# Patient Record
Sex: Female | Born: 1940
Health system: Southern US, Community
[De-identification: ages and names within clinical notes are randomized; demographics above are authoritative.]

## PROBLEM LIST (undated history)

## (undated) DIAGNOSIS — C50911 Malignant neoplasm of unspecified site of right female breast: Secondary | ICD-10-CM

## (undated) DIAGNOSIS — F419 Anxiety disorder, unspecified: Secondary | ICD-10-CM

## (undated) DIAGNOSIS — M1990 Unspecified osteoarthritis, unspecified site: Secondary | ICD-10-CM

## (undated) DIAGNOSIS — Z9221 Personal history of antineoplastic chemotherapy: Secondary | ICD-10-CM

## (undated) DIAGNOSIS — Z973 Presence of spectacles and contact lenses: Secondary | ICD-10-CM

## (undated) DIAGNOSIS — R002 Palpitations: Secondary | ICD-10-CM

## (undated) DIAGNOSIS — N393 Stress incontinence (female) (male): Secondary | ICD-10-CM

## (undated) DIAGNOSIS — Z1379 Encounter for other screening for genetic and chromosomal anomalies: Secondary | ICD-10-CM

## (undated) DIAGNOSIS — Z8489 Family history of other specified conditions: Secondary | ICD-10-CM

## (undated) DIAGNOSIS — Z803 Family history of malignant neoplasm of breast: Secondary | ICD-10-CM

## (undated) DIAGNOSIS — Z8042 Family history of malignant neoplasm of prostate: Secondary | ICD-10-CM

## (undated) DIAGNOSIS — F411 Generalized anxiety disorder: Secondary | ICD-10-CM

## (undated) DIAGNOSIS — N993 Prolapse of vaginal vault after hysterectomy: Secondary | ICD-10-CM

## (undated) DIAGNOSIS — N816 Rectocele: Secondary | ICD-10-CM

## (undated) DIAGNOSIS — H9192 Unspecified hearing loss, left ear: Secondary | ICD-10-CM

## (undated) DIAGNOSIS — L309 Dermatitis, unspecified: Secondary | ICD-10-CM

## (undated) HISTORY — PX: CARPAL TUNNEL RELEASE: SHX101

## (undated) HISTORY — DX: Family history of malignant neoplasm of prostate: Z80.42

## (undated) HISTORY — DX: Dermatitis, unspecified: L30.9

## (undated) HISTORY — PX: TUBAL LIGATION: SHX77

## (undated) HISTORY — DX: Anxiety disorder, unspecified: F41.9

## (undated) HISTORY — DX: Family history of malignant neoplasm of breast: Z80.3

## (undated) HISTORY — DX: Encounter for other screening for genetic and chromosomal anomalies: Z13.79

## (undated) HISTORY — PX: REDUCTION MAMMAPLASTY: SUR839

## (undated) HISTORY — PX: COLONOSCOPY: SHX5424

## (undated) HISTORY — PX: VAGINAL HYSTERECTOMY: SUR661

---

## 1999-08-18 ENCOUNTER — Encounter: Payer: Self-pay | Admitting: Obstetrics and Gynecology

## 1999-08-18 ENCOUNTER — Encounter: Admission: RE | Admit: 1999-08-18 | Discharge: 1999-08-18 | Payer: Self-pay | Admitting: Obstetrics and Gynecology

## 1999-10-06 ENCOUNTER — Emergency Department (HOSPITAL_COMMUNITY): Admission: EM | Admit: 1999-10-06 | Discharge: 1999-10-06 | Payer: Self-pay | Admitting: Emergency Medicine

## 2000-02-09 ENCOUNTER — Encounter (INDEPENDENT_AMBULATORY_CARE_PROVIDER_SITE_OTHER): Payer: Self-pay

## 2000-02-09 ENCOUNTER — Inpatient Hospital Stay (HOSPITAL_COMMUNITY): Admission: RE | Admit: 2000-02-09 | Discharge: 2000-02-10 | Payer: Self-pay | Admitting: Obstetrics and Gynecology

## 2000-02-09 HISTORY — PX: VAGINAL HYSTERECTOMY: SUR661

## 2000-08-09 ENCOUNTER — Other Ambulatory Visit: Admission: RE | Admit: 2000-08-09 | Discharge: 2000-08-09 | Payer: Self-pay | Admitting: Obstetrics and Gynecology

## 2000-08-24 ENCOUNTER — Encounter: Admission: RE | Admit: 2000-08-24 | Discharge: 2000-08-24 | Payer: Self-pay | Admitting: Obstetrics and Gynecology

## 2000-08-24 ENCOUNTER — Encounter: Payer: Self-pay | Admitting: Obstetrics and Gynecology

## 2001-08-29 ENCOUNTER — Encounter: Admission: RE | Admit: 2001-08-29 | Discharge: 2001-08-29 | Payer: Self-pay | Admitting: Obstetrics and Gynecology

## 2001-08-29 ENCOUNTER — Encounter: Payer: Self-pay | Admitting: Obstetrics and Gynecology

## 2001-10-20 ENCOUNTER — Other Ambulatory Visit: Admission: RE | Admit: 2001-10-20 | Discharge: 2001-10-20 | Payer: Self-pay | Admitting: Obstetrics and Gynecology

## 2003-06-27 ENCOUNTER — Other Ambulatory Visit: Admission: RE | Admit: 2003-06-27 | Discharge: 2003-06-27 | Payer: Self-pay | Admitting: Obstetrics and Gynecology

## 2003-07-03 ENCOUNTER — Ambulatory Visit (HOSPITAL_COMMUNITY): Admission: RE | Admit: 2003-07-03 | Discharge: 2003-07-03 | Payer: Self-pay | Admitting: Obstetrics and Gynecology

## 2004-10-03 ENCOUNTER — Ambulatory Visit: Payer: Self-pay | Admitting: Internal Medicine

## 2004-10-28 ENCOUNTER — Ambulatory Visit: Payer: Self-pay | Admitting: Internal Medicine

## 2004-10-29 ENCOUNTER — Ambulatory Visit (HOSPITAL_COMMUNITY): Admission: RE | Admit: 2004-10-29 | Discharge: 2004-10-29 | Payer: Self-pay | Admitting: Internal Medicine

## 2004-11-17 ENCOUNTER — Encounter: Admission: RE | Admit: 2004-11-17 | Discharge: 2004-11-17 | Payer: Self-pay | Admitting: Internal Medicine

## 2004-12-01 ENCOUNTER — Ambulatory Visit: Payer: Self-pay | Admitting: Internal Medicine

## 2005-03-04 ENCOUNTER — Ambulatory Visit: Payer: Self-pay | Admitting: Internal Medicine

## 2005-04-01 ENCOUNTER — Ambulatory Visit: Payer: Self-pay | Admitting: Internal Medicine

## 2005-04-30 ENCOUNTER — Ambulatory Visit: Payer: Self-pay | Admitting: *Deleted

## 2005-10-02 ENCOUNTER — Ambulatory Visit: Payer: Self-pay | Admitting: Internal Medicine

## 2006-01-26 ENCOUNTER — Ambulatory Visit: Payer: Self-pay | Admitting: Internal Medicine

## 2006-02-10 ENCOUNTER — Ambulatory Visit (HOSPITAL_COMMUNITY): Admission: RE | Admit: 2006-02-10 | Discharge: 2006-02-10 | Payer: Self-pay | Admitting: Internal Medicine

## 2006-03-15 ENCOUNTER — Ambulatory Visit: Payer: Self-pay | Admitting: Internal Medicine

## 2006-04-09 ENCOUNTER — Ambulatory Visit: Payer: Self-pay | Admitting: Internal Medicine

## 2006-04-13 HISTORY — PX: CARPAL TUNNEL RELEASE: SHX101

## 2006-06-17 ENCOUNTER — Ambulatory Visit: Payer: Self-pay | Admitting: Internal Medicine

## 2006-08-06 ENCOUNTER — Ambulatory Visit: Payer: Self-pay | Admitting: Internal Medicine

## 2006-12-29 ENCOUNTER — Encounter (INDEPENDENT_AMBULATORY_CARE_PROVIDER_SITE_OTHER): Payer: Self-pay | Admitting: *Deleted

## 2006-12-31 DIAGNOSIS — L719 Rosacea, unspecified: Secondary | ICD-10-CM | POA: Insufficient documentation

## 2006-12-31 DIAGNOSIS — M797 Fibromyalgia: Secondary | ICD-10-CM

## 2006-12-31 DIAGNOSIS — G4730 Sleep apnea, unspecified: Secondary | ICD-10-CM | POA: Insufficient documentation

## 2006-12-31 DIAGNOSIS — M26629 Arthralgia of temporomandibular joint, unspecified side: Secondary | ICD-10-CM | POA: Insufficient documentation

## 2006-12-31 DIAGNOSIS — M2550 Pain in unspecified joint: Secondary | ICD-10-CM | POA: Insufficient documentation

## 2006-12-31 DIAGNOSIS — F329 Major depressive disorder, single episode, unspecified: Secondary | ICD-10-CM | POA: Insufficient documentation

## 2006-12-31 DIAGNOSIS — G5600 Carpal tunnel syndrome, unspecified upper limb: Secondary | ICD-10-CM | POA: Insufficient documentation

## 2007-11-16 ENCOUNTER — Ambulatory Visit: Payer: Self-pay | Admitting: Vascular Surgery

## 2010-08-26 NOTE — Consult Note (Signed)
NEW PATIENT CONSULTATION   KARE, DADO  DOB:  24-Jun-1940                                       11/16/2007  ZOXWR#:[id number]   The patient presents today for evaluation of lower extremity edema.  She  is a pleasant 70 year old white female who complains of pain over her  right medial knee and popliteal fossa at the level of spider vein  telangiectasia.  She has had no prior history of deep venous thrombosis  or bleeding from her varicosities.  She does not have any swelling.   PAST MEDICAL HISTORY:  Her past history is significant for carpal tunnel  surgery and hysterectomy.  She does not have any other major medical  difficulties, specifically cardiac disease.  She has had prior treatment  of telangiectasia with what sounds like hypertonic saline since she did  have some discomfort associated with this.  She did not have any  untoward events at that time.   PHYSICAL EXAMINATION:  Constitutional:  A well-developed, well-nourished  white female appearing her stated age of 38.  Vital signs:  Her blood  pressure 118/78, pulse 84, respirations 18.  Vascular:  Her dorsalis  pedis pulses are 2+ bilaterally.  She does not have any leg swelling and  does not have any varicosities or changes of venous hypertension.  She  does have telangiectasia most particularly over the right medial knee  area.  She does have scattered smaller telangiectasis over both legs.   She underwent screening handheld duplex by me and this showed no  evidence of reflux in her saphenous vein.  I discussed the significance  of this with the patient.  I explained that in all likelihood her pain  would not be related to the telangiectasia and I suspect that these are  just simply in the same location as her pain.  I did explain the  treatment for the telangiectasia from a cosmetic standpoint would be  sclerotherapy which would not be covered by insurance.  I explained our  expected cost of  this and she wished to proceed with sclerotherapy  today.  After informed consent with 0.3% sodium tetradecyl I injected 2  mL in the most prominent tributary telangiectasia.  She had no immediate  complication and will be seen again in 1 month for continued followup.   Larina Earthly, M.D.  Electronically Signed   TFE/MEDQ  D:  11/16/2007  T:  11/17/2007  Job:  [id number]

## 2010-08-29 NOTE — H&P (Signed)
[facility]  Patient:    Lauren Holt, Lauren Holt                      MRN: [id number] Adm. Date:  02/09/00 Attending:  Erie Noe P. Pennie Rushing, M.D. Dictator:   Henreitta Leber, P.A.-C.                         History and Physical  DATE OF BIRTH:  [birth year]  HISTORY OF PRESENT ILLNESS:  This is 70 year old married white female, para 3-0-0-3 with a two-year history of symptomatic cystocele characterized by urinary hesitancy, pelvic pressure, nocturia, and occasional stress urinary incontinence.  Patient was evaluated by a urologist, Dr. Patsi Sears, for her symptoms and was advised to pursue surgical repair which would be best preserved with a hysterectomy done simultaneously.  Patient has therefore consented for both procedures.  PAST MEDICAL HISTORY:  OB history:  Patient had three full-term deliveries in 1973, 1974, and 1979 with her largest child weighing 9 pounds, 12 ounces. Gynecological history:   Menarche at 70 years of age.  Patients menstrual periods were regular until menopause.  She has no history of abnormal Pap smears with her last Pap smear in April 2001 returning normal as was her mammogram in May 2001 which also was normal.  Past surgical history: Bilateral tubal ligation.  Medical history:  Positive for depression and anxiety both responsive to Paxil and sinus infections.  FAMILY HISTORY:  Positive for heart disease, kidney infections, tuberculosis, diabetes, and rheumatoid arthritis.  CURRENT MEDICATIONS: 1. Estrace 1 mg per day. 2. Provera 2.5 mg per day. 3. Premarin vaginal cream one applicatorful per vagina daily. 4. Patient is currently taking an antibiotic of unknown name twice daily for    sinus infection. 5. A decongestant which she does not recall the name daily for sinus    congestion. 6. Paxil 20 mg.  ALLERGIES:  Patient has no known drug allergies.  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  HABITS:  She exercises regularly.  Her diet is regular.  She does not use tobacco or recreational drugs.  She drinks alcohol socially.  PHYSICAL EXAMINATION:  This is a well-nourished, well-developed white female in no acute distress.  REVIEW OF SYSTEMS:  Positive for insomnia, vaginal itching, sinus pressure, but negative for urinary urgency, frequency, hematuria, or recurrent urinary tract infections.  PHYSICAL EXAMINATION:  VITAL SIGNS:  Blood pressure is 122/74, weight is 148, height is 5 feet, 4-1/2 inches tall.  EAR, NOSE, AND THROAT:  Normal with the exception of left tympanic membrane which is retracted.  NECK:  Supple without masses or adenopathy.  LUNGS:  Without wheezing, rales, or rhonchi.  HEART:  Regular rate and rhythm.  ABDOMEN:  Bowel sounds are present.  It is soft, nontender.  BACK:  Without CVA tenderness.  PELVIC:  EGBUS is within normal limits.  Vagina is slightly atrophic with a second-degree cystocele and clumpy white vaginal discharge.  Cervix is without gross lesions.  There is a second-degree uterine descensus.  Uterus appears normal size, shape, consistency.  It is nontender and anterior.  Adnexa without palpable masses or tenderness.  Rectovaginal without masses or tenderness.  LABORATORY DATA:  Patient had a wet prep which revealed budding yeast.  IMPRESSION: 1. Symptomatic cystocele. 2. Candida vaginitis.  DISPOSITION:  A long discussion was held with the patient and Dr. Dierdre Forth concerning indications for hysterectomy as well  as the risks involved which include but are not limited to anesthesia, bleeding, infection, damage to adjacent organs, and possible recurrence of prolapse with loss of support. The patient seems to understand and wishes to proceed with total vaginal hysterectomy, bilateral salpingo-oophorectomy, and urologic repair - and she has consented for the same.  Patient was given Gynazole-I vaginal cream  to use one applicatorful per vagina stat for her yeast infection.  She will present at [facility] on Monday, October 29, for her procedures. DD:  02/06/00 TD:  02/06/00 Job: [id number]

## 2010-08-29 NOTE — Op Note (Signed)
[facility]  Patient:    Lauren Holt, Lauren Holt                      MRN: [id number] Adm. Date:  [id number] Attending:  Shaune Spittle                           Operative Report  NO DICTATION. DD:  02/09/00 TD:  02/09/00 Job: [id number]

## 2010-08-29 NOTE — Op Note (Signed)
[facility]  Patient:    Lauren Holt, Lauren Holt                      MRN: [id number] Proc. Date: 02/09/00 Adm. Date:  [id number] Attending:  Shaune Spittle CC:         Dr. Lupe Carney  Maris Berger. Pennie Rushing, M.D.   Operative Report  PREOPERATIVE DIAGNOSIS:  Pelvic relaxation with urinary incontinence.  POSTOPERATIVE DIAGNOSIS:  Pelvic relaxation with urinary incontinence.  OPERATION:  Anterior vaginal wall repair and pubovaginal sling.  SURGEON:  Sigmund I. Patsi Sears, M.D.  ANESTHESIA:  General endotracheal.  DESCRIPTION OF PROCEDURE:  With the patient in the dorsolithotomy position, the patient underwent vaginal hysterectomy per Dr. Pennie Rushing.  Following vaginal hysterectomy, the vaginal cuff was identified, and no bleeding was noted.  Ten cubic centimeters of 0.5 Marcaine solution with epinephrine 1:200,000 was injected into the pubovaginal cervical arch tissue and following preparation of the 4 cm x 7 cm Tutoplast fascia, an incision was made in a semilunar manner from the 8 oclock to the 12 oclock to the 4 oclock position.  The subcutaneous tissue was dissected with sharp and blunt dissection, and following this, the retropubic space was dissected bilaterally.  Using the transvaginal anchor system, the anchor and #1 Prolene sutures were placed, and the preprepared 7 cm portion of the Tutoplast fascia was then back loaded on to the sutures and placed as a fascial sling.  The bib portion of the fascia was then brought down over the anterior repair and sutured in place with 3-0 Vicryl suture.  The sling was ligated in place with care taken to avoid any over tightening of the sling, as demonstrated with a right angle clamp.  The wound was irrigated with antibiotic irrigation and closed in multiple layers with 3-0 Vicryl suture.  The knots were buried for the patients benefit.  Following closure, the vagina was packed with 2 inch Iodoform  packing and Estrace cream.  Hysteroscopy was accomplished and showed no suture within the bladder.  The bladder neck was opened.  The patient tolerated the procedure well, was awakened and given IV Toradol, and taken to the recovery room in good condition. DD:  02/09/00 TD:  02/09/00 Job: [id number]

## 2010-08-29 NOTE — Discharge Summary (Signed)
[facility]  Patient:    Lauren Holt, Lauren Holt                      MRN: [id number] Adm. Date:  [id number] Disc. Date: [id number] Attending:  Shaune Spittle Dictator:   Henreitta Leber, P.A.                           Discharge Summary  DISCHARGE DIAGNOSES: 1. Pelvic relaxation. 2. Urinary incontinence. 3. Pelvic adhesions.  PROCEDURE:  On date of admission the patient underwent a total vaginal hysterectomy with lysis of adhesions by Dr. Maris Berger. Haygood and the placement of a pubovaginal sling by Dr. Jethro Bolus.  HISTORY OF PRESENT ILLNESS:  This is a 70 year old married white female, para 3-0-0-3 with a two-year history of a symptomatic cystocele characterized by urinary hesitancy, pelvic pressure, nocturia, and occasional stress urinary incontinence.  The patient has consented to undergo a total vaginal hysterectomy with the placement of a pubovaginal sling in an effort to alleviate her symptoms.  Please see the patients dictated History and Physical Examination for details.  PHYSICAL EXAMINATION ON ADMISSION:  VITAL SIGNS:  Blood pressure is 122/74, weight is 148, height is 5 feet 4-1/2 inches tall.  GENERAL:  General exam was within normal limits.  PELVIC:  EGBUS within normal limits.  Vagina is slightly atrophic with second degree cystocele and clumpy white vaginal discharge.  Cervix is without gross lesions and a second degree uterine descensus.  Uterus normal size, shape, consistency, nontender, anterior.  Adnexa without tenderness or masses. Rectovaginal without tenderness or masses.  HOSPITAL COURSE:  On day of admission the patient underwent a total vaginal hysterectomy with lysis of adhesions and placement of a pubovaginal tolerating all procedures well.  Her postoperative course was unremarkable with her quickly resuming bowel and bladder function by postop day #2 and deemed ready for discharge.  Postop hemoglobin was 10  (preop hemoglobin 13.1).  DISCHARGE MEDICATIONS: 1. Vicodin one to two tablets q.4h. p.r.n. pain. 2. Ferrous sulfate 325 mg one tablet q.d. 3. Colace as directed. 4. Usual medications.  FOLLOWUP:  The patient is to be contacted by Dr. Hollie Beach office to schedule a follow-up visit.  She is to follow up with Dr. Maris Berger. Haygood on March 22, 2000, at 2:30 p.m. for her postoperative exam.  DISCHARGE INSTRUCTIONS:  The patient was given a copy of [facility] postoperative instruction sheet.  She  was further advised to avoid driving for two weeks, heavy lifting for four weeks, and intercourse for six weeks.  The patient also was reminded that she may call the office at any time for any questions or concerns.  LABORATORY:  Final pathology was not available at the time of patients discharge. DD:  02/10/00 TD:  02/11/00 Job: [id number]

## 2011-10-06 ENCOUNTER — Encounter: Payer: Self-pay | Admitting: Vascular Surgery

## 2012-02-16 ENCOUNTER — Other Ambulatory Visit: Payer: Self-pay | Admitting: Family Medicine

## 2012-02-16 DIAGNOSIS — Z1231 Encounter for screening mammogram for malignant neoplasm of breast: Secondary | ICD-10-CM

## 2012-03-22 ENCOUNTER — Ambulatory Visit
Admission: RE | Admit: 2012-03-22 | Discharge: 2012-03-22 | Disposition: A | Discharge status: Home / Self Care | Payer: Medicare Other | Source: Ambulatory Visit | Attending: Family Medicine | Admitting: Family Medicine

## 2012-03-22 DIAGNOSIS — Z1231 Encounter for screening mammogram for malignant neoplasm of breast: Secondary | ICD-10-CM

## 2012-03-28 ENCOUNTER — Other Ambulatory Visit: Payer: Self-pay | Admitting: Family Medicine

## 2012-03-28 DIAGNOSIS — R928 Other abnormal and inconclusive findings on diagnostic imaging of breast: Secondary | ICD-10-CM

## 2012-04-11 ENCOUNTER — Other Ambulatory Visit: Payer: Medicare Other

## 2012-04-19 ENCOUNTER — Ambulatory Visit: Payer: Medicare Other

## 2012-04-19 ENCOUNTER — Ambulatory Visit
Admission: RE | Admit: 2012-04-19 | Discharge: 2012-04-19 | Disposition: A | Discharge status: Home / Self Care | Payer: Medicare Other | Source: Ambulatory Visit | Attending: Family Medicine | Admitting: Family Medicine

## 2012-04-19 DIAGNOSIS — R928 Other abnormal and inconclusive findings on diagnostic imaging of breast: Secondary | ICD-10-CM

## 2012-06-08 ENCOUNTER — Ambulatory Visit: Payer: Medicare Other

## 2012-06-13 ENCOUNTER — Encounter: Payer: Self-pay | Admitting: Surgery

## 2012-06-20 ENCOUNTER — Encounter: Payer: Self-pay | Admitting: Surgery

## 2012-07-05 ENCOUNTER — Encounter: Payer: Self-pay | Admitting: *Deleted

## 2012-07-06 ENCOUNTER — Ambulatory Visit (INDEPENDENT_AMBULATORY_CARE_PROVIDER_SITE_OTHER): Payer: Medicare Other | Admitting: *Deleted

## 2012-07-06 DIAGNOSIS — I781 Nevus, non-neoplastic: Secondary | ICD-10-CM

## 2012-07-06 NOTE — Progress Notes (Signed)
X=.3% Sotradecol administered with a 27g butterfly.  Patient received a total of 6cc.  Treated all areas of concern with one syringe. Tol well. Easy access for majority of the vessels. Antic good results. Follow prn.  Photos: yes  Compression stockings applied: yes

## 2014-05-28 ENCOUNTER — Other Ambulatory Visit (HOSPITAL_COMMUNITY): Payer: Self-pay | Admitting: Family Medicine

## 2014-05-28 DIAGNOSIS — R102 Pelvic and perineal pain: Secondary | ICD-10-CM

## 2014-05-28 DIAGNOSIS — R1032 Left lower quadrant pain: Secondary | ICD-10-CM | POA: Diagnosis not present

## 2014-05-29 ENCOUNTER — Ambulatory Visit (HOSPITAL_COMMUNITY)
Admission: RE | Admit: 2014-05-29 | Discharge: 2014-05-29 | Disposition: A | Discharge status: Home / Self Care | Payer: Commercial Managed Care - HMO | Source: Ambulatory Visit | Attending: Family Medicine | Admitting: Family Medicine

## 2014-05-29 DIAGNOSIS — R102 Pelvic and perineal pain: Secondary | ICD-10-CM | POA: Diagnosis not present

## 2014-05-29 DIAGNOSIS — Z90710 Acquired absence of both cervix and uterus: Secondary | ICD-10-CM | POA: Insufficient documentation

## 2014-08-09 DIAGNOSIS — J321 Chronic frontal sinusitis: Secondary | ICD-10-CM | POA: Diagnosis not present

## 2014-09-07 ENCOUNTER — Other Ambulatory Visit: Payer: Self-pay | Admitting: Family Medicine

## 2014-09-07 DIAGNOSIS — N63 Unspecified lump in unspecified breast: Secondary | ICD-10-CM

## 2014-09-07 DIAGNOSIS — N6001 Solitary cyst of right breast: Secondary | ICD-10-CM

## 2014-09-07 DIAGNOSIS — L719 Rosacea, unspecified: Secondary | ICD-10-CM | POA: Diagnosis not present

## 2014-09-07 DIAGNOSIS — N61 Inflammatory disorders of breast: Secondary | ICD-10-CM | POA: Diagnosis not present

## 2014-09-07 DIAGNOSIS — N644 Mastodynia: Secondary | ICD-10-CM

## 2014-09-24 ENCOUNTER — Ambulatory Visit
Admission: RE | Admit: 2014-09-24 | Discharge: 2014-09-24 | Disposition: A | Discharge status: Home / Self Care | Payer: Commercial Managed Care - HMO | Source: Ambulatory Visit | Attending: Family Medicine | Admitting: Family Medicine

## 2014-09-24 DIAGNOSIS — N644 Mastodynia: Secondary | ICD-10-CM | POA: Diagnosis not present

## 2014-09-24 DIAGNOSIS — N63 Unspecified lump in unspecified breast: Secondary | ICD-10-CM

## 2014-09-24 DIAGNOSIS — N6001 Solitary cyst of right breast: Secondary | ICD-10-CM

## 2014-09-24 DIAGNOSIS — R928 Other abnormal and inconclusive findings on diagnostic imaging of breast: Secondary | ICD-10-CM | POA: Diagnosis not present

## 2014-10-05 DIAGNOSIS — N61 Inflammatory disorders of breast: Secondary | ICD-10-CM | POA: Diagnosis not present

## 2014-11-19 DIAGNOSIS — N644 Mastodynia: Secondary | ICD-10-CM | POA: Diagnosis not present

## 2014-11-19 DIAGNOSIS — R300 Dysuria: Secondary | ICD-10-CM | POA: Diagnosis not present

## 2014-11-22 ENCOUNTER — Other Ambulatory Visit: Payer: Commercial Managed Care - HMO

## 2014-11-23 ENCOUNTER — Other Ambulatory Visit: Payer: Commercial Managed Care - HMO

## 2014-11-26 ENCOUNTER — Other Ambulatory Visit: Payer: Commercial Managed Care - HMO

## 2014-11-29 ENCOUNTER — Ambulatory Visit
Admission: RE | Admit: 2014-11-29 | Discharge: 2014-11-29 | Disposition: A | Discharge status: Home / Self Care | Payer: Commercial Managed Care - HMO | Source: Ambulatory Visit | Attending: Family Medicine | Admitting: Family Medicine

## 2014-11-29 ENCOUNTER — Other Ambulatory Visit: Payer: Self-pay | Admitting: Family Medicine

## 2014-11-29 DIAGNOSIS — N6001 Solitary cyst of right breast: Secondary | ICD-10-CM

## 2014-11-29 DIAGNOSIS — N644 Mastodynia: Secondary | ICD-10-CM | POA: Diagnosis not present

## 2014-11-29 DIAGNOSIS — N63 Unspecified lump in unspecified breast: Secondary | ICD-10-CM

## 2014-12-12 DIAGNOSIS — R350 Frequency of micturition: Secondary | ICD-10-CM | POA: Diagnosis not present

## 2015-03-13 DIAGNOSIS — Z23 Encounter for immunization: Secondary | ICD-10-CM | POA: Diagnosis not present

## 2015-03-13 DIAGNOSIS — N649 Disorder of breast, unspecified: Secondary | ICD-10-CM | POA: Diagnosis not present

## 2015-03-13 DIAGNOSIS — F419 Anxiety disorder, unspecified: Secondary | ICD-10-CM | POA: Diagnosis not present

## 2015-03-18 ENCOUNTER — Ambulatory Visit: Payer: Self-pay | Admitting: Surgery

## 2015-03-18 DIAGNOSIS — N644 Mastodynia: Secondary | ICD-10-CM | POA: Diagnosis not present

## 2015-03-26 ENCOUNTER — Other Ambulatory Visit: Payer: Self-pay | Admitting: Surgery

## 2015-03-26 DIAGNOSIS — N644 Mastodynia: Secondary | ICD-10-CM

## 2015-03-26 DIAGNOSIS — R609 Edema, unspecified: Secondary | ICD-10-CM

## 2015-04-16 ENCOUNTER — Inpatient Hospital Stay: Admission: RE | Admit: 2015-04-16 | Payer: Commercial Managed Care - HMO | Source: Ambulatory Visit

## 2015-06-24 DIAGNOSIS — J329 Chronic sinusitis, unspecified: Secondary | ICD-10-CM | POA: Diagnosis not present

## 2015-07-12 DIAGNOSIS — J309 Allergic rhinitis, unspecified: Secondary | ICD-10-CM | POA: Diagnosis not present

## 2015-07-12 DIAGNOSIS — R350 Frequency of micturition: Secondary | ICD-10-CM | POA: Diagnosis not present

## 2015-07-12 DIAGNOSIS — N3000 Acute cystitis without hematuria: Secondary | ICD-10-CM | POA: Diagnosis not present

## 2015-07-12 DIAGNOSIS — L719 Rosacea, unspecified: Secondary | ICD-10-CM | POA: Diagnosis not present

## 2015-08-06 DIAGNOSIS — R300 Dysuria: Secondary | ICD-10-CM | POA: Diagnosis not present

## 2015-08-06 DIAGNOSIS — L719 Rosacea, unspecified: Secondary | ICD-10-CM | POA: Diagnosis not present

## 2015-08-06 DIAGNOSIS — R8290 Unspecified abnormal findings in urine: Secondary | ICD-10-CM | POA: Diagnosis not present

## 2015-09-02 ENCOUNTER — Inpatient Hospital Stay: Admission: RE | Admit: 2015-09-02 | Payer: Commercial Managed Care - HMO | Source: Ambulatory Visit

## 2015-09-02 ENCOUNTER — Other Ambulatory Visit: Payer: Self-pay

## 2015-09-02 DIAGNOSIS — Z1231 Encounter for screening mammogram for malignant neoplasm of breast: Secondary | ICD-10-CM

## 2015-09-23 DIAGNOSIS — R300 Dysuria: Secondary | ICD-10-CM | POA: Diagnosis not present

## 2015-09-23 DIAGNOSIS — N3000 Acute cystitis without hematuria: Secondary | ICD-10-CM | POA: Diagnosis not present

## 2015-09-26 ENCOUNTER — Ambulatory Visit
Admission: RE | Admit: 2015-09-26 | Discharge: 2015-09-26 | Disposition: A | Discharge status: Home / Self Care | Payer: Commercial Managed Care - HMO | Source: Ambulatory Visit

## 2015-09-26 ENCOUNTER — Other Ambulatory Visit: Payer: Self-pay | Admitting: Family Medicine

## 2015-09-26 ENCOUNTER — Ambulatory Visit: Payer: Commercial Managed Care - HMO

## 2015-09-26 DIAGNOSIS — Z1231 Encounter for screening mammogram for malignant neoplasm of breast: Secondary | ICD-10-CM

## 2015-09-30 ENCOUNTER — Ambulatory Visit
Admission: RE | Admit: 2015-09-30 | Discharge: 2015-09-30 | Disposition: A | Discharge status: Home / Self Care | Payer: Commercial Managed Care - HMO | Source: Ambulatory Visit | Attending: Surgery | Admitting: Surgery

## 2015-09-30 DIAGNOSIS — R609 Edema, unspecified: Secondary | ICD-10-CM

## 2015-09-30 DIAGNOSIS — N63 Unspecified lump in breast: Secondary | ICD-10-CM | POA: Diagnosis not present

## 2015-09-30 DIAGNOSIS — N644 Mastodynia: Secondary | ICD-10-CM

## 2015-09-30 MED ORDER — GADOBENATE DIMEGLUMINE 529 MG/ML IV SOLN
17.0000 mL | Freq: Once | INTRAVENOUS | Status: AC | PRN
  Administered 2015-09-30: 17 mL via INTRAVENOUS

## 2015-11-13 DIAGNOSIS — H5210 Myopia, unspecified eye: Secondary | ICD-10-CM | POA: Diagnosis not present

## 2015-11-13 DIAGNOSIS — H52203 Unspecified astigmatism, bilateral: Secondary | ICD-10-CM | POA: Diagnosis not present

## 2015-12-10 DIAGNOSIS — M545 Low back pain: Secondary | ICD-10-CM | POA: Diagnosis not present

## 2015-12-10 DIAGNOSIS — H9209 Otalgia, unspecified ear: Secondary | ICD-10-CM | POA: Diagnosis not present

## 2016-10-28 DIAGNOSIS — Z0000 Encounter for general adult medical examination without abnormal findings: Secondary | ICD-10-CM | POA: Diagnosis not present

## 2016-10-28 DIAGNOSIS — R635 Abnormal weight gain: Secondary | ICD-10-CM | POA: Diagnosis not present

## 2016-10-28 DIAGNOSIS — M8580 Other specified disorders of bone density and structure, unspecified site: Secondary | ICD-10-CM | POA: Diagnosis not present

## 2016-10-28 DIAGNOSIS — L719 Rosacea, unspecified: Secondary | ICD-10-CM | POA: Diagnosis not present

## 2016-10-28 DIAGNOSIS — F419 Anxiety disorder, unspecified: Secondary | ICD-10-CM | POA: Diagnosis not present

## 2016-10-28 DIAGNOSIS — E7800 Pure hypercholesterolemia, unspecified: Secondary | ICD-10-CM | POA: Diagnosis not present

## 2016-12-07 DIAGNOSIS — M8588 Other specified disorders of bone density and structure, other site: Secondary | ICD-10-CM | POA: Diagnosis not present

## 2017-02-01 DIAGNOSIS — Z86010 Personal history of colonic polyps: Secondary | ICD-10-CM | POA: Diagnosis not present

## 2017-03-11 DIAGNOSIS — H04123 Dry eye syndrome of bilateral lacrimal glands: Secondary | ICD-10-CM | POA: Diagnosis not present

## 2017-05-10 ENCOUNTER — Other Ambulatory Visit: Payer: Self-pay | Admitting: Family Medicine

## 2017-05-10 DIAGNOSIS — Z139 Encounter for screening, unspecified: Secondary | ICD-10-CM

## 2017-05-25 DIAGNOSIS — J069 Acute upper respiratory infection, unspecified: Secondary | ICD-10-CM | POA: Diagnosis not present

## 2017-06-11 DIAGNOSIS — C50311 Malignant neoplasm of lower-inner quadrant of right female breast: Secondary | ICD-10-CM

## 2017-06-11 HISTORY — DX: Estrogen receptor positive status (ER+): C50.311

## 2017-06-11 HISTORY — PX: BREAST BIOPSY: SHX20

## 2017-06-14 ENCOUNTER — Ambulatory Visit
Admission: RE | Admit: 2017-06-14 | Discharge: 2017-06-14 | Disposition: A | Discharge status: Home / Self Care | Payer: Commercial Managed Care - HMO | Source: Ambulatory Visit | Attending: Family Medicine | Admitting: Family Medicine

## 2017-06-14 ENCOUNTER — Other Ambulatory Visit: Payer: Self-pay | Admitting: Family Medicine

## 2017-06-14 DIAGNOSIS — Z139 Encounter for screening, unspecified: Secondary | ICD-10-CM

## 2017-06-14 DIAGNOSIS — Z1231 Encounter for screening mammogram for malignant neoplasm of breast: Secondary | ICD-10-CM | POA: Diagnosis not present

## 2017-06-16 ENCOUNTER — Other Ambulatory Visit: Payer: Self-pay | Admitting: Family Medicine

## 2017-06-16 DIAGNOSIS — R928 Other abnormal and inconclusive findings on diagnostic imaging of breast: Secondary | ICD-10-CM

## 2017-06-18 DIAGNOSIS — H2513 Age-related nuclear cataract, bilateral: Secondary | ICD-10-CM | POA: Diagnosis not present

## 2017-06-18 DIAGNOSIS — H5203 Hypermetropia, bilateral: Secondary | ICD-10-CM | POA: Diagnosis not present

## 2017-06-18 DIAGNOSIS — H52223 Regular astigmatism, bilateral: Secondary | ICD-10-CM | POA: Diagnosis not present

## 2017-06-18 DIAGNOSIS — H524 Presbyopia: Secondary | ICD-10-CM | POA: Diagnosis not present

## 2017-06-23 ENCOUNTER — Other Ambulatory Visit: Payer: Self-pay | Admitting: Family Medicine

## 2017-06-23 ENCOUNTER — Ambulatory Visit
Admission: RE | Admit: 2017-06-23 | Discharge: 2017-06-23 | Disposition: A | Discharge status: Home / Self Care | Payer: Medicare HMO | Source: Ambulatory Visit | Attending: Family Medicine | Admitting: Family Medicine

## 2017-06-23 DIAGNOSIS — R921 Mammographic calcification found on diagnostic imaging of breast: Secondary | ICD-10-CM

## 2017-06-23 DIAGNOSIS — R928 Other abnormal and inconclusive findings on diagnostic imaging of breast: Secondary | ICD-10-CM

## 2017-06-29 ENCOUNTER — Ambulatory Visit
Admission: RE | Admit: 2017-06-29 | Discharge: 2017-06-29 | Disposition: A | Discharge status: Home / Self Care | Payer: Medicare HMO | Source: Ambulatory Visit | Attending: Family Medicine | Admitting: Family Medicine

## 2017-06-29 ENCOUNTER — Other Ambulatory Visit: Payer: Self-pay | Admitting: Family Medicine

## 2017-06-29 DIAGNOSIS — R921 Mammographic calcification found on diagnostic imaging of breast: Secondary | ICD-10-CM

## 2017-06-29 DIAGNOSIS — C50111 Malignant neoplasm of central portion of right female breast: Secondary | ICD-10-CM | POA: Diagnosis not present

## 2017-06-29 DIAGNOSIS — D0511 Intraductal carcinoma in situ of right breast: Secondary | ICD-10-CM | POA: Diagnosis not present

## 2017-06-29 DIAGNOSIS — R920 Mammographic microcalcification found on diagnostic imaging of breast: Secondary | ICD-10-CM | POA: Diagnosis not present

## 2017-06-30 ENCOUNTER — Other Ambulatory Visit: Payer: Self-pay | Admitting: Family Medicine

## 2017-06-30 ENCOUNTER — Encounter: Payer: Self-pay | Admitting: *Deleted

## 2017-06-30 ENCOUNTER — Telehealth: Payer: Self-pay | Admitting: Hematology and Oncology

## 2017-06-30 DIAGNOSIS — C50911 Malignant neoplasm of unspecified site of right female breast: Secondary | ICD-10-CM

## 2017-06-30 NOTE — Telephone Encounter (Signed)
LVM for patient to confirm morning [facility] appointment for 3/27, letter given to [facility] to give to patient

## 2017-07-01 ENCOUNTER — Ambulatory Visit
Admission: RE | Admit: 2017-07-01 | Discharge: 2017-07-01 | Disposition: A | Discharge status: Home / Self Care | Payer: Medicare HMO | Source: Ambulatory Visit | Attending: Family Medicine | Admitting: Family Medicine

## 2017-07-01 DIAGNOSIS — D0511 Intraductal carcinoma in situ of right breast: Secondary | ICD-10-CM | POA: Diagnosis not present

## 2017-07-01 DIAGNOSIS — C50911 Malignant neoplasm of unspecified site of right female breast: Secondary | ICD-10-CM

## 2017-07-02 ENCOUNTER — Other Ambulatory Visit: Payer: Self-pay | Admitting: *Deleted

## 2017-07-02 DIAGNOSIS — C50311 Malignant neoplasm of lower-inner quadrant of right female breast: Secondary | ICD-10-CM | POA: Insufficient documentation

## 2017-07-02 DIAGNOSIS — Z170 Estrogen receptor positive status [ER+]: Principal | ICD-10-CM

## 2017-07-06 NOTE — Progress Notes (Signed)
[facility]  [phone] [phone]) [phone]     ID: Lauren Holt DOB: 03-Aug-1940  MR#: [id number]  GTX#:[id number]  Patient Care Team: Alroy Dust, Carlean Jews.Marlou Sa, MD as PCP - General (Family Medicine) Jovita Kussmaul, MD as Consulting Physician (General Surgery) Magrinat, Virgie Dad, MD as Consulting Physician (Oncology) Kyung Rudd, MD as Consulting Physician (Radiation Oncology) OTHER MD:  CHIEF COMPLAINT: Estrogen receptor positive breast cancer  CURRENT TREATMENT: Definitive surgery pending   HISTORY OF CURRENT ILLNESS: Lauren Holt had routine screening mammography on 06/14/2017 showing a possible area of calcifications in the right breast. She underwent unilateral right diagnostic mammography with tomography at [facility] on 06/23/2017 showing: Suspicious calcifications within the inferior right breast, spanning nearly 10 cm, more superior component spanning 4 cm extent. The more superior component may have a small associated mass. Ultrasonography of the right axilla on 07/01/2017 showed normal right axillary lymph nodes.   Accordingly on 06/29/2017 she proceeded to biopsy of the right breast area in question. The pathology from this procedure showed ([id number]): In the right breast lower central, more lateral superior: invasive ductal carcinoma grade I. In the lower central, more medial inferior: Ductal carcinoma in situ, high grade, with calcifications. The  invasive tumor was significant for estrogen receptor, 100% positive and progesterone receptor, 10% positive, both with strong staining intensity. Proliferation marker Ki67 at 5%. HER2  not amplified. The ductal carcinoma in situ was significant for estrogen receptor, 100% positive and progesterone receptor, 80% positive, both with strong staining intensity.   The patient's subsequent history is as detailed below.  INTERVAL HISTORY: Lauren Holt was evaluated in the multidisciplinary breast cancer  clinic on 07/06/2017 accompanied by her daughter, Lauren Holt. Her case was also presented at the multidisciplinary breast cancer conference on the same day. At that time a preliminary plan was proposed: Biopsy of the area between the prior biopsies for definitive surgical planning; consider MRI; consider Oncotype versus hormones only depending on patient's overall status; consider adjuvant radiation   REVIEW OF SYSTEMS: There were no specific symptoms leading to the original mammogram, which was routinely scheduled. She is still deciding weather to have a lumpectomy or mastectomy. The patient denies unusual headaches, visual changes, nausea, vomiting, stiff neck, dizziness, or gait imbalance. There has been no cough, phlegm production, or pleurisy, no chest pain or pressure, and no change in bowel or bladder habits. The patient denies fever, rash, bleeding, unexplained fatigue or unexplained weight loss. A detailed review of systems was otherwise entirely negative.  PAST MEDICAL HISTORY: Past Medical History:  Diagnosis Date  . Anxiety   . Dermatitis    HLD ( but not too high)   PAST SURGICAL HISTORY: Past Surgical History:  Procedure Laterality Date  . ABDOMINAL HYSTERECTOMY     Partial Hysterectomy without BSO. Because they were too close to the bladder.    FAMILY HISTORY Family History  Problem Relation Age of Onset  . Breast cancer Paternal Aunt   . Breast cancer Cousin    The patient's father died at age 18 due to emphysema. The patient's mother is alive at age 45 as of March 2019. The patient has 5 brothers and 3 sisters. There was a paternal great aunt with breast cancer. There was also a paternal cousin with breast cancer.   GYNECOLOGIC HISTORY:  No LMP recorded. Patient has had a hysterectomy. Menarche: 77 years old Age at first live birth: 77 years old The patient is GXP3. She is s/p partial hysterectomy. Both of her  ovaries remain. She used oral contraceptives with no  complications. She also used HRT for 2 years without complications.     SOCIAL HISTORY:  Lauren Holt is now retired. 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 She is divorced. Her oldest daughter, Lauren Holt", is a [employer]th Land in [place], [place]. The patient's son, Lauren Holt, works in a Proofreader in [place]. The patient's son, Lauren Holt, works in Quarry manager in [place].  The patient has 4 grandchildren. She currently does not belong to a church.      ADVANCED DIRECTIVES: Not in place.  At the 07/07/2017 visit the patient was given the appropriate documents to complete and notarized at her discretion.  HEALTH MAINTENANCE: Social History   Tobacco Use  . Smoking status: Never Smoker  . Smokeless tobacco: Never Used  Substance Use Topics  . Alcohol use: Yes  . Drug use: Never     Colonoscopy: 02/2017 at [facility]  PAP:   Bone density: 2018/ osteopenia   No Known Allergies  Current Outpatient Medications  Medication Sig Dispense Refill  . Ascorbic Acid (VITAMIN C) 500 MG/5ML LIQD 1 tablet    . aspirin 325 MG tablet 1 tablet    . calcium gluconate in sodium chloride 0.9 % 25 mL 1 tablet after meals    . Omega-3 Fatty Acids (FISH OIL) 500 MG CAPS 1 capsule    . Pediatric Multivitamins-Fl (MULTIVITAMINS/FL PO) 1 tablet    . Red Yeast Rice 600 MG CAPS 1 or 2 capsules     No current facility-administered medications for this visit.     OBJECTIVE: Middle-aged white woman who appears well  Vitals:   07/07/17 1310  BP: 123/69  Pulse: 85  Resp: 18  Temp: 98.6 F (37 C)  SpO2: 98%     Body mass index is 30.97 kg/m.   Wt Readings from Last 3 Encounters:  07/07/17 177 lb 9.6 oz (80.6 kg)      ECOG FS:0 - Asymptomatic  Ocular: Sclerae unicteric, pupils round and equal Ear-nose-throat: Oropharynx clear and moist Lymphatic: No cervical or supraclavicular adenopathy Lungs no rales or rhonchi Heart regular rate and  rhythm Abd soft, nontender, positive bowel sounds MSK no focal spinal tenderness, no joint edema Neuro: non-focal, well-oriented, appropriate affect Breasts: The right breast shows a minimal ecchymosis.  There is no palpable mass.  The left breast is benign.  Both axillae are benign.   LAB RESULTS:  CMP  No results found for: NA, K, CL, CO2, GLUCOSE, BUN, CREATININE, CALCIUM, PROT, ALBUMIN, AST, ALT, ALKPHOS, BILITOT, GFRNONAA, GFRAA  No results found for: TOTALPROTELP, ALBUMINELP, A1GS, A2GS, BETS, BETA2SER, GAMS, MSPIKE, SPEI  No results found for: KPAFRELGTCHN, LAMBDASER, KAPLAMBRATIO  No results found for: WBC, NEUTROABS, HGB, HCT, MCV, PLT  _0 @  No results found for: LABCA2  No components found for: ALPFXT024  No results for input(s): INR in the last 168 hours.  No results found for: LABCA2  No results found for: OXB353  No results found for: GDJ242  No results found for: AST419  No results found for: CA2729  No components found for: HGQUANT  No results found for: CEA1 / No results found for: CEA1   No results found for: AFPTUMOR  No results found for: CHROMOGRNA  No results found for: PSA1  No visits with results within 3 Day(s) from this visit.  Latest known visit with results is:  No results found for any previous  visit.    (this displays the last labs from the last 3 days)  No results found for: TOTALPROTELP, ALBUMINELP, A1GS, A2GS, BETS, BETA2SER, GAMS, MSPIKE, SPEI (this displays SPEP labs)  No results found for: KPAFRELGTCHN, LAMBDASER, KAPLAMBRATIO (kappa/lambda light chains)  No results found for: HGBA, HGBA2QUANT, HGBFQUANT, HGBSQUAN (Hemoglobinopathy evaluation)   No results found for: LDH  No results found for: IRON, TIBC, IRONPCTSAT (Iron and TIBC)  No results found for: FERRITIN  Urinalysis No results found for: COLORURINE, APPEARANCEUR, LABSPEC, PHURINE, GLUCOSEU, HGBUR, BILIRUBINUR, KETONESUR, PROTEINUR,  UROBILINOGEN, NITRITE, LEUKOCYTESUR   STUDIES: Mm Digital Diagnostic Unilat R  Result Date: 06/23/2017 CLINICAL DATA:  Patient returns today to evaluate right breast calcifications identified on recent screening mammogram. EXAM: DIGITAL DIAGNOSTIC RIGHT MAMMOGRAM WITH CAD COMPARISON:  Previous exams including recent screening mammogram dated 06/14/2017. ACR Breast Density Category c: The breast tissue is heterogeneously dense, which may obscure small masses. FINDINGS: On today's additional magnification views, 2 nearby groups of suspicious calcifications are confirmed within the lower right breast, with an overall segmental distribution. The more inferior group of pleomorphic and amorphous calcifications have an overall segmental distribution spanning 9.7 cm extent from posterior to anterior depth, with a suspicious linear/ductal component posteriorly. The most suspicious components are seen at its most posterior and anterior aspects. The slightly more superior group of pleomorphic and amorphous calcifications span approximately 4 cm extent. The most suspicious component is seen at its posterior aspect, with possible small associated mass. Mammographic images were processed with CAD. IMPRESSION: Suspicious calcifications within the inferior right breast, anterior to posterior extent, overall segmental distribution, more inferior component spanning nearly 10 cm, more superior component spanning 4 cm extent. The more superior component may have a small associated mass. Stereotactic biopsy recommended for 2 sites. RECOMMENDATION: 1. Stereotactic biopsy of the slightly more superior-lateral group of pleomorphic and amorphous calcifications within the lower right breast, spanning 4 cm extent. Recommend biopsy sampling of the the most suspicious component which is at its posterior aspect, with possible associated mass. 2. Stereotactic biopsy of the more inferior group of pleomorphic and amorphous calcifications,  spanning 9.7 cm extent from posterior to anterior depth. Recommend biopsy at either the most posterior or anterior extent of these calcifications. The most anterior calcifications, however, may not be accessible with stereotactic biopsy. Stereotactic biopsies are scheduled for March 19th. I have discussed the findings and recommendations with the patient. Results were also provided in writing at the conclusion of the visit. If applicable, a reminder letter will be sent to the patient regarding the next appointment. BI-RADS CATEGORY  4: Suspicious. Electronically Signed   By: Franki Cabot M.D.   On: 06/23/2017 13:10   Mm Screening Breast Tomo Bilateral  Result Date: 06/15/2017 CLINICAL DATA:  Screening. EXAM: DIGITAL SCREENING BILATERAL MAMMOGRAM WITH TOMO AND CAD COMPARISON:  Previous exam(s). ACR Breast Density Category c: The breast tissue is heterogeneously dense, which may obscure small masses. FINDINGS: In the right breast, calcifications warrant further evaluation with magnified views. In the left breast, no findings suspicious for malignancy. Images were processed with CAD. IMPRESSION: Further evaluation is suggested for calcifications in the right breast. RECOMMENDATION: Diagnostic mammogram of the right breast. (Code:[id number]) The patient will be contacted regarding the findings, and additional imaging will be scheduled. BI-RADS CATEGORY  0: Incomplete. Need additional imaging evaluation and/or prior mammograms for comparison. Electronically Signed   By: Evangeline Dakin M.D.   On: 06/15/2017 07:43   [place] Axilla Right  Result Date: 07/01/2017 CLINICAL  DATA:  77 year old patient recently diagnosed with ductal carcinoma in situ and invasive ductal carcinoma following stereotactic biopsy of 2 areas in the right breast. She presents for ultrasound evaluation of the right axilla. EXAM: ULTRASOUND OF THE RIGHT AXILLA COMPARISON:  Previous exam(s). FINDINGS: Targeted ultrasound is performed, showing normal  right axillary lymph nodes with normal fatty hila. No enlarged lymph nodes or abnormal cortical thickening is identified to suggest axillary nodal metastatic disease. IMPRESSION: Normal right axillary lymph nodes. RECOMMENDATION: Treatment planning for known right breast cancer. The patient is scheduled to be seen at the multidisciplinary breast cancer clinic next week. I have discussed the findings and recommendations with the patient. Results were also provided in writing at the conclusion of the visit. If applicable, a reminder letter will be sent to the patient regarding the next appointment. BI-RADS CATEGORY  1: Negative. Electronically Signed   By: Curlene Dolphin M.D.   On: 07/01/2017 12:55   Mm Clip Placement Right  Result Date: 06/29/2017 CLINICAL DATA:  Patient Is post stereotactic core needle biopsy of 2 groups of microcalcifications over the lower central right breast. EXAM: DIAGNOSTIC right MAMMOGRAM POST stereotactic BIOPSY COMPARISON:  Previous exam(s). FINDINGS: Mammographic images were obtained following stereotactic guided biopsy of 2 groups of microcalcifications over the lower central right breast. Images demonstrates satisfactory placement of a coil shaped metallic clip over the more lateral superior group of microcalcifications and satisfactory placement of a X shaped metallic clip over the more medial inferior group of microcalcifications. IMPRESSION: Satisfactory clip placement post stereotactic core needle biopsy 2 groups of right breast microcalcifications. Final Assessment: Post Procedure Mammograms for Marker Placement Electronically Signed   By: Marin Olp M.D.   On: 06/29/2017 12:45   Mm Rt Breast Bx W Loc Dev 1st Lesion Image Bx Spec Stereo Guide  Addendum Date: 06/30/2017   ADDENDUM REPORT: 06/30/2017 14:57 ADDENDUM: Pathology revealed GRADE I INVASIVE DUCTAL CARCINOMA, DUCTAL CARCINOMA IN SITU WITH CALCIFICATIONS of the Right breast, lower central, more lateral superior.  HIGH GRADE DUCTAL CARCINOMA IN SITU WITH CALCIFICATIONS of the Right breast, lower central, more medial inferior. This was found to be concordant by Dr. Marin Olp. Pathology results were discussed with the patient by telephone. The patient reported doing well after the biopsies with tenderness at the sites. Post biopsy instructions and care were reviewed and questions were answered. The patient was encouraged to call [facility] for any additional concerns. The patient was referred to [facility] on July 07, 2017. The patient is scheduled for a Right axillary ultrasound on July 01, 2017 for evaluation of her lymph nodes due to the invasive diagnosis. If breast conservation is a consideration, a biopsy of the anterior calcifications in the Right breast should be considered for further evaluation of extent of disease. Pathology results reported by Terie Purser, RN on 06/30/2017. Electronically Signed   By: Marin Olp M.D.   On: 06/30/2017 14:57   Result Date: 06/30/2017 CLINICAL DATA:  Patient presents for stereotactic core needle biopsy of 2 groups of microcalcifications over the lower central right breast. First biopsy site is at the more lateral and superior group of microcalcifications in the second biopsy site is at the more medial and inferior group of microcalcifications. EXAM: RIGHT BREAST STEREOTACTIC CORE NEEDLE BIOPSY COMPARISON:  Previous exams. FINDINGS: The patient and I discussed the procedure of stereotactic-guided biopsy including benefits and alternatives. We discussed the high likelihood  of a successful procedure. We discussed the risks of the procedure including infection, bleeding, tissue injury, clip migration, and inadequate sampling. Informed written consent was given. The usual time out protocol was performed immediately prior to the procedure. Using sterile technique and 1%  Lidocaine as local anesthetic, under stereotactic guidance, a 9 gauge vacuum assisted device was used to perform core needle biopsy of the targeted microcalcifications over the lower central right breast involving the more lateral superior group using a medial to lateral approach. Specimen radiograph was performed showing several of the targeted microcalcifications. Specimens with calcifications are identified for pathology. Lesion quadrant: Right lower inner quadrant at approximately 5:30 position. At the conclusion of the procedure, a coil shaped tissue marker clip was deployed into the biopsy cavity. Follow-up 2-view mammogram was performed and dictated separately. Using sterile technique and 1% Lidocaine as local anesthetic, under stereotactic guidance, a 9 gauge vacuum assisted device was used to perform core needle biopsy of the targeted microcalcifications over the lower central right breast involving the more medial inferior group using a medial to lateral approach. Specimen radiograph was performed showing multiple of the targeted microcalcifications. Specimens with calcifications are identified for pathology. Lesion quadrant: Right outer lower quadrant at approximately the 6:30 position. At the conclusion of the procedure, a X shaped tissue marker clip was deployed into the biopsy cavity. Follow-up 2-view mammogram was performed and dictated separately. IMPRESSION: Stereotactic-guided biopsy of 2 groups of right breast microcalcifications as described. No apparent complications. Electronically Signed: By: Marin Olp M.D. On: 06/29/2017 12:43   Mm Rt Breast Bx W Loc Dev Ea Ad Lesion Img Bx Spec Stereo Guide  Addendum Date: 06/30/2017   ADDENDUM REPORT: 06/30/2017 14:57 ADDENDUM: Pathology revealed GRADE I INVASIVE DUCTAL CARCINOMA, DUCTAL CARCINOMA IN SITU WITH CALCIFICATIONS of the Right breast, lower central, more lateral superior. HIGH GRADE DUCTAL CARCINOMA IN SITU WITH CALCIFICATIONS of the  Right breast, lower central, more medial inferior. This was found to be concordant by Dr. Marin Olp. Pathology results were discussed with the patient by telephone. The patient reported doing well after the biopsies with tenderness at the sites. Post biopsy instructions and care were reviewed and questions were answered. The patient was encouraged to call [facility] for any additional concerns. The patient was referred to [facility] on July 07, 2017. The patient is scheduled for a Right axillary ultrasound on July 01, 2017 for evaluation of her lymph nodes due to the invasive diagnosis. If breast conservation is a consideration, a biopsy of the anterior calcifications in the Right breast should be considered for further evaluation of extent of disease. Pathology results reported by Terie Purser, RN on 06/30/2017. Electronically Signed   By: Marin Olp M.D.   On: 06/30/2017 14:57   Result Date: 06/30/2017 CLINICAL DATA:  Patient presents for stereotactic core needle biopsy of 2 groups of microcalcifications over the lower central right breast. First biopsy site is at the more lateral and superior group of microcalcifications in the second biopsy site is at the more medial and inferior group of microcalcifications. EXAM: RIGHT BREAST STEREOTACTIC CORE NEEDLE BIOPSY COMPARISON:  Previous exams. FINDINGS: The patient and I discussed the procedure of stereotactic-guided biopsy including benefits and alternatives. We discussed the high likelihood of a successful procedure. We discussed the risks of the procedure including infection, bleeding, tissue injury, clip migration, and inadequate sampling. Informed written consent was given. The usual time  out protocol was performed immediately prior to the procedure. Using sterile technique and 1% Lidocaine as local anesthetic, under stereotactic guidance, a 9 gauge  vacuum assisted device was used to perform core needle biopsy of the targeted microcalcifications over the lower central right breast involving the more lateral superior group using a medial to lateral approach. Specimen radiograph was performed showing several of the targeted microcalcifications. Specimens with calcifications are identified for pathology. Lesion quadrant: Right lower inner quadrant at approximately 5:30 position. At the conclusion of the procedure, a coil shaped tissue marker clip was deployed into the biopsy cavity. Follow-up 2-view mammogram was performed and dictated separately. Using sterile technique and 1% Lidocaine as local anesthetic, under stereotactic guidance, a 9 gauge vacuum assisted device was used to perform core needle biopsy of the targeted microcalcifications over the lower central right breast involving the more medial inferior group using a medial to lateral approach. Specimen radiograph was performed showing multiple of the targeted microcalcifications. Specimens with calcifications are identified for pathology. Lesion quadrant: Right outer lower quadrant at approximately the 6:30 position. At the conclusion of the procedure, a X shaped tissue marker clip was deployed into the biopsy cavity. Follow-up 2-view mammogram was performed and dictated separately. IMPRESSION: Stereotactic-guided biopsy of 2 groups of right breast microcalcifications as described. No apparent complications. Electronically Signed: By: Marin Olp M.D. On: 06/29/2017 12:43    ELIGIBLE FOR AVAILABLE RESEARCH PROTOCOL: exact sciences study  ASSESSMENT: 77 y.o. [place], [place] woman status post right breast biopsy 07/01/2017 for a clinical mT2 N0, stage IB invasive ductal carcinoma, grade 1, estrogen and progesterone receptor positive, HER-2 nonamplified, with an MIB-105%  (1) a second area in the right breast biopsied 07/01/2017 showed ductal carcinoma in situ, grade 3, estrogen and progesterone  receptor positive.  (2) definitive surgery pending  (3) Oncotype DX to be obtained from the final surgical sample: Chemotherapy not anticipated  (4) adjuvant radiation as appropriate  (5) antiestrogens at the completion of local treatment  (6) genetics testing pending  PLAN: We spent the better part of today's hour-long appointment discussing the biology of her diagnosis and the specifics of her situation. We first reviewed the fact that cancer is not one disease but more than 100 different diseases and that it is important to keep them separate-- otherwise when friends and relatives discuss their own cancer experiences with Lauren Holt confusion can result. Similarly we explained that if breast cancer spreads to the bone or liver, the patient would not have bone cancer or liver cancer, but breast cancer in the bone and breast cancer in the liver: one cancer in three places-- not 3 different cancers which otherwise would have to be treated in 3 different ways.  We discussed the difference between local and systemic therapy. In terms of loco-regional treatment, lumpectomy plus radiation is equivalent to mastectomy as far as survival is concerned. For this reason, and because the cosmetic results are generally superior, we generally recommend breast conserving surgery.  However in Lauren Holt's case there is a real question about cosmetic results which she continues to discuss with Dr. Marlou Starks.  She has not yet made up her mind whether she wants mastectomy with or without reconstruction or lumpectomy and radiation  We then discussed the rationale for systemic therapy. There is some risk that this cancer may have already spread to other parts of her body. Patients frequently ask at this point about bone scans, CAT scans and PET scans to find out if they have occult breast  cancer somewhere else. The problem is that in early stage disease we are much more likely to find false positives then true cancers and this  would expose the patient to unnecessary procedures as well as unnecessary radiation. Scans cannot answer the question the patient really would like to know, which is whether she has microscopic disease elsewhere in her body. For those reasons we do not recommend them.  Of course we would proceed to aggressive evaluation of any symptoms that might suggest metastatic disease, but that is not the case here.  Next we went over the options for systemic therapy which are anti-estrogens, anti-HER-2 immunotherapy, and chemotherapy. Lauren Holt does not meet criteria for anti-HER-2 immunotherapy. She is a good candidate for anti-estrogens.  The question of chemotherapy is more complicated. Chemotherapy is most effective in rapidly growing, aggressive tumors. It is much less effective in low-grade, slow growing cancers, like Lauren Holt 's. For that reason we are going to request an Oncotype from the definitive surgical sample, as suggested by [facility] guidelines. That will help [place] make a definitive decision regarding chemotherapy in this case.  The general sequence then is surgery, whether lumpectomy plus radiation or mastectomy, then Oncotype testing, which I expect to be "low risk", then radiation if the patient chooses lumpectomy, and then antiestrogens.  I did give Lauren Holt a copy of the healthcare power of attorney form to complete on notarized and encouraged her to get that done before her return visit with me.  Lauren Holt has a good understanding of the overall plan. She agrees with it. She knows the goal of treatment in her case is cure. She will call with any problems that may develop before her next visit here.   Magrinat, Virgie Dad, MD  07/07/17 3:30 PM Medical Oncology [facility] H[facility] [address] [place], [place] [address] Tel. [phone]    Fax. [phone]  This document serves as a record of services personally performed by Lurline Del, MD. It was created on his behalf  by Sheron Nightingale, a trained medical scribe. The creation of this record is based on the scribe's personal observations and the provider's statements to them.   I have reviewed the above documentation for accuracy and completeness, and I agree with the above.

## 2017-07-07 ENCOUNTER — Encounter: Payer: Self-pay | Admitting: *Deleted

## 2017-07-07 ENCOUNTER — Encounter: Payer: Self-pay | Admitting: Oncology

## 2017-07-07 ENCOUNTER — Encounter: Payer: Self-pay | Admitting: Physical Therapy

## 2017-07-07 ENCOUNTER — Other Ambulatory Visit: Payer: Self-pay | Admitting: General Surgery

## 2017-07-07 ENCOUNTER — Inpatient Hospital Stay: Payer: Medicare HMO | Attending: Oncology | Admitting: Oncology

## 2017-07-07 ENCOUNTER — Encounter: Payer: Self-pay | Admitting: Radiation Oncology

## 2017-07-07 ENCOUNTER — Other Ambulatory Visit: Payer: Medicare HMO

## 2017-07-07 ENCOUNTER — Other Ambulatory Visit: Payer: Self-pay | Admitting: *Deleted

## 2017-07-07 ENCOUNTER — Other Ambulatory Visit: Payer: Self-pay

## 2017-07-07 ENCOUNTER — Ambulatory Visit
Admission: RE | Admit: 2017-07-07 | Discharge: 2017-07-07 | Disposition: A | Discharge status: Home / Self Care | Payer: Medicare HMO | Source: Ambulatory Visit | Attending: Radiation Oncology | Admitting: Radiation Oncology

## 2017-07-07 ENCOUNTER — Ambulatory Visit: Payer: Medicare HMO | Attending: General Surgery | Admitting: Physical Therapy

## 2017-07-07 VITALS — BP 123/69 | HR 85 | Temp 98.6°F | Resp 18 | Ht 63.5 in | Wt 177.6 lb

## 2017-07-07 DIAGNOSIS — Z803 Family history of malignant neoplasm of breast: Secondary | ICD-10-CM | POA: Insufficient documentation

## 2017-07-07 DIAGNOSIS — C50311 Malignant neoplasm of lower-inner quadrant of right female breast: Secondary | ICD-10-CM | POA: Diagnosis not present

## 2017-07-07 DIAGNOSIS — R921 Mammographic calcification found on diagnostic imaging of breast: Secondary | ICD-10-CM

## 2017-07-07 DIAGNOSIS — Z170 Estrogen receptor positive status [ER+]: Principal | ICD-10-CM

## 2017-07-07 DIAGNOSIS — F411 Generalized anxiety disorder: Secondary | ICD-10-CM | POA: Insufficient documentation

## 2017-07-07 DIAGNOSIS — R293 Abnormal posture: Secondary | ICD-10-CM | POA: Insufficient documentation

## 2017-07-07 NOTE — Patient Instructions (Signed)

## 2017-07-07 NOTE — Progress Notes (Signed)
Radiation Oncology         [phone]) [phone] ________________________________  Name: Lauren Holt        MRN: [id number]  Date of Service: 07/07/2017 DOB: [birth year]  MO:[id number], L.Marlou Sa, MD  Jovita Kussmaul, MD     REFERRING PHYSICIAN: Autumn Messing III, MD   DIAGNOSIS: The encounter diagnosis was Malignant neoplasm of lower-inner quadrant of right breast of female, estrogen receptor positive ([place]).   HISTORY OF PRESENT ILLNESS: Lauren Holt is a 77 y.o. female seen in the multidisciplinary breast clinic for a new diagnosis of right breast cancer. The patient was noted to have screening detected calcifications on mammogram. She was found on diagnostic imaging to have two separate areas posteriorly: lateral and superior, and medial inferior as well as an anterior component of calcifications. These areas in total measured 10 cm. And the lateral superior group were biopsied and revealed a grade 1, invasive ductal carcinoma, ER/PR positive, HER2 negative and Ki67 of 5%. The medial inferior group was biopsied and was consistent with high grade DCIS, ER/PR positive. She comes today to discuss options of treatment for her cancer.     PREVIOUS RADIATION THERAPY: No   PAST MEDICAL HISTORY: History reviewed. No pertinent past medical history.     PAST SURGICAL HISTORY:  Hysterectomy   FAMILY HISTORY:  Family History  Problem Relation Age of Onset  . Breast cancer Paternal Aunt   . Breast cancer Cousin      SOCIAL HISTORY:   The patient is divorced and lives in [place]. She is retired and was a Materials engineer. She is accompanied by her eldest daughter.    ALLERGIES: Patient has no known allergies.   MEDICATIONS:  Current Outpatient Medications  Medication Sig Dispense Refill  . Ascorbic Acid (VITAMIN C) 500 MG/5ML LIQD 1 tablet    . aspirin 325 MG tablet 1 tablet    . calcium gluconate in sodium chloride 0.9 % 25 mL 1 tablet after meals    . Omega-3 Fatty Acids (FISH OIL) 500  MG CAPS 1 capsule    . Pediatric Multivitamins-Fl (MULTIVITAMINS/FL PO) 1 tablet    . Red Yeast Rice 600 MG CAPS 1 or 2 capsules     No current facility-administered medications for this encounter.      REVIEW OF SYSTEMS: On review of systems, the patient reports that she is doing well overall. She denies any chest pain, shortness of breath, cough, fevers, chills, night sweats, unintended weight changes. She denies any bowel or bladder disturbances, and denies abdominal pain, nausea or vomiting. She denies any new musculoskeletal or joint aches or pains. A complete review of systems is obtained and is otherwise negative.     PHYSICAL EXAM:  Wt Readings from Last 3 Encounters:  07/07/17 177 lb 9.6 oz (80.6 kg)   Temp Readings from Last 3 Encounters:  07/07/17 98.6 F (37 C) (Oral)   BP Readings from Last 3 Encounters:  07/07/17 123/69   Pulse Readings from Last 3 Encounters:  07/07/17 85     In general this is a well appearing caucasian  female in no acute distress. She is alert and oriented x4 and appropriate throughout the examination. HEENT reveals that the patient is normocephalic, atraumatic. EOMs are intact. PERRLA. Skin is intact without any evidence of gross lesions. Cardiovascular exam reveals a regular rate and rhythm, no clicks rubs or murmurs are auscultated. Chest is clear to auscultation bilaterally. Lymphatic assessment is performed and does not reveal any adenopathy in  the cervical, supraclavicular, axillary, or inguinal chains. Bilateral breast exam is performed and reveals bruising of the right breast no palpable mass is noted, and no masses are noted of the left breast. No nipple bleeding or discharge is noted. Abdomen has active bowel sounds in all quadrants and is intact. The abdomen is soft, non tender, non distended. Lower extremities are negative for pretibial pitting edema, deep calf tenderness, cyanosis or clubbing.   ECOG = 0  0 - Asymptomatic (Fully active,  able to carry on all predisease activities without restriction)  1 - Symptomatic but completely ambulatory (Restricted in physically strenuous activity but ambulatory and able to carry out work of a light or sedentary nature. For example, light housework, office work)  2 - Symptomatic, <50% in bed during the day (Ambulatory and capable of all self care but unable to carry out any work activities. Up and about more than 50% of waking hours)  3 - Symptomatic, >50% in bed, but not bedbound (Capable of only limited self-care, confined to bed or chair 50% or more of waking hours)  4 - Bedbound (Completely disabled. Cannot carry on any self-care. Totally confined to bed or chair)  5 - Death   Eustace Pen MM, Creech RH, Tormey DC, et al. [phone]). "Toxicity and response criteria of the [facility]". [place] Oncol. 5 (6): 649-55    LABORATORY DATA:  No results found for: WBC, HGB, HCT, MCV, PLT No results found for: NA, K, CL, CO2 No results found for: ALT, AST, GGT, ALKPHOS, BILITOT    RADIOGRAPHY: Mm Digital Diagnostic Unilat R  Result Date: 06/23/2017 CLINICAL DATA:  Patient returns today to evaluate right breast calcifications identified on recent screening mammogram. EXAM: DIGITAL DIAGNOSTIC RIGHT MAMMOGRAM WITH CAD COMPARISON:  Previous exams including recent screening mammogram dated 06/14/2017. ACR Breast Density Category c: The breast tissue is heterogeneously dense, which may obscure small masses. FINDINGS: On today's additional magnification views, 2 nearby groups of suspicious calcifications are confirmed within the lower right breast, with an overall segmental distribution. The more inferior group of pleomorphic and amorphous calcifications have an overall segmental distribution spanning 9.7 cm extent from posterior to anterior depth, with a suspicious linear/ductal component posteriorly. The most suspicious components are seen at its most posterior and anterior  aspects. The slightly more superior group of pleomorphic and amorphous calcifications span approximately 4 cm extent. The most suspicious component is seen at its posterior aspect, with possible small associated mass. Mammographic images were processed with CAD. IMPRESSION: Suspicious calcifications within the inferior right breast, anterior to posterior extent, overall segmental distribution, more inferior component spanning nearly 10 cm, more superior component spanning 4 cm extent. The more superior component may have a small associated mass. Stereotactic biopsy recommended for 2 sites. RECOMMENDATION: 1. Stereotactic biopsy of the slightly more superior-lateral group of pleomorphic and amorphous calcifications within the lower right breast, spanning 4 cm extent. Recommend biopsy sampling of the the most suspicious component which is at its posterior aspect, with possible associated mass. 2. Stereotactic biopsy of the more inferior group of pleomorphic and amorphous calcifications, spanning 9.7 cm extent from posterior to anterior depth. Recommend biopsy at either the most posterior or anterior extent of these calcifications. The most anterior calcifications, however, may not be accessible with stereotactic biopsy. Stereotactic biopsies are scheduled for March 19th. I have discussed the findings and recommendations with the patient. Results were also provided in writing at the conclusion of the visit. If applicable, a reminder  letter will be sent to the patient regarding the next appointment. BI-RADS CATEGORY  4: Suspicious. Electronically Signed   By: Franki Cabot M.D.   On: 06/23/2017 13:10   Mm Screening Breast Tomo Bilateral  Result Date: 06/15/2017 CLINICAL DATA:  Screening. EXAM: DIGITAL SCREENING BILATERAL MAMMOGRAM WITH TOMO AND CAD COMPARISON:  Previous exam(s). ACR Breast Density Category c: The breast tissue is heterogeneously dense, which may obscure small masses. FINDINGS: In the right breast,  calcifications warrant further evaluation with magnified views. In the left breast, no findings suspicious for malignancy. Images were processed with CAD. IMPRESSION: Further evaluation is suggested for calcifications in the right breast. RECOMMENDATION: Diagnostic mammogram of the right breast. (Code:[id number]) The patient will be contacted regarding the findings, and additional imaging will be scheduled. BI-RADS CATEGORY  0: Incomplete. Need additional imaging evaluation and/or prior mammograms for comparison. Electronically Signed   By: Evangeline Dakin M.D.   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 She presents for ultrasound evaluation of the right axilla. EXAM: ULTRASOUND OF THE RIGHT AXILLA COMPARISON:  Previous exam(s). FINDINGS: Targeted ultrasound is performed, showing normal right axillary lymph nodes with normal fatty hila. No enlarged lymph nodes or abnormal cortical thickening is identified to suggest axillary nodal metastatic disease. IMPRESSION: Normal right axillary lymph nodes. RECOMMENDATION: Treatment planning for known right breast cancer. The patient is scheduled to be seen at the multidisciplinary breast cancer clinic next week. I have discussed the findings and recommendations with the patient. Results were also provided in writing at the conclusion of the visit. If applicable, a reminder letter will be sent to the patient regarding the next appointment. BI-RADS CATEGORY  1: Negative. Electronically Signed   By: Curlene Dolphin M.D.   On: 07/01/2017 12:55   Mm Clip Placement Right  Result Date: 06/29/2017 CLINICAL DATA:  Patient Is post stereotactic core needle biopsy of 2 groups of microcalcifications over the lower central right breast. EXAM: DIAGNOSTIC right MAMMOGRAM POST stereotactic  BIOPSY COMPARISON:  Previous exam(s). FINDINGS: Mammographic images were obtained following stereotactic guided biopsy of 2 groups of microcalcifications over the lower central right breast. Images demonstrates satisfactory placement of a coil shaped metallic clip over the more lateral superior group of microcalcifications and satisfactory placement of a X shaped metallic clip over the more medial inferior group of microcalcifications. IMPRESSION: Satisfactory clip placement post stereotactic core needle biopsy 2 groups of right breast microcalcifications. Final Assessment: Post Procedure Mammograms for Marker Placement Electronically Signed   By: Marin Olp M.D.   On: 06/29/2017 12:45   Mm Rt Breast Bx W Loc Dev 1st Lesion Image Bx Spec Stereo Guide  Addendum Date: 06/30/2017   ADDENDUM REPORT: 06/30/2017 14:57 ADDENDUM: Pathology revealed GRADE I INVASIVE DUCTAL CARCINOMA, DUCTAL CARCINOMA IN SITU WITH CALCIFICATIONS of the Right breast, lower central, more lateral superior. HIGH GRADE DUCTAL CARCINOMA IN SITU WITH CALCIFICATIONS of the Right breast, lower central, more medial inferior. This was found to be concordant by Dr. Marin Olp. Pathology results were discussed with the patient by telephone. The patient reported doing well after the biopsies with tenderness at the sites. Post biopsy instructions and care were reviewed and questions were answered. The patient was encouraged to call [facility] for any additional concerns. The patient was [facility] to [facility] on July 07, 2017. The patient is scheduled for a Right axillary ultrasound on July 01, 2017 for evaluation of her lymph nodes due to the invasive diagnosis. If breast conservation is a consideration, a biopsy of the anterior calcifications in the Right breast should be considered for further evaluation of extent of disease. Pathology  results reported by Terie Purser, RN on 06/30/2017. Electronically Signed   By: Marin Olp M.D.   On: 06/30/2017 14:57   Result Date: 06/30/2017 CLINICAL DATA:  Patient presents for stereotactic core needle biopsy of 2 groups of microcalcifications over the lower central right breast. First biopsy site is at the more lateral and superior group of microcalcifications in the second biopsy site is at the more medial and inferior group of microcalcifications. EXAM: RIGHT BREAST STEREOTACTIC CORE NEEDLE BIOPSY COMPARISON:  Previous exams. FINDINGS: The patient and I discussed the procedure of stereotactic-guided biopsy including benefits and alternatives. We discussed the high likelihood of a successful procedure. We discussed the risks of the procedure including infection, bleeding, tissue injury, clip migration, and inadequate sampling. Informed written consent was given. The usual time out protocol was performed immediately prior to the procedure. Using sterile technique and 1% Lidocaine as local anesthetic, under stereotactic guidance, a 9 gauge vacuum assisted device was used to perform core needle biopsy of the targeted microcalcifications over the lower central right breast involving the more lateral superior group using a medial to lateral approach. Specimen radiograph was performed showing several of the targeted microcalcifications. Specimens with calcifications are identified for pathology. Lesion quadrant: Right lower inner quadrant at approximately 5:30 position. At the conclusion of the procedure, a coil shaped tissue marker clip was deployed into the biopsy cavity. Follow-up 2-view mammogram was performed and dictated separately. Using sterile technique and 1% Lidocaine as local anesthetic, under stereotactic guidance, a 9 gauge vacuum assisted device was used to perform core needle biopsy of the targeted microcalcifications over the lower central right breast involving the more medial inferior group  using a medial to lateral approach. Specimen radiograph was performed showing multiple of the targeted microcalcifications. Specimens with calcifications are identified for pathology. Lesion quadrant: Right outer lower quadrant at approximately the 6:30 position. At the conclusion of the procedure, a X shaped tissue marker clip was deployed into the biopsy cavity. Follow-up 2-view mammogram was performed and dictated separately. IMPRESSION: Stereotactic-guided biopsy of 2 groups of right breast microcalcifications as described. No apparent complications. Electronically Signed: By: Marin Olp M.D. On: 06/29/2017 12:43   Mm Rt Breast Bx W Loc Dev Ea Ad Lesion Img Bx Spec Stereo Guide  Addendum Date: 06/30/2017   ADDENDUM REPORT: 06/30/2017 14:57 ADDENDUM: Pathology revealed GRADE I INVASIVE DUCTAL CARCINOMA, DUCTAL CARCINOMA IN SITU WITH CALCIFICATIONS of the Right breast, lower central, more lateral superior. HIGH GRADE DUCTAL CARCINOMA IN SITU WITH CALCIFICATIONS of the Right breast, lower central, more medial inferior. This was found to be concordant by Dr. Marin Olp. Pathology results were discussed with the patient by telephone. The patient reported doing well after the biopsies with tenderness at the sites. Post biopsy instructions and care were reviewed and questions were answered. The patient was encouraged to call [facility] for any additional concerns. The patient was referred to [facility] on July 07, 2017. The patient is scheduled for a Right axillary ultrasound on July 01, 2017 for evaluation of  her lymph nodes due to the invasive diagnosis. If breast conservation is a consideration, a biopsy of the anterior calcifications in the Right breast should be considered for further evaluation of extent of disease. Pathology results reported by Terie Purser, RN on 06/30/2017. Electronically Signed    By: Marin Olp M.D.   On: 06/30/2017 14:57   Result Date: 06/30/2017 CLINICAL DATA:  Patient presents for stereotactic core needle biopsy of 2 groups of microcalcifications over the lower central right breast. First biopsy site is at the more lateral and superior group of microcalcifications in the second biopsy site is at the more medial and inferior group of microcalcifications. EXAM: RIGHT BREAST STEREOTACTIC CORE NEEDLE BIOPSY COMPARISON:  Previous exams. FINDINGS: The patient and I discussed the procedure of stereotactic-guided biopsy including benefits and alternatives. We discussed the high likelihood of a successful procedure. We discussed the risks of the procedure including infection, bleeding, tissue injury, clip migration, and inadequate sampling. Informed written consent was given. The usual time out protocol was performed immediately prior to the procedure. Using sterile technique and 1% Lidocaine as local anesthetic, under stereotactic guidance, a 9 gauge vacuum assisted device was used to perform core needle biopsy of the targeted microcalcifications over the lower central right breast involving the more lateral superior group using a medial to lateral approach. Specimen radiograph was performed showing several of the targeted microcalcifications. Specimens with calcifications are identified for pathology. Lesion quadrant: Right lower inner quadrant at approximately 5:30 position. At the conclusion of the procedure, a coil shaped tissue marker clip was deployed into the biopsy cavity. Follow-up 2-view mammogram was performed and dictated separately. Using sterile technique and 1% Lidocaine as local anesthetic, under stereotactic guidance, a 9 gauge vacuum assisted device was used to perform core needle biopsy of the targeted microcalcifications over the lower central right breast involving the more medial inferior group using a medial to lateral approach. Specimen radiograph was performed  showing multiple of the targeted microcalcifications. Specimens with calcifications are identified for pathology. Lesion quadrant: Right outer lower quadrant at approximately the 6:30 position. At the conclusion of the procedure, a X shaped tissue marker clip was deployed into the biopsy cavity. Follow-up 2-view mammogram was performed and dictated separately. IMPRESSION: Stereotactic-guided biopsy of 2 groups of right breast microcalcifications as described. No apparent complications. Electronically Signed: By: Marin Olp M.D. On: 06/29/2017 12:43       IMPRESSION/PLAN: 1. At least Stage IA, cT1N0M0, grade 1 ER/PR positive invasive ductal carcinoma of the right breast with high grade DCIS. Dr. Lisbeth Renshaw discusses the pathology findings and reviews the nature of invasive and non invasive breast disease. The consensus from the breast conference includes the findings of the calcifications anteriorly have not been biopsied. If she had goals of breast conservation surgery, this area would also need to be biopsied. If she was more motivated toward mastectomy, she would not need additional biopsy. Considering the span of the areas,  Dr. Marlou Starks favors mastectomy, and after discussing her thoughts, she is not sure how she'd approach surgery but is concerned bout cosmetic outcome.  We did review radiotherapy in the setting of breast conservation if she did proceed with this approach, and we discussed the risks, benefits, short, and long term effects of radiotherapy. Dr. Lisbeth Renshaw discusses the delivery and logistics of radiotherapy and anticipates a course of 4 or 6 1/2  weeks. We will follow along with the surgical approach she elects for and intervene with radiotherapy as indicated by her surgery  and final pathology. 2. Possible genetic predisposition to malignancy. The patient is a candidate for and will proceed with genetic testing.  The above documentation reflects my direct findings during this shared patient visit.  Please see the separate note by Dr. Lisbeth Renshaw on this date for the remainder of the patient's plan of care.    Carola Rhine, PAC

## 2017-07-07 NOTE — Therapy (Signed)
[place], [place], [address] Phone: [phone]   Fax:  [phone]  Physical Therapy Evaluation  Patient Details  Name: Lauren Holt MRN: [id number] Date of Birth: 08-29-1940 Referring Provider: Dr. Autumn Messing   Encounter Date: 07/07/2017  PT End of Session - 07/07/17 1948    Visit Number  1    Number of Visits  2    Date for PT Re-Evaluation  09/01/17    PT Start Time  1537    PT Stop Time  1606    PT Time Calculation (min)  29 min    Activity Tolerance  Patient tolerated treatment well    Behavior During Therapy  [facility] for tasks assessed/performed       Past Medical History:  Diagnosis Date  . Anxiety   . Dermatitis     Past Surgical History:  Procedure Laterality Date  . ABDOMINAL HYSTERECTOMY      There were no vitals filed for this visit.   Subjective Assessment - 07/07/17 1942    Subjective  Patient reports she is here today to be seen by her medical team for her newly diagnosed right breast cancer.    Patient is accompained by:  Family member    Pertinent History  Patient was diagnosed on 06/14/17 with right ER/PR positive, HER2 negative invasive ductal carcinoma breast cancer. There is a 10 cm area of calcs and the Ki67 is 5%.    Patient Stated Goals  reduce lymphedema risk and learn post op shoulder ROM HEP    Currently in Pain?  Yes    Pain Score  2     Pain Location  Back    Pain Orientation  Left;Lower    Pain Descriptors / Indicators  Aching    Pain Type  Chronic pain    Pain Onset  More than a month ago    Pain Frequency  Intermittent    Aggravating Factors   Unknown    Pain Relieving Factors  Unknown    Multiple Pain Sites  No         [facility] PT Assessment - 07/07/17 0001      Assessment   Medical Diagnosis  Right breast cancer    Referring Provider  Dr. Autumn Messing    Onset Date/Surgical Date  06/14/17    Hand Dominance  Right    Prior Therapy  none      Precautions   Precautions  Other (comment)    Precaution Comments  active cancer      Restrictions   Weight Bearing Restrictions  No      Balance Screen   Has the patient fallen in the past 6 months  No    Has the patient had a decrease in activity level because of a fear of falling?   No    Is the patient reluctant to leave their home because of a fear of falling?   No      Home Environment   Living Environment  Private residence    [place] Adult son    Available Help at Discharge  Family      Prior Function   Level of [place]  Retired    Leisure  She walks 3-4x/week for 40 min      Cognition   Overall Cognitive Status  Within Functional Limits for tasks assessed      Posture/Postural Control   Posture/Postural  Control  Postural limitations    Postural Limitations  Rounded Shoulders;Forward head      ROM / Strength   AROM / PROM / Strength  AROM;Strength      AROM   AROM Assessment Site  Shoulder;Cervical    Right/Left Shoulder  Right;Left    Right Shoulder Extension  41 Degrees    Right Shoulder Flexion  135 Degrees    Right Shoulder ABduction  148 Degrees    Right Shoulder Internal Rotation  82 Degrees    Right Shoulder External Rotation  74 Degrees    Left Shoulder Extension  46 Degrees    Left Shoulder Flexion  135 Degrees    Left Shoulder ABduction  147 Degrees    Left Shoulder Internal Rotation  85 Degrees    Left Shoulder External Rotation  81 Degrees    Cervical Flexion  WNL    Cervical Extension  WNL    Cervical - Right Side Bend  WNL    Cervical - Left Side Bend  WNL    Cervical - Right Rotation  WNL    Cervical - Left Rotation  WNL      Strength   Overall Strength  Within functional limits for tasks performed        LYMPHEDEMA/ONCOLOGY QUESTIONNAIRE - 07/07/17 1947      Type   Cancer Type  right breast      Lymphedema Assessments   Lymphedema Assessments  Upper extremities      Right  Upper Extremity Lymphedema   10 cm Proximal to Olecranon Process  27.2 cm    Olecranon Process  24.2 cm    10 cm Proximal to Ulnar Styloid Process  21.1 cm    Just Proximal to Ulnar Styloid Process  15 cm    Across Hand at [employer]  19.2 cm    At [place] of 2nd Digit  6.3 cm      Left Upper Extremity Lymphedema   10 cm Proximal to Olecranon Process  27.7 cm    Olecranon Process  23.2 cm    10 cm Proximal to Ulnar Styloid Process  19.9 cm    Just Proximal to Ulnar Styloid Process  14.6 cm    Across Hand at [employer]  18 cm    At [place] of 2nd Digit  5.9 cm           No data recorded  Objective measurements completed on examination: See above findings.    Patient was instructed today in a home exercise program today for post op shoulder range of motion. These included active assist shoulder flexion in sitting, scapular retraction, wall walking with shoulder abduction, and hands behind head external rotation.  She was encouraged to do these twice a day, holding 3 seconds and repeating 5 times when permitted by her physician.     PT Education - 07/07/17 1948    Education provided  Yes    Education Details  Lymphedema risk reduction and post op shoulder ROM HEP    Person(s) Educated  Patient    Methods  Explanation;Demonstration;Handout    Comprehension  Returned demonstration;Verbalized understanding          PT Long Term Goals - 07/07/17 1959      PT LONG TERM GOAL #1   Title  Patient will demonstrate she has returned to baseline related to shoulder ROM and function.    Time  8    Period  Weeks  Status  New      Breast Clinic Goals - 07/07/17 1958      Patient will be able to verbalize understanding of pertinent lymphedema risk reduction practices relevant to her diagnosis specifically related to skin care.   Time  1    Period  Days    Status  Achieved      Patient will be able to return demonstrate and/or verbalize understanding of the post-op home  exercise program related to regaining shoulder range of motion.   Time  1    Period  Days    Status  Achieved      Patient will be able to verbalize understanding of the importance of attending the postoperative After Breast Cancer Class for further lymphedema risk reduction education and therapeutic exercise.   Time  1    Period  Days    Status  Achieved            Plan - 07/07/17 1949    Clinical Impression Statement  Patient was diagnosed on 06/14/17 with right ER/PR positive, HER2 negative invasive ductal carcinoma breast cancer. There is a 10 cm area of calcs and the Ki67 is 5%. Her multidisciplinary medical team met prior to her assessments to determine a recommended treatment plan. She is planning to have additonal biopsies to determine extent of disease which will determine if she will have a right lumpectomy or mastectomy. Either way, she will have a sentinel node biopsy followed by radiation and anti-estrogen therapy. She will benefit from a post op PT reassessment to determine needs.    History and Personal Factors relevant to plan of care:  None    Clinical Presentation  Stable    Clinical Decision Making  Low    Rehab Potential  Excellent    Clinical Impairments Affecting Rehab Potential  None    PT Frequency  -- Eval and 1 f/u visit    PT Treatment/Interventions  ADLs/Self Care Home Management;Therapeutic exercise;Patient/family education    PT Next Visit Plan  Will f/u 3-4 weeks post op to reassess    PT Home Exercise Plan  Post op shoulder ROM HEP    Consulted and Agree with Plan of Care  Patient;Family member/caregiver    Family Member Consulted  Daughter       Patient will benefit from skilled therapeutic intervention in order to improve the following deficits and impairments:  Decreased knowledge of precautions, Impaired UE functional use, Decreased range of motion, Postural dysfunction, Pain  Visit Diagnosis: Malignant neoplasm of lower-inner quadrant of right  breast of female, estrogen receptor positive ([place]) - Plan: PT plan of care cert/re-cert  Abnormal posture - Plan: PT plan of care cert/re-cert   Patient will follow up at outpatient cancer rehab 3-4 weeks following surgery.  If the patient requires physical therapy at that time, a specific plan will be dictated and sent to the referring physician for approval. The patient was educated today on appropriate basic range of motion exercises to begin post operatively and the importance of attending the After Breast Cancer class following surgery.  Patient was educated today on lymphedema risk reduction practices as it pertains to recommendations that will benefit the patient immediately following surgery.  She verbalized good understanding.      Problem List Patient Active Problem List   Diagnosis Date Noted  . Malignant neoplasm of lower-inner quadrant of right breast of female, estrogen receptor positive ([place]) 07/02/2017  . DEPRESSION 12/31/2006  . CARPAL TUNNEL SYNDROME 12/31/2006  .  ARTHRALGIA, TMJ 12/31/2006  . ROSACEA 12/31/2006  . ARTHRALGIA 12/31/2006  . FIBROMYALGIA 12/31/2006  . SLEEP APNEA 12/31/2006   Annia Friendly, PT 07/07/17 8:03 PM  [place], [place], [address] Phone: [phone]   Fax:  [phone]  Name: Lauren Holt MRN: [id number] Date of Birth: [birth year]

## 2017-07-08 ENCOUNTER — Telehealth: Payer: Self-pay | Admitting: Oncology

## 2017-07-08 NOTE — Telephone Encounter (Signed)
Per 3/27 no los °

## 2017-07-08 NOTE — Telephone Encounter (Signed)
Sent letter regarding 5/15

## 2017-07-09 ENCOUNTER — Ambulatory Visit
Admission: RE | Admit: 2017-07-09 | Discharge: 2017-07-09 | Disposition: A | Discharge status: Home / Self Care | Payer: Medicare HMO | Source: Ambulatory Visit | Attending: General Surgery | Admitting: General Surgery

## 2017-07-09 DIAGNOSIS — D0511 Intraductal carcinoma in situ of right breast: Secondary | ICD-10-CM | POA: Diagnosis not present

## 2017-07-09 DIAGNOSIS — R921 Mammographic calcification found on diagnostic imaging of breast: Secondary | ICD-10-CM

## 2017-07-12 ENCOUNTER — Encounter: Payer: Self-pay | Admitting: General Practice

## 2017-07-12 ENCOUNTER — Inpatient Hospital Stay: Payer: Medicare HMO

## 2017-07-12 ENCOUNTER — Encounter: Payer: Self-pay | Admitting: Genetics

## 2017-07-12 ENCOUNTER — Inpatient Hospital Stay: Payer: Medicare HMO | Attending: Oncology | Admitting: Genetics

## 2017-07-12 ENCOUNTER — Other Ambulatory Visit: Payer: Self-pay | Admitting: Oncology

## 2017-07-12 ENCOUNTER — Encounter: Payer: Self-pay | Admitting: *Deleted

## 2017-07-12 DIAGNOSIS — Z803 Family history of malignant neoplasm of breast: Secondary | ICD-10-CM

## 2017-07-12 DIAGNOSIS — Z170 Estrogen receptor positive status [ER+]: Principal | ICD-10-CM

## 2017-07-12 DIAGNOSIS — C50311 Malignant neoplasm of lower-inner quadrant of right female breast: Secondary | ICD-10-CM

## 2017-07-12 DIAGNOSIS — Z8042 Family history of malignant neoplasm of prostate: Secondary | ICD-10-CM

## 2017-07-12 HISTORY — DX: Family history of malignant neoplasm of breast: Z80.3

## 2017-07-12 HISTORY — DX: Family history of malignant neoplasm of prostate: Z80.42

## 2017-07-12 NOTE — Progress Notes (Signed)
REFERRING PROVIDER: Chauncey Cruel, MD [address] [place], [place] [address]  PRIMARY PROVIDER:  Alroy Dust, Carlean Jews.Marlou Sa, MD  PRIMARY REASON FOR VISIT:  1. Malignant neoplasm of lower-inner quadrant of right breast of female, estrogen receptor positive ([place])   2. Family history of breast cancer     HISTORY OF PRESENT ILLNESS:   Lauren Holt, a 77 y.o. female, was seen for a [place] cancer genetics consultation at the request of Dr. Jana Hakim due to a personal and family history of breast cancer.  Lauren Holt presents to clinic today to discuss the possibility of a hereditary predisposition to cancer, genetic testing, and to further clarify her future cancer risks, as well as potential cancer risks for family members.   On 3-19/2019 76, at the age of Invasive ductal carcinoma ER/PR+, HER, Lauren Holt was diagnosed with  Negative and DCIS ER/PR+ of the right breast. She is currently considering breast surgical options.  This Holt be followed by adjuvant radiation if appropriate and antiestrogen therapy.    HORMONAL RISK FACTORS:  Menarche was at age 72.  First live birth at age 38.  OCP use for approximately yes.  Ovaries intact: yes.  Hysterectomy: yes.  Menopausal status: postmenopausal.  HRT use: 2 years. Colonoscopy: yes; 2018- normal.  previous colonoscopy  revealed 3 precancerous polyps.  She has gone every 5 years. Mammogram within the last year: was told she did not need any more in the future.     Past Medical History:  Diagnosis Date  . Anxiety   . Dermatitis   . Family history of breast cancer 07/12/2017    Past Surgical History:  Procedure Laterality Date  . ABDOMINAL HYSTERECTOMY      Social History   Socioeconomic History  . Marital status: Divorced    Spouse name: Not on file  . Number of children: Not on file  . Years of education: Not on file  . Highest education level: Not on file  Occupational History  . Not on file  Social Needs  .  Financial resource strain: Not on file  . Food insecurity:    Worry: Not on file    Inability: Not on file  . Transportation needs:    Medical: Not on file    Non-medical: Not on file  Tobacco Use  . Smoking status: Never Smoker  . Smokeless tobacco: Never Used  Substance and Sexual Activity  . Alcohol use: Yes  . Drug use: Never  . Sexual activity: Not on file  Lifestyle  . Physical activity:    Days per week: Not on file    Minutes per session: Not on file  . Stress: Not on file  Relationships  . Social connections:    Talks on phone: Not on file    Gets together: Not on file    Attends religious service: Not on file    Active member of club or organization: Not on file    Attends meetings of clubs or organizations: Not on file    Relationship status: Not on file  Other Topics Concern  . Not on file  Social History Narrative  . Not on file     FAMILY HISTORY:  We obtained a detailed, 4-generation family history.  Significant diagnoses are listed below: Family History  Problem Relation Age of Onset  . Breast cancer Paternal Aunt   . Breast cancer Cousin    Lauren Holt has a daughter and 2 sons int heir 59's with no history  of cancer.  Lauren Holt has 4 grandchildren with no histoyr of cancer.  Lauren Holt has 3 sisters and 5 brothers: -1 brother is a maternal half-brother in his 69's with no history of cancer.  -4 full brothers in their 25's with no history of cance.r  -1 sister is 9 with no history of cancer.  -2 sisters in their 60's with no history of cancer.  No nieces or nephews with any history of cancer.   Lauren Holt father died at 11 due to Emphysema.  He had prostate cancer in his 35's that was never treated (slow growing).  He also had some precancerous polyps identified in his lifetime. Lauren Holt has 4 paternal uncles with no history of cancer.   -1 uncle had 2 daughters who developed breast cacner (one in her 89's and the other in her  72's).   -1 uncle had a daughter who died of breast cancer in her 76's.  -1 uncle had a daughter who developed breast cancer I her late 6's.  Lauren Holt paternal grandfather died in his 68's with no history of cancer.  Lauren Holt paternal grandmother died in her 16s with no history of cancer.  She had a sister (patient's great aunt) who had breast cancer.   Lauren Holt mother is 40 with no history of cancer. Lauren Holt had 2 maternal aunts who are deceased with no history of cancer.  Lauren Holt has 1 maternal uncle in his 23's with no history of cancer,and 3 maternal uncles who are deceased with no history of cancer. No maternal cousins with any history of cancer.  Lauren Holt maternal grandfather died when her mother was very young- no cancer.  Lauren Holt maternal grandmother died at 68 with no history of cancer.   Lauren Holt is unaware of previous family history of genetic testing for hereditary cancer risks. Patient's maternal ancestors are of Northern European/Mexican/Spanish/Native american descent, and paternal ancestors are of English/Swedish descent. There is no reported Ashkenazi Jewish ancestry. There is no known consanguinity.  GENETIC COUNSELING ASSESSMENT: Lauren Holt is a 77 y.o. female with a personal and family history which is somewhat suggestive of a Hereditary Cancer Predisposition Syndrome. We, therefore, discussed and recommended the following at today's visit.   DISCUSSION: We reviewed the characteristics, features and inheritance patterns of hereditary cancer syndromes. We also discussed genetic testing, including the appropriate family members to test, the process of testing, insurance coverage and turn-around-time for results. We discussed the implications of a negative, positive and/or variant of uncertain significant result. In order to get genetic test results in a timely manner so that Ms. Goble can use these genetic test results for  surgical decisions, we recommended Ms. Hogans pursue genetic testing for the Breast Cancer STAT Panel. The STAT Breast cancer panel offered by [facility] includes sequencing and rearrangement analysis for the following 9 genes:  ATM, BRCA1, BRCA2, CDH1, CHEK2, PALB2, PTEN, STK11 and TP53.  We then recommend Ms. Gundlach pursue reflex genetic testing to the Common Hereditary Cancers gene panel. The Common Hereditary Cancer Panel offered by [facility] includes sequencing and/or deletion duplication testing of the following 47 genes: APC, ATM, AXIN2, BARD1, BMPR1A, BRCA1, BRCA2, BRIP1, CDH1, CDKN2A (p14ARF), CDKN2A (p16INK4a), CKD4, CHEK2, CTNNA1, DICER1, EPCAM (Deletion/duplication testing only), GREM1 (promoter region deletion/duplication testing only), KIT, MEN1, MLH1, MSH2, MSH3, MSH6, MUTYH, NBN, NF1, NHTL1, PALB2, PDGFRA, PMS2, POLD1, POLE, PTEN, RAD50, RAD51C, RAD51D, SDHB, SDHC, SDHD, SMAD4, SMARCA4. STK11, TP53, TSC1, TSC2, and VHL.  The following  genes were evaluated for sequence changes only: SDHA and HOXB13 c.251G>A variant only.  We discussed that only 5-10% of cancers are associated with a Hereditary cancer predisposition syndrome.  One of the most common hereditary cancer syndromes that increases breast cancer risk is called Hereditary Breast and Ovarian Cancer (HBOC) syndrome.  This syndrome is caused by mutations in the BRCA1 and BRCA2 genes.  This syndrome increases an individual's lifetime risk to develop breast, ovarian, pancreatic, and other types of cancer.  There are also many other cancer predisposition syndromes caused by mutations in several other genes.  We discussed that if she is found to have a mutation in one of these genes, it may impact surgical decisions, and alter future medical management recommendations such as increased cancer screenings and consideration of risk reducing surgeries.  A positive result could also have implications for the patient's family members.  A Negative  result would mean we were unable to identify a hereditary component to her cancer, but does not rule out the possibility of a hereditary basis for her cancer.  There could be mutations that are undetectable by current technology, or in genes not yet tested or identified to increase cancer risk.    We discussed the potential to find a Variant of Uncertain Significance or VUS.  These are variants that have not yet been identified as pathogenic or benign, and it is unknown if this variant is associated with increased cancer risk or if this is a normal finding.  Most VUS's are reclassified to benign or likely benign.   It should not be used to make medical management decisions. With time, we suspect the lab Holt determine the significance of any VUS's identified if any.   Based on Ms. Liggett's personal and family history of cancer, she meets medical criteria for genetic testing. Despite that she meets criteria, she may still have an out of pocket cost. We discussed that if her out of pocket cost for testing is over $100, the laboratory Holt call and confirm whether she wants to proceed with testing.  If the out of pocket cost of testing is less than $100 she Holt be billed by the genetic testing laboratory.   PLAN: After considering the risks, benefits, and limitations, Ms. Malkowski  provided informed consent to pursue genetic testing and the blood sample was sent to [facility] for analysis of the Breast Cancer STAT panel with plans to reflex to the Common Hereditary Cancers panel. Preliminary results should be available within approximately 5-12 days' time, at which point they Holt be disclosed by telephone to Ms. Muhlbauer, as Holt any additional recommendations warranted by these results. Ms. July Holt receive a summary of her genetic counseling visit and a copy of her results once available. This information Holt also be available in Epic. We encouraged Ms. Kilty to remain in contact  with cancer genetics annually so that we can continuously update the family history and inform her of any changes in cancer genetics and testing that may be of benefit for her family. Ms. Junio questions were answered to her satisfaction today. Our contact information was provided should additional questions or concerns arise.  Based on Ms. Mangels's family history, we recommended her paternal relatives, especially those affected with cancer, have genetic counseling and testing. Ms. Sachdeva Holt let us know if we can be of any assistance in coordinating genetic counseling and/or testing for this family member.   Lastly, we encouraged Ms. Wiegert to remain in contact with cancer  genetics annually so that we can continuously update the family history and inform her of any changes in cancer genetics and testing that may be of benefit for this family.   Ms.  Horsley questions were answered to her satisfaction today. Our contact information was provided should additional questions or concerns arise. Thank you for the referral and allowing [place] to share in the care of your patient.   Tana Felts, MS, [facility] Certified Genetic Counselor [email or web address] phone: [phone]  The patient was seen for a total of 35 minutes in face-to-face genetic counseling. This patient was discussed with Drs. Magrinat, Lindi Adie and/or Burr Medico who agrees with the above.

## 2017-07-12 NOTE — Progress Notes (Unsigned)
[place]  Telephone:([phone]) [phone] Fax:(336) [phone]     ID: Sicily Zaragoza DOB: [id number]  MR#: [id number]  ZHY#:[id number]  Patient Care Team: Alroy Dust, Carlean Jews.Marlou Sa, MD as PCP - General (Family Medicine) Jovita Kussmaul, MD as Consulting Physician (General Surgery) Jerra Huckeby, Virgie Dad, MD as Consulting Physician (Oncology) Kyung Rudd, MD as Consulting Physician (Radiation Oncology) OTHER MD:  CHIEF COMPLAINT: Estrogen receptor positive breast cancer  CURRENT TREATMENT: Definitive surgery pending   HISTORY OF CURRENT ILLNESS: Markeia Harkless had routine screening mammography on 06/14/2017 showing a possible area of calcifications in the right breast. She underwent unilateral right diagnostic mammography with tomography at [facility] on 06/23/2017 showing: Suspicious calcifications within the inferior right breast, spanning nearly 10 cm, more superior component spanning 4 cm extent. The more superior component may have a small associated mass. Ultrasonography of the right axilla on 07/01/2017 showed normal right axillary lymph nodes.   Accordingly on 06/29/2017 she proceeded to biopsy of the right breast area in question. The pathology from this procedure showed ([id number]): In the right breast lower central, more lateral superior: invasive ductal carcinoma grade I. In the lower central, more medial inferior: Ductal carcinoma in situ, high grade, with calcifications. The  invasive tumor was significant for estrogen receptor, 100% positive and progesterone receptor, 10% positive, both with strong staining intensity. Proliferation marker Ki67 at 5%. HER2  not amplified. The ductal carcinoma in situ was significant for estrogen receptor, 100% positive and progesterone receptor, 80% positive, both with strong staining intensity.   The patient's subsequent history is as detailed below.  INTERVAL HISTORY: Bailee was evaluated in the multidisciplinary breast cancer  clinic on 07/06/2017 accompanied by her daughter, Jeannene Patella. Her case was also presented at the multidisciplinary breast cancer conference on the same day. At that time a preliminary plan was proposed: Biopsy of the area between the prior biopsies for definitive surgical planning; consider MRI; consider Oncotype versus hormones only depending on patient's overall status; consider adjuvant radiation   REVIEW OF SYSTEMS: There were no specific symptoms leading to the original mammogram, which was routinely scheduled. She is still deciding weather to have a lumpectomy or mastectomy. The patient denies unusual headaches, visual changes, nausea, vomiting, stiff neck, dizziness, or gait imbalance. There has been no cough, phlegm production, or pleurisy, no chest pain or pressure, and no change in bowel or bladder habits. The patient denies fever, rash, bleeding, unexplained fatigue or unexplained weight loss. A detailed review of systems was otherwise entirely negative.  PAST MEDICAL HISTORY: Past Medical History:  Diagnosis Date  . Anxiety   . Dermatitis    HLD ( but not too high)   PAST SURGICAL HISTORY: Past Surgical History:  Procedure Laterality Date  . ABDOMINAL HYSTERECTOMY     Partial Hysterectomy without BSO. Because they were too close to the bladder.    FAMILY HISTORY Family History  Problem Relation Age of Onset  . Breast cancer Paternal Aunt   . Breast cancer Cousin    The patient's father died at age 50 due to emphysema. The patient's mother is alive at age 75 as of March 2019. The patient has 5 brothers and 3 sisters. There was a paternal great aunt with breast cancer. There was also a paternal cousin with breast cancer.   GYNECOLOGIC HISTORY:  No LMP recorded. Patient has had a hysterectomy. Menarche: 77 years old Age at first live birth: 77 years old The patient is GXP3. She is s/p partial hysterectomy. Both of her  ovaries remain. She used oral contraceptives with no  complications. She also used HRT for 2 years without complications.     SOCIAL HISTORY:  Hamna is now retired. 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 She is divorced. Her oldest daughter, [name]", is a [employer]th Land in [place], [place]. The patient's son, [name], works in a Proofreader in [place]. The patient's son, [name], works in Quarry manager in [place].  The patient has 4 grandchildren. She currently does not belong to a church.      ADVANCED DIRECTIVES: Not in place.  At the 07/07/2017 visit the patient was given the appropriate documents to complete and notarized at her discretion.  HEALTH MAINTENANCE: Social History   Tobacco Use  . Smoking status: Never Smoker  . Smokeless tobacco: Never Used  Substance Use Topics  . Alcohol use: Yes  . Drug use: Never     Colonoscopy: 02/2017 at [facility]  PAP:   Bone density: 2018/ osteopenia   No Known Allergies  Current Outpatient Medications  Medication Sig Dispense Refill  . Ascorbic Acid (VITAMIN C) 500 MG/5ML LIQD 1 tablet    . aspirin 325 MG tablet 1 tablet    . calcium gluconate in sodium chloride 0.9 % 25 mL 1 tablet after meals    . Omega-3 Fatty Acids (FISH OIL) 500 MG CAPS 1 capsule    . Pediatric Multivitamins-Fl (MULTIVITAMINS/FL PO) 1 tablet    . Red Yeast Rice 600 MG CAPS 1 or 2 capsules     No current facility-administered medications for this visit.     OBJECTIVE: Middle-aged white woman who appears well  There were no vitals filed for this visit.   There is no height or weight on file to calculate BMI.   Wt Readings from Last 3 Encounters:  07/07/17 177 lb 9.6 oz (80.6 kg)      ECOG FS:0 - Asymptomatic  Ocular: Sclerae unicteric, pupils round and equal Ear-nose-throat: Oropharynx clear and moist Lymphatic: No cervical or supraclavicular adenopathy Lungs no rales or rhonchi Heart regular rate and rhythm Abd soft, nontender, positive  bowel sounds MSK no focal spinal tenderness, no joint edema Neuro: non-focal, well-oriented, appropriate affect Breasts: The right breast shows a minimal ecchymosis.  There is no palpable mass.  The left breast is benign.  Both axillae are benign.   LAB RESULTS:  CMP  No results found for: NA, K, CL, CO2, GLUCOSE, BUN, CREATININE, CALCIUM, PROT, ALBUMIN, AST, ALT, ALKPHOS, BILITOT, GFRNONAA, GFRAA  No results found for: TOTALPROTELP, ALBUMINELP, A1GS, A2GS, BETS, BETA2SER, GAMS, MSPIKE, SPEI  No results found for: KPAFRELGTCHN, LAMBDASER, KAPLAMBRATIO  No results found for: WBC, NEUTROABS, HGB, HCT, MCV, PLT  _0 @  No results found for: LABCA2  No components found for: LGXQJJ941  No results for input(s): INR in the last 168 hours.  No results found for: LABCA2  No results found for: DEY814  No results found for: GYJ856  No results found for: DJS970  No results found for: CA2729  No components found for: HGQUANT  No results found for: CEA1 / No results found for: CEA1   No results found for: AFPTUMOR  No results found for: CHROMOGRNA  No results found for: PSA1  No visits with results within 3 Day(s) from this visit.  Latest known visit with results is:  No results found for any previous visit.    (this displays the last labs from the last  3 days)  No results found for: TOTALPROTELP, ALBUMINELP, A1GS, A2GS, BETS, BETA2SER, GAMS, MSPIKE, SPEI (this displays SPEP labs)  No results found for: KPAFRELGTCHN, LAMBDASER, KAPLAMBRATIO (kappa/lambda light chains)  No results found for: HGBA, HGBA2QUANT, HGBFQUANT, HGBSQUAN (Hemoglobinopathy evaluation)   No results found for: LDH  No results found for: IRON, TIBC, IRONPCTSAT (Iron and TIBC)  No results found for: FERRITIN  Urinalysis No results found for: COLORURINE, APPEARANCEUR, LABSPEC, PHURINE, GLUCOSEU, HGBUR, BILIRUBINUR, KETONESUR, PROTEINUR, UROBILINOGEN, NITRITE,  LEUKOCYTESUR   STUDIES: Mm Digital Diagnostic Unilat R  Result Date: 06/23/2017 CLINICAL DATA:  Patient returns today to evaluate right breast calcifications identified on recent screening mammogram. EXAM: DIGITAL DIAGNOSTIC RIGHT MAMMOGRAM WITH CAD COMPARISON:  Previous exams including recent screening mammogram dated 06/14/2017. ACR Breast Density Category c: The breast tissue is heterogeneously dense, which may obscure small masses. FINDINGS: On today's additional magnification views, 2 nearby groups of suspicious calcifications are confirmed within the lower right breast, with an overall segmental distribution. The more inferior group of pleomorphic and amorphous calcifications have an overall segmental distribution spanning 9.7 cm extent from posterior to anterior depth, with a suspicious linear/ductal component posteriorly. The most suspicious components are seen at its most posterior and anterior aspects. The slightly more superior group of pleomorphic and amorphous calcifications span approximately 4 cm extent. The most suspicious component is seen at its posterior aspect, with possible small associated mass. Mammographic images were processed with CAD. IMPRESSION: Suspicious calcifications within the inferior right breast, anterior to posterior extent, overall segmental distribution, more inferior component spanning nearly 10 cm, more superior component spanning 4 cm extent. The more superior component may have a small associated mass. Stereotactic biopsy recommended for 2 sites. RECOMMENDATION: 1. Stereotactic biopsy of the slightly more superior-lateral group of pleomorphic and amorphous calcifications within the lower right breast, spanning 4 cm extent. Recommend biopsy sampling of the the most suspicious component which is at its posterior aspect, with possible associated mass. 2. Stereotactic biopsy of the more inferior group of pleomorphic and amorphous calcifications, spanning 9.7 cm extent  from posterior to anterior depth. Recommend biopsy at either the most posterior or anterior extent of these calcifications. The most anterior calcifications, however, may not be accessible with stereotactic biopsy. Stereotactic biopsies are scheduled for March 19th. I have discussed the findings and recommendations with the patient. Results were also provided in writing at the conclusion of the visit. If applicable, a reminder letter will be sent to the patient regarding the next appointment. BI-RADS CATEGORY  4: Suspicious. Electronically Signed   By: Franki Cabot M.D.   On: 06/23/2017 13:10   Mm Screening Breast Tomo Bilateral  Result Date: 06/15/2017 CLINICAL DATA:  Screening. EXAM: DIGITAL SCREENING BILATERAL MAMMOGRAM WITH TOMO AND CAD COMPARISON:  Previous exam(s). ACR Breast Density Category c: The breast tissue is heterogeneously dense, which may obscure small masses. FINDINGS: In the right breast, calcifications warrant further evaluation with magnified views. In the left breast, no findings suspicious for malignancy. Images were processed with CAD. IMPRESSION: Further evaluation is suggested for calcifications in the right breast. RECOMMENDATION: Diagnostic mammogram of the right breast. (Code:[id number]) The patient will be contacted regarding the findings, and additional imaging will be scheduled. BI-RADS CATEGORY  0: Incomplete. Need additional imaging evaluation and/or prior mammograms for comparison. Electronically Signed   By: Evangeline Dakin M.D.   On: 06/15/2017 07:43   [place] Axilla Right  Result Date: 07/01/2017 CLINICAL DATA:  77 year old patient recently diagnosed with ductal carcinoma in situ and  invasive ductal carcinoma following stereotactic biopsy of 2 areas in the right breast. She presents for ultrasound evaluation of the right axilla. EXAM: ULTRASOUND OF THE RIGHT AXILLA COMPARISON:  Previous exam(s). FINDINGS: Targeted ultrasound is performed, showing normal right axillary lymph  nodes with normal fatty hila. No enlarged lymph nodes or abnormal cortical thickening is identified to suggest axillary nodal metastatic disease. IMPRESSION: Normal right axillary lymph nodes. RECOMMENDATION: Treatment planning for known right breast cancer. The patient is scheduled to be seen at the multidisciplinary breast cancer clinic next week. I have discussed the findings and recommendations with the patient. Results were also provided in writing at the conclusion of the visit. If applicable, a reminder letter will be sent to the patient regarding the next appointment. BI-RADS CATEGORY  1: Negative. Electronically Signed   By: Curlene Dolphin M.D.   On: 07/01/2017 12:55   Mm Clip Placement Right  Result Date: 07/09/2017 CLINICAL DATA:  Evaluate clip placement following stereotactic guided biopsy of anterior LOWER OUTER RIGHT breast calcifications. Recently diagnosed DCIS and invasive mammary carcinoma within the posterior RIGHT breast. EXAM: DIAGNOSTIC RIGHT MAMMOGRAM POST STEREOTACTIC BIOPSY COMPARISON:  Previous exam(s). FINDINGS: Mammographic images were obtained following stereotactic guided biopsy of calcifications within the anterior LOWER OUTER RIGHT breast. The COIL shaped clip is in satisfactory position within the anterior LOWER OUTER RIGHT breast. The COIL clip placed today lies 10 cm anterior to the previously placed posterior RIGHT breast COIL and X shaped clips demarcating biopsy-proven DCIS and invasive mammary carcinoma. IMPRESSION: Satisfactory COIL clip placement within the anterior LOWER OUTER RIGHT breast following stereotactic guided RIGHT breast biopsy. Final Assessment: Post Procedure Mammograms for Marker Placement Electronically Signed   By: Margarette Canada M.D.   On: 07/09/2017 11:12   Mm Clip Placement Right  Result Date: 06/29/2017 CLINICAL DATA:  Patient Is post stereotactic core needle biopsy of 2 groups of microcalcifications over the lower central right breast. EXAM:  DIAGNOSTIC right MAMMOGRAM POST stereotactic BIOPSY COMPARISON:  Previous exam(s). FINDINGS: Mammographic images were obtained following stereotactic guided biopsy of 2 groups of microcalcifications over the lower central right breast. Images demonstrates satisfactory placement of a coil shaped metallic clip over the more lateral superior group of microcalcifications and satisfactory placement of a X shaped metallic clip over the more medial inferior group of microcalcifications. IMPRESSION: Satisfactory clip placement post stereotactic core needle biopsy 2 groups of right breast microcalcifications. Final Assessment: Post Procedure Mammograms for Marker Placement Electronically Signed   By: Marin Olp M.D.   On: 06/29/2017 12:45   Mm Rt Breast Bx W Loc Dev 1st Lesion Image Bx Spec Stereo Guide  Addendum Date: 06/30/2017   ADDENDUM REPORT: 06/30/2017 14:57 ADDENDUM: Pathology revealed GRADE I INVASIVE DUCTAL CARCINOMA, DUCTAL CARCINOMA IN SITU WITH CALCIFICATIONS of the Right breast, lower central, more lateral superior. HIGH GRADE DUCTAL CARCINOMA IN SITU WITH CALCIFICATIONS of the Right breast, lower central, more medial inferior. This was found to be concordant by Dr. Marin Olp. Pathology results were discussed with the patient by telephone. The patient reported doing well after the biopsies with tenderness at the sites. Post biopsy instructions and care were reviewed and questions were answered. The patient was encouraged to call [facility] for any additional concerns. The patient was referred to [facility] on July 07, 2017. The patient is scheduled for a Right axillary ultrasound on July 01, 2017 for evaluation of her lymph nodes  due to the invasive diagnosis. If breast conservation is a consideration, a biopsy of the anterior calcifications in the Right breast should be considered for further  evaluation of extent of disease. Pathology results reported by Terie Purser, RN on 06/30/2017. Electronically Signed   By: Marin Olp M.D.   On: 06/30/2017 14:57   Result Date: 06/30/2017 CLINICAL DATA:  Patient presents for stereotactic core needle biopsy of 2 groups of microcalcifications over the lower central right breast. First biopsy site is at the more lateral and superior group of microcalcifications in the second biopsy site is at the more medial and inferior group of microcalcifications. EXAM: RIGHT BREAST STEREOTACTIC CORE NEEDLE BIOPSY COMPARISON:  Previous exams. FINDINGS: The patient and I discussed the procedure of stereotactic-guided biopsy including benefits and alternatives. We discussed the high likelihood of a successful procedure. We discussed the risks of the procedure including infection, bleeding, tissue injury, clip migration, and inadequate sampling. Informed written consent was given. The usual time out protocol was performed immediately prior to the procedure. Using sterile technique and 1% Lidocaine as local anesthetic, under stereotactic guidance, a 9 gauge vacuum assisted device was used to perform core needle biopsy of the targeted microcalcifications over the lower central right breast involving the more lateral superior group using a medial to lateral approach. Specimen radiograph was performed showing several of the targeted microcalcifications. Specimens with calcifications are identified for pathology. Lesion quadrant: Right lower inner quadrant at approximately 5:30 position. At the conclusion of the procedure, a coil shaped tissue marker clip was deployed into the biopsy cavity. Follow-up 2-view mammogram was performed and dictated separately. Using sterile technique and 1% Lidocaine as local anesthetic, under stereotactic guidance, a 9 gauge vacuum assisted device was used to perform core needle biopsy of the targeted microcalcifications over the lower central right breast  involving the more medial inferior group using a medial to lateral approach. Specimen radiograph was performed showing multiple of the targeted microcalcifications. Specimens with calcifications are identified for pathology. Lesion quadrant: Right outer lower quadrant at approximately the 6:30 position. At the conclusion of the procedure, a X shaped tissue marker clip was deployed into the biopsy cavity. Follow-up 2-view mammogram was performed and dictated separately. IMPRESSION: Stereotactic-guided biopsy of 2 groups of right breast microcalcifications as described. No apparent complications. Electronically Signed: By: Marin Olp M.D. On: 06/29/2017 12:43   Mm Rt Breast Bx W Loc Dev Ea Ad Lesion Img Bx Spec Stereo Guide  Addendum Date: 06/30/2017   ADDENDUM REPORT: 06/30/2017 14:57 ADDENDUM: Pathology revealed GRADE I INVASIVE DUCTAL CARCINOMA, DUCTAL CARCINOMA IN SITU WITH CALCIFICATIONS of the Right breast, lower central, more lateral superior. HIGH GRADE DUCTAL CARCINOMA IN SITU WITH CALCIFICATIONS of the Right breast, lower central, more medial inferior. This was found to be concordant by Dr. Marin Olp. Pathology results were discussed with the patient by telephone. The patient reported doing well after the biopsies with tenderness at the sites. Post biopsy instructions and care were reviewed and questions were answered. The patient was encouraged to call [facility] for any additional concerns. The patient was referred to [facility] on July 07, 2017. The patient is scheduled for a Right axillary ultrasound on July 01, 2017 for evaluation of her lymph nodes due to the invasive diagnosis. If breast conservation is a consideration, a biopsy of the anterior calcifications in the Right breast should be considered for further evaluation of extent  of disease. Pathology results reported by Terie Purser, RN on 06/30/2017. Electronically Signed   By: Marin Olp M.D.   On: 06/30/2017 14:57   Result Date: 06/30/2017 CLINICAL DATA:  Patient presents for stereotactic core needle biopsy of 2 groups of microcalcifications over the lower central right breast. First biopsy site is at the more lateral and superior group of microcalcifications in the second biopsy site is at the more medial and inferior group of microcalcifications. EXAM: RIGHT BREAST STEREOTACTIC CORE NEEDLE BIOPSY COMPARISON:  Previous exams. FINDINGS: The patient and I discussed the procedure of stereotactic-guided biopsy including benefits and alternatives. We discussed the high likelihood of a successful procedure. We discussed the risks of the procedure including infection, bleeding, tissue injury, clip migration, and inadequate sampling. Informed written consent was given. The usual time out protocol was performed immediately prior to the procedure. Using sterile technique and 1% Lidocaine as local anesthetic, under stereotactic guidance, a 9 gauge vacuum assisted device was used to perform core needle biopsy of the targeted microcalcifications over the lower central right breast involving the more lateral superior group using a medial to lateral approach. Specimen radiograph was performed showing several of the targeted microcalcifications. Specimens with calcifications are identified for pathology. Lesion quadrant: Right lower inner quadrant at approximately 5:30 position. At the conclusion of the procedure, a coil shaped tissue marker clip was deployed into the biopsy cavity. Follow-up 2-view mammogram was performed and dictated separately. Using sterile technique and 1% Lidocaine as local anesthetic, under stereotactic guidance, a 9 gauge vacuum assisted device was used to perform core needle biopsy of the targeted microcalcifications over the lower central right breast involving the more medial inferior group using a medial to lateral  approach. Specimen radiograph was performed showing multiple of the targeted microcalcifications. Specimens with calcifications are identified for pathology. Lesion quadrant: Right outer lower quadrant at approximately the 6:30 position. At the conclusion of the procedure, a X shaped tissue marker clip was deployed into the biopsy cavity. Follow-up 2-view mammogram was performed and dictated separately. IMPRESSION: Stereotactic-guided biopsy of 2 groups of right breast microcalcifications as described. No apparent complications. Electronically Signed: By: Marin Olp M.D. On: 06/29/2017 12:43    ELIGIBLE FOR AVAILABLE RESEARCH PROTOCOL: exact sciences study  ASSESSMENT: 77 y.o. [place], [place] woman status post right breast biopsy 07/01/2017 for a clinical mT2 N0, stage IB invasive ductal carcinoma, grade 1, estrogen and progesterone receptor positive, HER-2 nonamplified, with an MIB-105%  (1) a second area in the right breast biopsied 07/01/2017 showed ductal carcinoma in situ, grade 3, estrogen and progesterone receptor positive.  (2) definitive surgery pending  (3) Oncotype DX to be obtained from the final surgical sample: Chemotherapy not anticipated  (4) adjuvant radiation as appropriate  (5) antiestrogens at the completion of local treatment  (6) genetics testing pending  PLAN: We spent the better part of today's hour-long appointment discussing the biology of her diagnosis and the specifics of her situation. We first reviewed the fact that cancer is not one disease but more than 100 different diseases and that it is important to keep them separate-- otherwise when friends and relatives discuss their own cancer experiences with [place] confusion can result. Similarly we explained that if breast cancer spreads to the bone or liver, the patient would not have bone cancer or liver cancer, but breast cancer in the bone and breast cancer in the liver: one cancer in three places-- not 3  different cancers which otherwise would have to be  treated in 3 different ways.  We discussed the difference between local and systemic therapy. In terms of loco-regional treatment, lumpectomy plus radiation is equivalent to mastectomy as far as survival is concerned. For this reason, and because the cosmetic results are generally superior, we generally recommend breast conserving surgery.  However in Natsha's case there is a real question about cosmetic results which she continues to discuss with Dr. Marlou Starks.  She has not yet made up her mind whether she wants mastectomy with or without reconstruction or lumpectomy and radiation  We then discussed the rationale for systemic therapy. There is some risk that this cancer may have already spread to other parts of her body. Patients frequently ask at this point about bone scans, CAT scans and PET scans to find out if they have occult breast cancer somewhere else. The problem is that in early stage disease we are much more likely to find false positives then true cancers and this would expose the patient to unnecessary procedures as well as unnecessary radiation. Scans cannot answer the question the patient really would like to know, which is whether she has microscopic disease elsewhere in her body. For those reasons we do not recommend them.  Of course we would proceed to aggressive evaluation of any symptoms that might suggest metastatic disease, but that is not the case here.  Next we went over the options for systemic therapy which are anti-estrogens, anti-HER-2 immunotherapy, and chemotherapy. Ellerie does not meet criteria for anti-HER-2 immunotherapy. She is a good candidate for anti-estrogens.  The question of chemotherapy is more complicated. Chemotherapy is most effective in rapidly growing, aggressive tumors. It is much less effective in low-grade, slow growing cancers, like Yoshika 's. For that reason we are going to request an Oncotype from the  definitive surgical sample, as suggested by NCCN guidelines. That will help [place] make a definitive decision regarding chemotherapy in this case.  The general sequence then is surgery, whether lumpectomy plus radiation or mastectomy, then Oncotype testing, which I expect to be "low risk", then radiation if the patient chooses lumpectomy, and then antiestrogens.  I did give Rosalee a copy of the healthcare power of attorney form to complete on notarized and encouraged her to get that done before her return visit with me.  Rees has a good understanding of the overall plan. She agrees with it. She knows the goal of treatment in her case is cure. She will call with any problems that may develop before her next visit here.   Palma Buster, Virgie Dad, MD  07/12/17 7:34 AM Medical Oncology and Hematology [facility] [address] [place], [place] [address] Tel. [phone]    Fax. [phone]  This document serves as a record of services personally performed by Lurline Del, MD. It was created on his behalf by Sheron Nightingale, a trained medical scribe. The creation of this record is based on the scribe's personal observations and the provider's statements to them.   I have reviewed the above documentation for accuracy and completeness, and I agree with the above.

## 2017-07-12 NOTE — Progress Notes (Signed)
[place] Psychosocial Distress Screening [place] presented to [facility] on 07-07-2017. Per navigator, Ms Rabalais was very overwhelmed by information at [facility] and preferred later f/u instead of [place] team contact at clinic. Attempted to reach by phone to introduce [place] team/resources and review distress screen per protocol.  The patient scored a 5 on the Psychosocial Distress Thermometer which indicates moderate distress. LVM encouraging callback.  ON[facility] DISTRESS SCREENING 07/12/2017  Screening Type Initial Screening  Distress experienced in past week (1-10) 5  Spiritual/Religous concerns type Relating to God  Information Concerns Type Lack of info about diagnosis;Lack of info about treatment;Lack of info about complementary therapy choices;Lack of info about maintaining fitness  Physical Problem type Tingling hands/feet;Skin dry/itchy  Referral to support programs Yes    Follow up needed: Yes.  Plan to f/u by phone for further needs assessment if pt does not return call.   [place], [place], [facility] Pager [phone] Voicemail [phone]

## 2017-07-13 ENCOUNTER — Encounter: Payer: Self-pay | Admitting: General Practice

## 2017-07-13 NOTE — Progress Notes (Signed)
[place] Spiritual Care Note  LVM, encouraging pt to return call.   [employer], [place], [facility] Pager [phone] Voicemail [phone]

## 2017-07-14 ENCOUNTER — Telehealth: Payer: Self-pay | Admitting: *Deleted

## 2017-07-14 NOTE — Telephone Encounter (Signed)
Spoke to pt concerning [place] from 3.27.19. Denies questions or concerns regarding dx or treatment care plan. Encourage pt to call with needs. Received verbal understanding. Pt relate she wishes to wait for sx to be scheduled until she has genetic testing results.

## 2017-07-19 ENCOUNTER — Telehealth: Payer: Self-pay | Admitting: Genetics

## 2017-07-19 ENCOUNTER — Inpatient Hospital Stay: Payer: Medicare HMO

## 2017-07-19 DIAGNOSIS — Z170 Estrogen receptor positive status [ER+]: Principal | ICD-10-CM

## 2017-07-19 DIAGNOSIS — C50311 Malignant neoplasm of lower-inner quadrant of right female breast: Secondary | ICD-10-CM

## 2017-07-19 NOTE — Telephone Encounter (Signed)
Revealed negative Preliminary (STAT panel) genetic test results.  These are the high rand moderate risk breast cancer risk genes that may impact breast surgical recommendations.  We will still wait for the results of the full panel, but she should be able to proceed with planning her breast surgery with these results.  Recommended her paternal relatives, especially those affected with cancer, also have genetic testing.

## 2017-07-21 ENCOUNTER — Telehealth: Payer: Self-pay | Admitting: Adult Health

## 2017-07-21 DIAGNOSIS — Z170 Estrogen receptor positive status [ER+]: Secondary | ICD-10-CM | POA: Diagnosis not present

## 2017-07-21 DIAGNOSIS — C50311 Malignant neoplasm of lower-inner quadrant of right female breast: Secondary | ICD-10-CM | POA: Diagnosis not present

## 2017-07-21 NOTE — Telephone Encounter (Signed)
Patient called wanting to schedule an earlier appointment

## 2017-07-22 NOTE — Progress Notes (Signed)
07/22/17 @ 1035: Informed by research specialist that Kit # for this patient was not correct. New Kit # [id number].  L. , RN, BSN Clinical Research Nurse  

## 2017-07-23 ENCOUNTER — Encounter: Payer: Self-pay | Admitting: Genetics

## 2017-07-23 ENCOUNTER — Telehealth: Payer: Self-pay | Admitting: Genetics

## 2017-07-23 ENCOUNTER — Ambulatory Visit: Payer: Self-pay | Admitting: Genetics

## 2017-07-23 DIAGNOSIS — Z803 Family history of malignant neoplasm of breast: Secondary | ICD-10-CM

## 2017-07-23 DIAGNOSIS — Z8042 Family history of malignant neoplasm of prostate: Secondary | ICD-10-CM

## 2017-07-23 DIAGNOSIS — Z1379 Encounter for other screening for genetic and chromosomal anomalies: Secondary | ICD-10-CM | POA: Insufficient documentation

## 2017-07-23 DIAGNOSIS — C50311 Malignant neoplasm of lower-inner quadrant of right female breast: Secondary | ICD-10-CM

## 2017-07-23 DIAGNOSIS — Z170 Estrogen receptor positive status [ER+]: Secondary | ICD-10-CM

## 2017-07-23 NOTE — Telephone Encounter (Signed)
Revealed negative genetic testing.  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   This normal result is reassuring and indicates that it is unlikely Lauren Holt's cancer is due to a hereditary cause.  It is unlikely that there is an increased risk of another cancer due to a mutation in one of these genes.  However, genetic testing is not perfect, and cannot definitively rule out a hereditary cause.  It will be important for her to keep in contact with genetics to learn if any additional testing may be needed in the future.   We recommended siblings and all paternal relatives have genetic testing because there could still be a genetic cause for the cancer in the family that Ms. Hauser did not inherit that could be present in other relatives.

## 2017-07-23 NOTE — Progress Notes (Signed)
HPI: Lauren Holt was previously seen in the [facility] clinic on 07/12/2017 due to a personal and family history of cancer and concerns regarding a hereditary predisposition to cancer. Please refer to our prior cancer genetics clinic note for more information regarding Lauren Holt's medical, social and family histories, and our assessment and recommendations, at the time. Lauren Holt recent genetic test results were disclosed to her, as well as recommendations warranted by these results. These results and recommendations are discussed in more detail below.  CANCER HISTORY:   No history exists.     FAMILY HISTORY:  We obtained a detailed, 4-generation family history.  Significant diagnoses are listed below: Family History  Problem Relation Age of Onset  . Breast cancer Paternal Aunt   . Breast cancer Cousin     Lauren Holt has a daughter and 2 sons int heir 60's with no history of cancer.  Lauren Holt has 4 grandchildren with no histoyr of cancer.  Lauren Holt has 3 sisters and 5 brothers: -1 brother is a maternal half-brother in his 70's with no history of cancer.  -4 full brothers in their 28's with no history of cance.r  -1 sister is 68 with no history of cancer.  -2 sisters in their 35's with no history of cancer.  No nieces or nephews with any history of cancer.   Lauren Holt father died at 65 due to Emphysema.  He had prostate cancer in his 95's that was never treated (slow growing).  He also had some precancerous polyps identified in his lifetime. Lauren Holt has 4 paternal uncles with no history of cancer.   -1 uncle had 2 daughters who developed breast cacner (one in her 80's and the other in her 25's).   -1 uncle had a daughter who died of breast cancer in her 12's.  -1 uncle had a daughter who developed breast cancer I her late 71's.  Lauren Holt paternal grandfather died in his 39's with no history of cancer.  Lauren Holt paternal  grandmother died in her 59s with no history of cancer.  She had a sister (patient's great aunt) who had breast cancer.   Lauren Holt mother is 41 with no history of cancer. Lauren Holt had 2 maternal aunts who are deceased with no history of cancer.  Lauren Holt has 1 maternal uncle in his 30's with no history of cancer,and 3 maternal uncles who are deceased with no history of cancer. No maternal cousins with any history of cancer.  Lauren Holt maternal grandfather died when her mother was very young- no cancer.  Lauren Holt maternal grandmother died at 53 with no history of cancer.   Lauren Holt is unaware of previous family history of genetic testing for hereditary cancer risks. Patient's maternal ancestors are of Northern European/Mexican/Spanish/Native american descent, and paternal ancestors are of English/Swedish descent. There is no reported Ashkenazi Jewish ancestry. There is no known consanguinity.   GENETIC TEST RESULTS: Genetic testing performed through [facility]'s Common Hereditary Cancers Panel reported out on 07/22/2017 showed no pathogenic mutations. The Common Hereditary Cancer Panel offered by [facility] includes sequencing and/or deletion duplication testing of the following 47 genes: APC, ATM, AXIN2, BARD1, BMPR1A, BRCA1, BRCA2, BRIP1, CDH1, CDKN2A (p14ARF), CDKN2A (p16INK4a), CKD4, CHEK2, CTNNA1, DICER1, EPCAM (Deletion/duplication testing only), GREM1 (promoter region deletion/duplication testing only), KIT, MEN1, MLH1, MSH2, MSH3, MSH6, MUTYH, NBN, NF1, NHTL1, PALB2, PDGFRA, PMS2, POLD1, POLE, PTEN, RAD50, RAD51C, RAD51D, SDHB, SDHC, SDHD, SMAD4, SMARCA4. STK11, TP53, TSC1, TSC2, and  VHL.  The following genes were evaluated for sequence changes only: SDHA and HOXB13 c.251G>A variant only..  A variant of uncertain significance (VUS) in a gene called DICER1 was also noted. c.2116+6G>A (Intronic)  The test report will be scanned into EPIC and will be located under the  Molecular Pathology section of the Results Review tab.A portion of the result report is included below for reference.     We discussed with Lauren Holt that because current genetic testing is not perfect, it is possible there may be a gene mutation in one of these genes that current testing cannot detect, but that chance is small. We also discussed, that there could be another gene that has not yet been discovered, or that we have not yet tested, that is responsible for the cancer diagnoses in the family. It is also possible there is a hereditary cause for the cancer in the family that Lauren Holt did not inherit and therefore was not identified in her testing.  Therefore, it is important to remain in touch with cancer genetics in the future so that we can continue to offer Ms. Howden the most up to date genetic testing.   Regarding the VUS in DICER1: At this time, it is unknown if this variant is associated with increased cancer risk or if this is a normal finding, but most variants such as this get reclassified to being inconsequential. It should not be used to make medical management decisions. With time, we suspect the lab will determine the significance of this variant, if any. If we do learn more about it, we will try to contact Ms. Glanz to discuss it further. However, it is important to stay in touch with [place] periodically and keep the address and phone number up to date.  ADDITIONAL GENETIC TESTING: We discussed with Ms. Prunty that there are other genes that are associated with increased cancer risk that can be analyzed. The laboratories that offer this testing look at these additional genes via a hereditary cancer gene panel. Should Ms. Benevides wish to pursue additional genetic testing, we are happy to discuss and coordinate this testing, at any time.    CANCER SCREENING RECOMMENDATIONS: Ms. Crepeau test result is considered negative (normal).  This means that we have not  identified a hereditary cause for her personal and family history of cancer at this time.   While reassuring, this does not definitively rule out a hereditary basis for her cancer. It is still possible that there could be genetic mutations that are undetectable by current technology, or genetic mutations in genes that have not been tested or identified to increase cancer risk.    Therefore, Ms. Bump was advised to continue following the cancer screening guidelines provided by her primary healthcare providers. Other factors such as her personal and family history may still affect her cancer risk.    RECOMMENDATIONS FOR FAMILY MEMBERS: Individuals in this family might be at some increased risk of developing cancer, over the general population risk, simply due to the family history of cancer. We recommended women in this family have a yearly mammogram beginning at age 60, or 66 years younger than the earliest onset of cancer, an annual clinical breast exam, and perform monthly breast self-exams. Women in this family should also have a gynecological exam as recommended by their primary provider. All family members should have a colonoscopy by age 32.  All family members should inform their physicians about the family history of cancer so their doctors can  make the most appropriate screening recommendations for them.   Based on Ms. Hoppes's family history, we recommended her siblings and all paternal relatives, have genetic counseling and testing. Ms. Mckeen will let us know if we can be of any assistance in coordinating genetic counseling and/or testing for *these family members.   FOLLOW-UP: Lastly, we discussed with Ms. Pennings that cancer genetics is a rapidly advancing field and it is possible that new genetic tests will be appropriate for her and/or her family members in the future. We encouraged her to remain in contact with cancer genetics on an annual basis so we can update her personal  and family histories and let her know of advances in cancer genetics that may benefit this family.   Our contact number was provided. Ms. Haskins questions were answered to her satisfaction, and she knows she is welcome to call us at anytime with additional questions or concerns.   Ferol Luz, MS, [facility] Certified Genetic Counselor [email or web address]

## 2017-07-25 ENCOUNTER — Other Ambulatory Visit: Payer: Self-pay | Admitting: Oncology

## 2017-07-28 ENCOUNTER — Ambulatory Visit: Payer: Self-pay | Admitting: General Surgery

## 2017-07-28 DIAGNOSIS — C50311 Malignant neoplasm of lower-inner quadrant of right female breast: Secondary | ICD-10-CM

## 2017-07-28 DIAGNOSIS — Z170 Estrogen receptor positive status [ER+]: Principal | ICD-10-CM

## 2017-07-28 LAB — RESEARCH LABS

## 2017-08-04 ENCOUNTER — Encounter: Payer: Self-pay | Admitting: Adult Health

## 2017-08-04 ENCOUNTER — Inpatient Hospital Stay: Payer: Medicare HMO | Admitting: Adult Health

## 2017-08-04 VITALS — BP 116/71 | HR 85 | Temp 99.9°F | Resp 18 | Ht 63.5 in | Wt 175.9 lb

## 2017-08-04 DIAGNOSIS — Z170 Estrogen receptor positive status [ER+]: Secondary | ICD-10-CM

## 2017-08-04 DIAGNOSIS — C50311 Malignant neoplasm of lower-inner quadrant of right female breast: Secondary | ICD-10-CM

## 2017-08-04 MED ORDER — ANASTROZOLE 1 MG PO TABS: 1.0000 mg | ORAL_TABLET | Freq: Every day | ORAL | 5 refills | Status: DC

## 2017-08-04 NOTE — Progress Notes (Signed)
Lauren Holt  [phone] Fax:(336) [phone]     ID: Lauren Holt DOB: April 10, 1941  MR#: [id number]  QQV#:[id number]  Patient Care Team: Alroy Dust, Carlean Jews.Marlou Sa, MD as PCP - General (Family Medicine) Jovita Kussmaul, MD as Consulting Physician (General Surgery) Magrinat, Virgie Dad, MD as Consulting Physician (Oncology) Kyung Rudd, MD as Consulting Physician (Radiation Oncology) OTHER MD:  CHIEF COMPLAINT: Estrogen receptor positive breast cancer  CURRENT TREATMENT: Definitive surgery pending   HISTORY OF CURRENT ILLNESS: Lauren Holt had routine screening mammography on 06/14/2017 showing a possible area of calcifications in the right breast. She underwent unilateral right diagnostic mammography with tomography at [facility] on 06/23/2017 showing: Suspicious calcifications within the inferior right breast, spanning nearly 10 cm, more superior component spanning 4 cm extent. The more superior component may have a small associated mass. Ultrasonography of the right axilla on 07/01/2017 showed normal right axillary lymph nodes.   Accordingly on 06/29/2017 she proceeded to biopsy of the right breast area in question. The pathology from this procedure showed ([id number]): In the right breast lower central, more lateral superior: invasive ductal carcinoma grade I. In the lower central, more medial inferior: Ductal carcinoma in situ, high grade, with calcifications. The  invasive tumor was significant for estrogen receptor, 100% positive and progesterone receptor, 10% positive, both with strong staining intensity. Proliferation marker Ki67 at 5%. HER2  not amplified. The ductal carcinoma in situ was significant for estrogen receptor, 100% positive and progesterone receptor, 80% positive, both with strong staining intensity.   The patient's subsequent history is as detailed below.  INTERVAL HISTORY: Lauren Holt is here today for f/u regarding her Estrogen positive  breast cancer.  She is going to undergo right mastectomy with reconstruction by Dr. Iran Planas.  She is also going to have a breast reduction during implant exchange as well.  Her last bone density test was about 1 year ago with [facility].  She cannot recall the results, but thinks she has osteopenia.  Her surgery is scheduled 09/24/2017.  REVIEW OF SYSTEMS: [name] is feeling well today.  She denies any issues.  A detailed ROS was non contributory today.    PAST MEDICAL HISTORY: Past Medical History:  Diagnosis Date  . Anxiety   . Dermatitis   . Family history of breast cancer 07/12/2017  . Family history of prostate cancer 07/12/2017  . Genetic testing    HLD ( but not too high)   PAST SURGICAL HISTORY: Past Surgical History:  Procedure Laterality Date  . ABDOMINAL HYSTERECTOMY     Partial Hysterectomy without BSO. Because they were too close to the bladder.    FAMILY HISTORY Family History  Problem Relation Age of Onset  . Breast cancer Paternal Aunt   . Breast cancer Cousin 49  . Prostate cancer Father 59  . Breast cancer Cousin 45  . Breast cancer Cousin 25  . Breast cancer Cousin 39  . Breast cancer Other    The patient's father died at age 58 due to emphysema. The patient's mother is alive at age 54 as of March 2019. The patient has 5 brothers and 3 sisters. There was a paternal great aunt with breast cancer. There was also a paternal cousin with breast cancer.   GYNECOLOGIC HISTORY:  No LMP recorded. Patient has had a hysterectomy. Menarche: 22 years Lauren Age at first live birth: 39 years Lauren The patient is GXP3. She is s/p partial hysterectomy. Both of her ovaries remain. She used oral contraceptives with  no complications. She also used HRT for 2 years without complications.     SOCIAL HISTORY:  Lauren Holt is now retired. 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 She is divorced. Her oldest daughter, Lauren Holt", is a [employer]th Land in [place], [place]. The patient's son, Lauren Holt, works in a Proofreader in [place]. The patient's son, Lauren Holt, works in Quarry manager in [place].  The patient has 4 grandchildren. She currently does not belong to a church.      ADVANCED DIRECTIVES: Not in place.  At the 07/07/2017 visit the patient was given the appropriate documents to complete and notarized at her discretion.  HEALTH MAINTENANCE: Social History   Tobacco Use  . Smoking status: Never Smoker  . Smokeless tobacco: Never Used  . Tobacco comment: Smoked X 1 year at age 64 (less than ppd)  Substance Use Topics  . Alcohol use: Yes    Alcohol/week: 1.2 oz    Types: 2 Glasses of wine per week    Comment: Started 1960  . Drug use: Never     Colonoscopy: 02/2017 at [facility]  PAP:   Bone density: 2018/ osteopenia   No Known Allergies  Current Outpatient Medications  Medication Sig Dispense Refill  . Ascorbic Acid (VITAMIN C) 500 MG/5ML LIQD 1 tablet    . calcium gluconate in sodium chloride 0.9 % 25 mL 1 tablet after meals    . ALPRAZolam (XANAX) 0.25 MG tablet     . anastrozole (ARIMIDEX) 1 MG tablet Take 1 tablet (1 mg total) by mouth daily. 30 tablet 5  . aspirin 325 MG tablet 1 tablet    . citalopram (CELEXA) 10 MG tablet     . Lutein 20 MG CAPS Take by mouth.    . metroNIDAZOLE (METROGEL) 0.75 % gel     . Multiple Vitamins-Minerals (ICAPS AREDS FORMULA PO) Take by mouth.    . Pediatric Multivitamins-Fl (MULTIVITAMINS/FL PO) 1 tablet    . Red Yeast Rice 600 MG CAPS 1 or 2 capsules     No current facility-administered medications for this visit.     OBJECTIVE:   Vitals:   08/04/17 1045  BP: 116/71  Pulse: 85  Resp: 18  Temp: 99.9 F (37.7 C)  SpO2: 96%     Body mass index is 30.67 kg/m.   Wt Readings from Last 3 Encounters:  08/04/17 175 lb 14.4 oz (79.8 kg)  07/07/17 177 lb 9.6 oz (80.6 kg)      ECOG FS:0 - Asymptomatic GENERAL: Patient is a well appearing  female in no acute distress HEENT:  Sclerae anicteric.  Oropharynx clear and moist. No ulcerations or evidence of oropharyngeal candidiasis. Neck is supple.  NODES:  No cervical, supraclavicular, or axillary lymphadenopathy palpated.  BREAST EXAM:  Unable to palpate any abnormalities in right breast, left breast without nodules, masses, skin/nipple lesions LUNGS:  Clear to auscultation bilaterally.  No wheezes or rhonchi. HEART:  Regular rate and rhythm. No murmur appreciated. ABDOMEN:  Soft, nontender.  Positive, normoactive bowel sounds. No organomegaly palpated. MSK:  No focal spinal tenderness to palpation. Full range of motion bilaterally in the upper extremities. EXTREMITIES:  No peripheral edema.   SKIN:  Clear with no obvious rashes or skin changes. No nail dyscrasia. NEURO:  Nonfocal. Well oriented.  Appropriate affect.     LAB RESULTS:  CMP  No results found for: NA, K, CL, CO2, GLUCOSE, BUN, CREATININE, CALCIUM, PROT, ALBUMIN,  AST, ALT, ALKPHOS, BILITOT, GFRNONAA, GFRAA  No results found for: TOTALPROTELP, ALBUMINELP, A1GS, A2GS, BETS, BETA2SER, GAMS, MSPIKE, SPEI  No results found for: KPAFRELGTCHN, LAMBDASER, KAPLAMBRATIO  No results found for: WBC, NEUTROABS, HGB, HCT, MCV, PLT  '@LASTCHEMISTRY'$ @  No results found for: LABCA2  No components found for: XTAVWP794  No results for input(s): INR in the last 168 hours.  No results found for: LABCA2  No results found for: IAX655  No results found for: VZS827  No results found for: MBE675  No results found for: CA2729  No components found for: HGQUANT  No results found for: CEA1 / No results found for: CEA1   No results found for: AFPTUMOR  No results found for: CHROMOGRNA  No results found for: PSA1  No visits with results within 3 Day(s) from this visit.  Latest known visit with results is:  Appointment on 07/19/2017  Component Date Value Ref Range Status  . Research Labs 07/19/2017 COLLECTED BY  LABORATORY   Final   Performed at [facility], [address], [place], [place] [address]    (this displays the last labs from the last 3 days)  No results found for: TOTALPROTELP, ALBUMINELP, A1GS, A2GS, BETS, BETA2SER, GAMS, MSPIKE, SPEI (this displays SPEP labs)  No results found for: KPAFRELGTCHN, LAMBDASER, KAPLAMBRATIO (kappa/lambda light chains)  No results found for: HGBA, HGBA2QUANT, HGBFQUANT, HGBSQUAN (Hemoglobinopathy evaluation)   No results found for: LDH  No results found for: IRON, TIBC, IRONPCTSAT (Iron and TIBC)  No results found for: FERRITIN  Urinalysis No results found for: COLORURINE, APPEARANCEUR, LABSPEC, PHURINE, GLUCOSEU, HGBUR, BILIRUBINUR, KETONESUR, PROTEINUR, UROBILINOGEN, NITRITE, LEUKOCYTESUR   STUDIES: Mm Clip Placement Right  Result Date: 07/09/2017 CLINICAL DATA:  Evaluate clip placement following stereotactic guided biopsy of anterior LOWER OUTER RIGHT breast calcifications. Recently diagnosed DCIS and invasive mammary carcinoma within the posterior RIGHT breast. EXAM: DIAGNOSTIC RIGHT MAMMOGRAM POST STEREOTACTIC BIOPSY COMPARISON:  Previous exam(s). FINDINGS: Mammographic images were obtained following stereotactic guided biopsy of calcifications within the anterior LOWER OUTER RIGHT breast. The COIL shaped clip is in satisfactory position within the anterior LOWER OUTER RIGHT breast. The COIL clip placed today lies 10 cm anterior to the previously placed posterior RIGHT breast COIL and X shaped clips demarcating biopsy-proven DCIS and invasive mammary carcinoma. IMPRESSION: Satisfactory COIL clip placement within the anterior LOWER OUTER RIGHT breast following stereotactic guided RIGHT breast biopsy. Final Assessment: Post Procedure Mammograms for Marker Placement Electronically Signed   By: Margarette Canada M.D.   On: 07/09/2017 11:12   Mm Rt Breast Bx W Loc Dev 1st Lesion Image Bx Spec Stereo Guide  Addendum Date: 07/12/2017     ADDENDUM REPORT: 07/12/2017 15:10 ADDENDUM: Pathology revealed INTERMEDIATE GRADE DUCTAL CARCINOMA IN SITU WITH CALCIFICATIONS of the RIGHT breast, lower outer. This was found to be concordant by Dr. Hassan Rowan. Pathology results were discussed with the patient by telephone. The patient reported doing well after the biopsy with tenderness at the site. Post biopsy instructions and care were reviewed and questions were answered. The patient was encouraged to call [facility] for any additional concerns. The patient has a recent diagnosis of right breast cancer and should follow her outlined treatment plan. Dr. Autumn Messing and Dr. Tressa Busman were notified of pathology results via EPIC message on July 12, 2017. Pathology results reported by Terie Purser, RN on 07/12/2017. Electronically Signed   By: Margarette Canada M.D.   On: 07/12/2017 15:10  Result Date: 07/12/2017 CLINICAL DATA:  76 year Lauren female for tissue sampling of calcifications within the anterior LOWER OUTER RIGHT breast for evaluation of possible disease extent. Recent diagnosis of DCIS and invasive mammary carcinoma within the posterior RIGHT breast. EXAM: RIGHT BREAST STEREOTACTIC CORE NEEDLE BIOPSY COMPARISON:  Previous exams. FINDINGS: The patient and I discussed the procedure of stereotactic-guided biopsy including benefits and alternatives. We discussed the high likelihood of a successful procedure. We discussed the risks of the procedure including infection, bleeding, tissue injury, clip migration, and inadequate sampling. Informed written consent was given. The usual time out protocol was performed immediately prior to the procedure. Using sterile technique and 1% Lidocaine as local anesthetic, under stereotactic guidance, a 9 gauge vacuum assisted device was used to perform core needle biopsy of calcifications within the anterior aspect of the LOWER OUTER RIGHT breast using a SUPERIOR approach. Specimen radiograph was  performed showing calcifications. Specimens with calcifications are identified for pathology. Lesion quadrant: LOWER OUTER RIGHT breast At the conclusion of the procedure, a COIL shaped tissue marker clip was deployed into the biopsy cavity. Follow-up 2-view mammogram was performed and dictated separately. IMPRESSION: Stereotactic-guided biopsy of calcifications within the anterior LOWER OUTER RIGHT breast. No apparent complications. Electronically Signed: By: Margarette Canada M.D. On: 07/09/2017 11:18    ELIGIBLE FOR AVAILABLE RESEARCH PROTOCOL: exact sciences study  ASSESSMENT: 77 y.o. [place], [place] woman status post right breast biopsy 07/01/2017 for a clinical mT2 N0, stage IB invasive ductal carcinoma, grade 1, estrogen and progesterone receptor positive, HER-2 nonamplified, with an MIB-15%  (1) a second area in the right breast biopsied 07/01/2017 showed ductal carcinoma in situ, grade 3, estrogen and progesterone receptor positive.  (2) definitive surgery pending 09/24/2017  (3) Oncotype DX to be obtained from the final surgical sample: Chemotherapy not anticipated  (4) adjuvant radiation as appropriate  (5) antiestrogens at the completion of local treatment  (6) genetics testing pending  PLAN:  Yannet is doing well today.  We reviewed the fact that her surgery is not until 09/2017.  Due to this fact, we will need to start anti estrogen therapy.  I recommended that she start on Anastrozole daily.  I reviewed the purpose of the Anastrozole and gave her detailed information about this in her AVS.  I reviewed the risks of Anastrozole daily, particularly bone loss.  She has had a DEXA and cannot remember the results.  I gave her a handout in her AVS regarding bone health and recommendations regarding calcium, vitamin d intake, along with weight bearing exercises that can help improve bone health.  She verbalizes understanding of this.  She does still have some questions about the surgical piece of  her treatment plan.  She will reach out to Dr. Ethlyn Gallery office to review these questions.    Naketa knows to call for any questions or concerns prior to her next appointment with [place].  This plan was reviewed with Dr. Jana Hakim in detail.    A total of (30) minutes of face-to-face time was spent with this patient with greater than 50% of that time in counseling and care-coordination.   Wilber Bihari, NP  08/04/17 1:19 PM Medical Oncology and Hematology [facility] [address] [place],  [address] Tel. [phone]    Fax. [phone]

## 2017-08-04 NOTE — Patient Instructions (Signed)
Anastrozole tablets What is this medicine? ANASTROZOLE (an AS troe zole) is used to treat breast cancer in women who have gone through menopause. Some types of breast cancer depend on estrogen to grow, and this medicine can stop tumor growth by blocking estrogen production. This medicine may be used for other purposes; ask your health care provider or pharmacist if you have questions. COMMON BRAND NAME(S): Arimidex What should I tell my health care provider before I take this medicine? They need to know if you have any of these conditions: -liver disease -an unusual or allergic reaction to anastrozole, other medicines, foods, dyes, or preservatives -pregnant or trying to get pregnant -breast-feeding How should I use this medicine? Take this medicine by mouth with a glass of water. Follow the directions on the prescription label. You can take this medicine with or without food. Take your doses at regular intervals. Do not take your medicine more often than directed. Do not stop taking except on the advice of your doctor or health care professional. Talk to your pediatrician regarding the use of this medicine in children. Special care may be needed. Overdosage: If you think you have taken too much of this medicine contact a poison control center or emergency room at once. NOTE: This medicine is only for you. Do not share this medicine with others. What if I miss a dose? If you miss a dose, take it as soon as you can. If it is almost time for your next dose, take only that dose. Do not take double or extra doses. What may interact with this medicine? Do not take this medicine with any of the following medications: -female hormones, like estrogens or progestins and birth control pills This medicine may also interact with the following medications: -tamoxifen This list may not describe all possible interactions. Give your health care provider a list of all the medicines, herbs, non-prescription  drugs, or dietary supplements you use. Also tell them if you smoke, drink alcohol, or use illegal drugs. Some items may interact with your medicine. What should I watch for while using this medicine? Visit your doctor or health care professional for regular checks on your progress. Let your doctor or health care professional know about any unusual vaginal bleeding. Do not treat yourself for diarrhea, nausea, vomiting or other side effects. Ask your doctor or health care professional for advice. What side effects may I notice from receiving this medicine? Side effects that you should report to your doctor or health care professional as soon as possible: -allergic reactions like skin rash, itching or hives, swelling of the face, lips, or tongue -any new or unusual symptoms -breathing problems -chest pain -leg pain or swelling -vomiting Side effects that usually do not require medical attention (report to your doctor or health care professional if they continue or are bothersome): -back or bone pain -cough, or throat infection -diarrhea or constipation -dizziness -headache -hot flashes -loss of appetite -nausea -sweating -weakness and tiredness -weight gain This list may not describe all possible side effects. Call your doctor for medical advice about side effects. You may report side effects to FDA at 1-800-FDA-1088. Where should I keep my medicine? Keep out of the reach of children. Store at room temperature between 20 and 25 degrees C (68 and 77 degrees F). Throw away any unused medicine after the expiration date. NOTE: This sheet is a summary. It may not cover all possible information. If you have questions about this medicine, talk to your doctor, pharmacist,   or health care provider.  2018 Elsevier/Gold Standard (2007-06-10 16:31:52) Bone Health Bones protect organs, store calcium, and anchor muscles. Good health habits, such as eating nutritious foods and exercising regularly, are  important for maintaining healthy bones. They can also help to prevent a condition that causes bones to lose density and become weak and brittle (osteoporosis). Why is bone mass important? Bone mass refers to the amount of bone tissue that you have. The higher your bone mass, the stronger your bones. An important step toward having healthy bones throughout life is to have strong and dense bones during childhood. A young adult who has a high bone mass is more likely to have a high bone mass later in life. Bone mass at its greatest it is called peak bone mass. A large decline in bone mass occurs in older adults. In women, it occurs about the time of menopause. During this time, it is important to practice good health habits, because if more bone is lost than what is replaced, the bones will become less healthy and more likely to break (fracture). If you find that you have a low bone mass, you may be able to prevent osteoporosis or further bone loss by changing your diet and lifestyle. How can I find out if my bone mass is low? Bone mass can be measured with an X-ray test that is called a bone mineral density (BMD) test. This test is recommended for all women who are age 89 or older. It may also be recommended for men who are age 80 or older, or for people who are more likely to develop osteoporosis due to:  Having bones that break easily.  Having a long-term disease that weakens bones, such as kidney disease or rheumatoid arthritis.  Having menopause earlier than normal.  Taking medicine that weakens bones, such as steroids, thyroid hormones, or hormone treatment for breast cancer or prostate cancer.  Smoking.  Drinking three or more alcoholic drinks each day.  What are the nutritional recommendations for healthy bones? To have healthy bones, you need to get enough of the right minerals and vitamins. Most nutrition experts recommend getting these nutrients from the foods that you eat. Nutritional  recommendations vary from person to person. Ask your health care provider what is healthy for you. Here are some general guidelines. Calcium Recommendations Calcium is the most important (essential) mineral for bone health. Most people can get enough calcium from their diet, but supplements may be recommended for people who are at risk for osteoporosis. Good sources of calcium include:  Dairy products, such as low-fat or nonfat milk, cheese, and yogurt.  Dark green leafy vegetables, such as bok choy and broccoli.  Calcium-fortified foods, such as orange juice, cereal, bread, soy beverages, and tofu products.  Nuts, such as almonds.  Follow these recommended amounts for daily calcium intake:  Children, age 619?3: 700 mg.  Children, age 61?8: 1,000 mg.  Children, age 64?13: 1,300 mg.  Teens, age 31?18: 1,300 mg.  Adults, age 8?50: 1,000 mg.  Adults, age 88?70: ? Men: 1,000 mg. ? Women: 1,200 mg.  Adults, age 21 or older: 1,200 mg.  Pregnant and breastfeeding females: ? Teens: 1,300 mg. ? Adults: 1,000 mg.  Vitamin D Recommendations Vitamin D is the most essential vitamin for bone health. It helps the body to absorb calcium. Sunlight stimulates the skin to make vitamin D, so be sure to get enough sunlight. If you live in a cold climate or you do not get outside  often, your health care provider may recommend that you take vitamin D supplements. Good sources of vitamin D in your diet include:  Egg yolks.  Saltwater fish.  Milk and cereal fortified with vitamin D.  Follow these recommended amounts for daily vitamin D intake:  Children and teens, age 84?18: 28 international units.  Adults, age 58 or younger: 400-800 international units.  Adults, age 59 or older: 800-1,000 international units.  Other Nutrients Other nutrients for bone health include:  Phosphorus. This mineral is found in meat, poultry, dairy foods, nuts, and legumes. The recommended daily intake for adult  men and adult women is 700 mg.  Magnesium. This mineral is found in seeds, nuts, dark green vegetables, and legumes. The recommended daily intake for adult men is 400?420 mg. For adult women, it is 310?320 mg.  Vitamin K. This vitamin is found in green leafy vegetables. The recommended daily intake is 120 mg for adult men and 90 mg for adult women.  What type of physical activity is best for building and maintaining healthy bones? Weight-bearing and strength-building activities are important for building and maintaining peak bone mass. Weight-bearing activities cause muscles and bones to work against gravity. Strength-building activities increases muscle strength that supports bones. Weight-bearing and muscle-building activities include:  Walking and hiking.  Jogging and running.  Dancing.  Gym exercises.  Lifting weights.  Tennis and racquetball.  Climbing stairs.  Aerobics.  Adults should get at least 30 minutes of moderate physical activity on most days. Children should get at least 60 minutes of moderate physical activity on most days. Ask your health care provide what type of exercise is best for you. Where can I find more information? For more information, check out the following websites:  [place]: YardHomes.se  [employer] of Health: http://www.niams.AnonymousEar.fr.asp  This information is not intended to replace advice given to you by your health care provider. Make sure you discuss any questions you have with your health care provider. Document Released: 06/20/2003 Document Revised: 10/18/2015 Document Reviewed: 04/04/2014 Elsevier Interactive Patient Education  [employer].

## 2017-08-05 ENCOUNTER — Telehealth: Payer: Self-pay | Admitting: Oncology

## 2017-08-05 NOTE — Telephone Encounter (Signed)
Called patient regarding 7/2 °

## 2017-08-25 ENCOUNTER — Ambulatory Visit: Payer: Medicare HMO | Admitting: Adult Health

## 2017-09-08 DIAGNOSIS — C50311 Malignant neoplasm of lower-inner quadrant of right female breast: Secondary | ICD-10-CM | POA: Diagnosis not present

## 2017-09-08 DIAGNOSIS — Z170 Estrogen receptor positive status [ER+]: Secondary | ICD-10-CM | POA: Diagnosis not present

## 2017-09-08 NOTE — H&P (Signed)
Subjective:     Patient ID: Lauren Holt is a 76 y.o. female.  HPI  Here for follow up discussion breast reconstruction. Presented following screening MMG withcalcificationsrightbreast. Diagnostic MMG showed calcifications within the inferior right breast, spanning 10 cm, more superior component spanning 4 cm extent. The more superior component may have a small associated mass.US axilla normal. Two biopsies initially done- first labeled right breastlower central, more lateral superior: IDC, ER/PR +, Her2-. In the lower central, more medial inferior: DCIS with calcifications, ER/PR+. Third biopsy labeled right lower outer with DCIS ER/PR+.  Given span of disease mastectomy recommended.  Plan Oncotype on surgical specimen.  Genetics negative.  Current 42 D, desires smaller. Wt stable.  Lives with adult son who works nights. 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  PMH significant for OSA.      Objective:   Physical Exam  Constitutional: She is oriented to person, place, and time.  Cardiovascular: Normal rate, regular rhythm and normal heart sounds.   Pulmonary/Chest: Effort normal and breath sounds normal.  Lymphadenopathy:    She has no axillary adenopathy.  Neurological: She is alert and oriented to person, place, and time.   Grade 2 ptosis bilateral SN to nipple R 30 L 30 cm BW R 20 L 20 cm (CW  Nipple to IMF R 11 L 12 cm No palpable masses    Assessment:     Right breast cancer LIQ ER+ DCIS    Plan:      Plan right mastectomy with immediate expander, ADM reconstruction. Given breast size and ptosis recommend skin reduction pattern.  Dicussed anchor type scar.   Reviewed post mastectomy reconstruction will be asensate and not stimulate. Reviewed with risks mastectomy flap necrosis requiring additional surgery. Reviewed process expansion, visits, MRI surveillance silicone implants.  Discussed use of acellular dermis in  reconstruction, cadaveric source, incorporation over several weeks, risk that if has seroma or infection can act as additional nidus for infection if not incorporated.  Discussed prepectoral vs sub pectoral reconstruction. Discussed with patient and benefit of this is no animation deformity, may be less pain. Risk may be more visible rippling over upper poles, greater need of ADM. Reviewed pre pectoral would require larger amount acellular dermis, more drains. Discussed any type reconstruction also risks long term displacement implant and visible rippling. If prepectoral counseled I would recommend she be comfortable with silicone implants as more options that have less rippling. She agrees to prepectoral placement.  Reviewed reconstruction will be asensate and not stimulate. Reviewed additional risks including but not limited to risks mastectomy flap necrosis requiring additional surgery, seroma, hematoma, asymmetry, need to additional procedures, fat necrosis, DVT/PE, damage to adjacent structures, cardiopulmonary complications.  Rx for [employer] given  Instructed to hold anastrazole for week prior to surgery. Using CBD oil including topically, also asked her to hold week prior.   , MD MBA Plastic & Reconstructive Surgery [phone], pin [phone]   

## 2017-09-13 ENCOUNTER — Ambulatory Visit: Payer: Medicare HMO | Admitting: Oncology

## 2017-09-15 ENCOUNTER — Inpatient Hospital Stay (HOSPITAL_COMMUNITY): Admission: RE | Admit: 2017-09-15 | Payer: Medicare HMO | Source: Ambulatory Visit

## 2017-09-20 NOTE — Pre-Procedure Instructions (Signed)
Lauren Holt  09/20/2017      [facility] [address], [place] [address] [place] [address] Phone: [phone] Fax: [phone]    Your procedure is scheduled on June 14  Report to [facility] Admitting at 0900 A.M.  Call this number if you have problems the morning of surgery:  [phone]   Remember:  NOTHING TO EAT OR DRINK AFTER MIDNIGHT EXCEPT DRINK THE PRE-SURGERY ENSURE BY 0800 AM THE MORNING OF SURGERY     Take these medicines the morning of surgery with A SIP OF WATER  ALPRAZolam (XANAX)  anastrozole (ARIMIDEX)  7 days prior to surgery STOP taking any Aspirin(unless otherwise instructed by your surgeon), Aleve, Naproxen, Ibuprofen, Motrin, Advil, Goody's, BC's, all herbal medications, fish oil, and all vitamins  Follow your doctors instructions regarding your Aspirin.  If no instructions were given by your doctor, then you will need to call the prescribing office office to get instructions.       Do not wear jewelry, make-up or nail polish.  Do not wear lotions, powders, or perfumes, or deodorant.  Do not shave 48 hours prior to surgery.    Do not bring valuables to the hospital.  [facility] is not responsible for any belongings or valuables.  Contacts, dentures or bridgework may not be worn into surgery.  Leave your suitcase in the car.  After surgery it may be brought to your room.  For patients admitted to the hospital, discharge time will be determined by your treatment team.  Patients discharged the day of surgery will not be allowed to drive home.    Special instructions:   [place]- Preparing For Surgery  Before surgery, you can play an important role. Because skin is not sterile, your skin needs to be as free of germs as possible. You can reduce the number of germs on your skin by washing with CHG (chlorahexidine gluconate) Soap before surgery.  CHG is an antiseptic cleaner which  kills germs and bonds with the skin to continue killing germs even after washing.    Oral Hygiene is also important to reduce your risk of infection.  Remember - BRUSH YOUR TEETH THE MORNING OF SURGERY WITH YOUR REGULAR TOOTHPASTE  Please do not use if you have an allergy to CHG or antibacterial soaps. If your skin becomes reddened/irritated stop using the CHG.  Do not shave (including legs and underarms) for at least 48 hours prior to first CHG shower. It is OK to shave your face.  Please follow these instructions carefully.   1. Shower the NIGHT BEFORE SURGERY and the MORNING OF SURGERY with CHG.   2. If you chose to wash your hair, wash your hair first as usual with your normal shampoo.  3. After you shampoo, rinse your hair and body thoroughly to remove the shampoo.  4. Use CHG as you would any other liquid soap. You can apply CHG directly to the skin and wash gently with a scrungie or a clean washcloth.   5. Apply the CHG Soap to your body ONLY FROM THE NECK DOWN.  Do not use on open wounds or open sores. Avoid contact with your eyes, ears, mouth and genitals (private parts). Wash Face and genitals (private parts)  with your normal soap.  6. Wash thoroughly, paying special attention to the area where your surgery will be performed.  7. Thoroughly rinse your body with warm water from the neck down.  8. DO NOT shower/wash with your normal soap after using and rinsing off the CHG Soap.  9. Pat yourself dry with a CLEAN TOWEL.  10. Wear CLEAN PAJAMAS to bed the night before surgery, wear comfortable clothes the morning of surgery  11. Place CLEAN SHEETS on your bed the night of your first shower and DO NOT SLEEP WITH PETS.    Day of Surgery:  Do not apply any deodorants/lotions.  Please wear clean clothes to the hospital/surgery center.   Remember to brush your teeth WITH YOUR REGULAR TOOTHPASTE.    Please read over the following fact sheets that you were  given.

## 2017-09-21 ENCOUNTER — Encounter (HOSPITAL_COMMUNITY)
Admission: RE | Admit: 2017-09-21 | Discharge: 2017-09-21 | Disposition: A | Discharge status: Home / Self Care | Payer: Medicare HMO | Source: Ambulatory Visit | Attending: General Surgery | Admitting: General Surgery

## 2017-09-21 ENCOUNTER — Other Ambulatory Visit: Payer: Self-pay

## 2017-09-21 ENCOUNTER — Encounter (HOSPITAL_COMMUNITY): Payer: Self-pay

## 2017-09-21 DIAGNOSIS — Z79899 Other long term (current) drug therapy: Secondary | ICD-10-CM | POA: Diagnosis not present

## 2017-09-21 DIAGNOSIS — Z87891 Personal history of nicotine dependence: Secondary | ICD-10-CM | POA: Diagnosis not present

## 2017-09-21 DIAGNOSIS — Z6830 Body mass index (BMI) 30.0-30.9, adult: Secondary | ICD-10-CM | POA: Diagnosis not present

## 2017-09-21 DIAGNOSIS — Z01812 Encounter for preprocedural laboratory examination: Secondary | ICD-10-CM

## 2017-09-21 DIAGNOSIS — R002 Palpitations: Secondary | ICD-10-CM

## 2017-09-21 DIAGNOSIS — C50311 Malignant neoplasm of lower-inner quadrant of right female breast: Secondary | ICD-10-CM | POA: Diagnosis not present

## 2017-09-21 DIAGNOSIS — F419 Anxiety disorder, unspecified: Secondary | ICD-10-CM | POA: Diagnosis not present

## 2017-09-21 DIAGNOSIS — M1990 Unspecified osteoarthritis, unspecified site: Secondary | ICD-10-CM | POA: Diagnosis not present

## 2017-09-21 DIAGNOSIS — N62 Hypertrophy of breast: Secondary | ICD-10-CM | POA: Diagnosis not present

## 2017-09-21 DIAGNOSIS — N6481 Ptosis of breast: Secondary | ICD-10-CM | POA: Diagnosis not present

## 2017-09-21 DIAGNOSIS — Z01810 Encounter for preprocedural cardiovascular examination: Secondary | ICD-10-CM | POA: Insufficient documentation

## 2017-09-21 DIAGNOSIS — Z7982 Long term (current) use of aspirin: Secondary | ICD-10-CM | POA: Diagnosis not present

## 2017-09-21 DIAGNOSIS — F329 Major depressive disorder, single episode, unspecified: Secondary | ICD-10-CM | POA: Diagnosis not present

## 2017-09-21 DIAGNOSIS — Z170 Estrogen receptor positive status [ER+]: Secondary | ICD-10-CM | POA: Diagnosis not present

## 2017-09-21 HISTORY — DX: Palpitations: R00.2

## 2017-09-21 LAB — CBC
HEMATOCRIT: 41.2 % (ref 36.0–46.0)
Hemoglobin: 13.3 g/dL (ref 12.0–15.0)
MCH: 30.9 pg (ref 26.0–34.0)
MCHC: 32.3 g/dL (ref 30.0–36.0)
MCV: 95.8 fL (ref 78.0–100.0)
Platelets: 276 10*3/uL (ref 150–400)
RBC: 4.3 MIL/uL (ref 3.87–5.11)
RDW: 12.7 % (ref 11.5–15.5)
WBC: 4.9 10*3/uL (ref 4.0–10.5)

## 2017-09-21 LAB — BASIC METABOLIC PANEL
Anion gap: 9 (ref 5–15)
BUN: 11 mg/dL (ref 6–20)
CALCIUM: 9.5 mg/dL (ref 8.9–10.3)
CO2: 29 mmol/L (ref 22–32)
Chloride: 101 mmol/L (ref 101–111)
Creatinine, Ser: 0.75 mg/dL (ref 0.44–1.00)
GFR calc Af Amer: >60 mL/min (ref >60)
GLUCOSE: 93 mg/dL (ref 65–99)
POTASSIUM: 4.2 mmol/L (ref 3.5–5.1)
Sodium: 139 mmol/L (ref 135–145)

## 2017-09-21 NOTE — Progress Notes (Addendum)
PCP - l. Donnie Coffin Cardiologist - denies  Chest x-ray - not needed EKG - 09/21/17 Stress Test - > 10 years ECHO - denies Cardiac Cath - denies   Aspirin Instructions: stopped  7 days prior  Anesthesia review: Normal EKG  Patient denies shortness of breath, fever, cough and chest pain at PAT appointment   Patient verbalized understanding of instructions that were given to them at the PAT appointment. Patient was also instructed that they will need to review over the PAT instructions again at home before surgery.

## 2017-09-22 NOTE — Progress Notes (Signed)
Anesthesia Chart Review:  Case:  [id number] Date/Time:  09/24/17 1045   Procedures:      MASTECTOMY WITH SENTINEL LYMPH NODE BIOPSY (Right Breast)     BREAST RECONSTRUCTION WITH PLACEMENT OF TISSUE EXPANDER AND ALLODERM (Right )   Anesthesia type:  General   Pre-op diagnosis:  right breast cancer   Location:  [facility] OR ROOM 02 / [facility]   Surgeon:  Jovita Kussmaul, MD; Irene Limbo, MD      DISCUSSION: Patient is a 77 year old female scheduled for the above procedure. History includes former smoker, anxiety, right breast cancer (invasive ductal carcinoma, DCIS by biopsy 06/29/17). She reported brief intermittent palpitations/fluttering in her chest when lying down. Reportedly, symptoms only last for a few seconds. No syncope, chest pain, SOB. EKG at PAT showed NSR.  She reported difficult to wake up after tubal ligation 50 years ago.   She stopped ASA 7 days prior to surgery. If no acute changes then I anticipate that she can proceed as planned.  VS: BP 123/77   Pulse 85   Temp 36.5 C   Resp 18   Ht 5' 3.5" (1.613 m)   Wt 174 lb 8 oz (79.2 kg)   SpO2 96%   BMI 30.43 kg/m   PROVIDERS: Mitchell, L.Marlou Sa, MD is PCP Patient Care Team: Jovita Kussmaul, MD as Consulting Physician (General Surgery) Magrinat, Virgie Dad, MD as Consulting Physician (Oncology) Kyung Rudd, MD as Consulting Physician (Radiation Oncology)   LABS: Labs reviewed: Acceptable for surgery. (all labs ordered are listed, but only abnormal results are displayed)  Labs Reviewed  BASIC METABOLIC PANEL  CBC    EKG: 09/21/17: NSR. (EKG done due to reports of intermittent palpitations.)   CV: She reported a stress test > 10 years ago and that she was told symptoms were due to anxiety.  Past Medical History:  Diagnosis Date  . Anxiety   . Complication of anesthesia    difficult to wake - many years ago with tubal 50 years ago  . Dermatitis   . Family history of breast cancer 07/12/2017  . Family history of prostate  cancer 07/12/2017  . Genetic testing   . Palpitations    when she lays down, has not seen anyone for this, feels like a flutter    Past Surgical History:  Procedure Laterality Date  . ABDOMINAL HYSTERECTOMY    . COLONOSCOPY      MEDICATIONS: . ALPRAZolam (XANAX) 0.25 MG tablet  . anastrozole (ARIMIDEX) 1 MG tablet  . aspirin 325 MG tablet  . Calcium Carb-Cholecalciferol (CALCIUM 600 + D PO)  . Cholecalciferol (VITAMIN D-3) 5000 units TABS  . Lutein 20 MG CAPS  . Magnesium Citrate 100 MG TABS  . Multiple Vitamins-Minerals (ICAPS AREDS FORMULA PO)  . Probiotic Product (PROBIOTIC DAILY PO)  . Red Yeast Rice 600 MG CAPS  . zinc gluconate 50 MG tablet   No current facility-administered medications for this encounter.    George Hugh [facility] Short Stay Center/Anesthesiology Phone [phone] 09/22/2017 12:03 PM

## 2017-09-24 ENCOUNTER — Encounter (HOSPITAL_COMMUNITY)
Admission: RE | Admit: 2017-09-24 | Discharge: 2017-09-24 | Disposition: A | Discharge status: Home / Self Care | Payer: Medicare HMO | Source: Ambulatory Visit | Attending: General Surgery | Admitting: General Surgery

## 2017-09-24 ENCOUNTER — Other Ambulatory Visit: Payer: Self-pay

## 2017-09-24 ENCOUNTER — Encounter (HOSPITAL_COMMUNITY)
Admission: RE | Disposition: A | Discharge status: Home / Self Care | Payer: Self-pay | Source: Ambulatory Visit | Attending: General Surgery

## 2017-09-24 ENCOUNTER — Ambulatory Visit (HOSPITAL_COMMUNITY)
Admission: RE | Admit: 2017-09-24 | Discharge: 2017-09-25 | Disposition: A | Discharge status: Home / Self Care | Payer: Medicare HMO | Source: Ambulatory Visit | Attending: General Surgery | Admitting: General Surgery

## 2017-09-24 ENCOUNTER — Ambulatory Visit (HOSPITAL_COMMUNITY): Payer: Medicare HMO | Admitting: Anesthesiology

## 2017-09-24 ENCOUNTER — Ambulatory Visit (HOSPITAL_COMMUNITY): Payer: Medicare HMO | Admitting: Vascular Surgery

## 2017-09-24 ENCOUNTER — Encounter (HOSPITAL_COMMUNITY): Payer: Self-pay | Admitting: Surgery

## 2017-09-24 DIAGNOSIS — F419 Anxiety disorder, unspecified: Secondary | ICD-10-CM | POA: Diagnosis not present

## 2017-09-24 DIAGNOSIS — N6481 Ptosis of breast: Secondary | ICD-10-CM | POA: Insufficient documentation

## 2017-09-24 DIAGNOSIS — C50311 Malignant neoplasm of lower-inner quadrant of right female breast: Secondary | ICD-10-CM | POA: Insufficient documentation

## 2017-09-24 DIAGNOSIS — M1990 Unspecified osteoarthritis, unspecified site: Secondary | ICD-10-CM | POA: Diagnosis not present

## 2017-09-24 DIAGNOSIS — N62 Hypertrophy of breast: Secondary | ICD-10-CM | POA: Diagnosis not present

## 2017-09-24 DIAGNOSIS — F329 Major depressive disorder, single episode, unspecified: Secondary | ICD-10-CM | POA: Insufficient documentation

## 2017-09-24 DIAGNOSIS — C50911 Malignant neoplasm of unspecified site of right female breast: Secondary | ICD-10-CM | POA: Diagnosis present

## 2017-09-24 DIAGNOSIS — Z170 Estrogen receptor positive status [ER+]: Secondary | ICD-10-CM | POA: Insufficient documentation

## 2017-09-24 DIAGNOSIS — Z87891 Personal history of nicotine dependence: Secondary | ICD-10-CM | POA: Insufficient documentation

## 2017-09-24 DIAGNOSIS — Z6830 Body mass index (BMI) 30.0-30.9, adult: Secondary | ICD-10-CM | POA: Insufficient documentation

## 2017-09-24 DIAGNOSIS — Z79899 Other long term (current) drug therapy: Secondary | ICD-10-CM | POA: Insufficient documentation

## 2017-09-24 DIAGNOSIS — G8918 Other acute postprocedural pain: Secondary | ICD-10-CM | POA: Diagnosis not present

## 2017-09-24 DIAGNOSIS — Z7982 Long term (current) use of aspirin: Secondary | ICD-10-CM | POA: Insufficient documentation

## 2017-09-24 HISTORY — PX: BREAST RECONSTRUCTION WITH PLACEMENT OF TISSUE EXPANDER AND ALLODERM: SHX6805

## 2017-09-24 HISTORY — DX: Malignant neoplasm of unspecified site of right female breast: C50.911

## 2017-09-24 HISTORY — PX: MASTECTOMY COMPLETE / SIMPLE W/ SENTINEL NODE BIOPSY: SUR846

## 2017-09-24 HISTORY — DX: Family history of other specified conditions: Z84.89

## 2017-09-24 HISTORY — PX: MASTECTOMY W/ SENTINEL NODE BIOPSY: SHX2001

## 2017-09-24 SURGERY — MASTECTOMY WITH SENTINEL LYMPH NODE BIOPSY
Anesthesia: Regional | Site: Breast | Laterality: Right

## 2017-09-24 MED ORDER — METHOCARBAMOL 500 MG PO TABS
ORAL_TABLET | ORAL | Status: AC
  Filled 2017-09-24: qty 1

## 2017-09-24 MED ORDER — PROPOFOL 500 MG/50ML IV EMUL
INTRAVENOUS | Status: DC | PRN
  Administered 2017-09-24: 100 ug/kg/min via INTRAVENOUS
  Administered 2017-09-24: 12:00:00 via INTRAVENOUS

## 2017-09-24 MED ORDER — FENTANYL CITRATE (PF) 250 MCG/5ML IJ SOLN
INTRAMUSCULAR | Status: AC
  Filled 2017-09-24: qty 5

## 2017-09-24 MED ORDER — ACETAMINOPHEN 500 MG PO TABS
1000.0000 mg | ORAL_TABLET | ORAL | Status: AC
  Administered 2017-09-24: 1000 mg via ORAL
  Filled 2017-09-24: qty 2

## 2017-09-24 MED ORDER — TECHNETIUM TC 99M SULFUR COLLOID FILTERED
1.0000 | Freq: Once | INTRAVENOUS | Status: AC | PRN
  Administered 2017-09-24: 1 via INTRADERMAL

## 2017-09-24 MED ORDER — MIDAZOLAM HCL 2 MG/2ML IJ SOLN
INTRAMUSCULAR | Status: AC
  Administered 2017-09-24: 1 mg via INTRAVENOUS
  Filled 2017-09-24: qty 2

## 2017-09-24 MED ORDER — SODIUM CHLORIDE 0.9 % IV SOLN
Freq: Once | INTRAVENOUS | Status: AC
  Administered 2017-09-24: 13:00:00
  Filled 2017-09-24: qty 1

## 2017-09-24 MED ORDER — PROPOFOL 10 MG/ML IV BOLUS
INTRAVENOUS | Status: DC | PRN
  Administered 2017-09-24: 110 mg via INTRAVENOUS

## 2017-09-24 MED ORDER — CELECOXIB 200 MG PO CAPS
200.0000 mg | ORAL_CAPSULE | ORAL | Status: AC
  Administered 2017-09-24: 200 mg via ORAL
  Filled 2017-09-24: qty 1

## 2017-09-24 MED ORDER — KETOROLAC TROMETHAMINE 15 MG/ML IJ SOLN
15.0000 mg | Freq: Three times a day (TID) | INTRAMUSCULAR | Status: AC
  Administered 2017-09-24 – 2017-09-25 (×3): 15 mg via INTRAVENOUS
  Filled 2017-09-24 (×3): qty 1

## 2017-09-24 MED ORDER — BUPIVACAINE-EPINEPHRINE (PF) 0.5% -1:200000 IJ SOLN
INTRAMUSCULAR | Status: DC | PRN
  Administered 2017-09-24: 30 mL via PERINEURAL

## 2017-09-24 MED ORDER — MORPHINE SULFATE (PF) 2 MG/ML IV SOLN
1.0000 mg | INTRAVENOUS | Status: DC | PRN
  Administered 2017-09-24: 4 mg via INTRAVENOUS
  Filled 2017-09-24: qty 2

## 2017-09-24 MED ORDER — PHENYLEPHRINE 40 MCG/ML (10ML) SYRINGE FOR IV PUSH (FOR BLOOD PRESSURE SUPPORT)
PREFILLED_SYRINGE | INTRAVENOUS | Status: AC
  Filled 2017-09-24: qty 10

## 2017-09-24 MED ORDER — OXYCODONE HCL 5 MG/5ML PO SOLN: 5.0000 mg | Freq: Once | ORAL | Status: AC | PRN

## 2017-09-24 MED ORDER — DEXAMETHASONE SODIUM PHOSPHATE 10 MG/ML IJ SOLN
INTRAMUSCULAR | Status: DC | PRN
  Administered 2017-09-24: 5 mg via INTRAVENOUS

## 2017-09-24 MED ORDER — SUGAMMADEX SODIUM 200 MG/2ML IV SOLN
INTRAVENOUS | Status: DC | PRN
  Administered 2017-09-24: 157 mg via INTRAVENOUS

## 2017-09-24 MED ORDER — KETAMINE HCL 10 MG/ML IJ SOLN
INTRAMUSCULAR | Status: AC
  Filled 2017-09-24: qty 1

## 2017-09-24 MED ORDER — LACTATED RINGERS IV SOLN
INTRAVENOUS | Status: DC
  Administered 2017-09-24: 10:00:00 via INTRAVENOUS

## 2017-09-24 MED ORDER — 0.9 % SODIUM CHLORIDE (POUR BTL) OPTIME
TOPICAL | Status: DC | PRN
  Administered 2017-09-24: 1000 mL

## 2017-09-24 MED ORDER — OXYCODONE HCL 5 MG PO TABS
5.0000 mg | ORAL_TABLET | Freq: Once | ORAL | Status: AC | PRN
  Administered 2017-09-24: 5 mg via ORAL

## 2017-09-24 MED ORDER — KCL IN DEXTROSE-NACL 20-5-0.9 MEQ/L-%-% IV SOLN
INTRAVENOUS | Status: DC
  Administered 2017-09-24: 17:00:00 via INTRAVENOUS
  Filled 2017-09-24: qty 1000

## 2017-09-24 MED ORDER — KETAMINE HCL 10 MG/ML IJ SOLN
INTRAMUSCULAR | Status: DC | PRN
  Administered 2017-09-24: 10 mg via INTRAVENOUS
  Administered 2017-09-24: 30 mg via INTRAVENOUS

## 2017-09-24 MED ORDER — ROCURONIUM BROMIDE 10 MG/ML (PF) SYRINGE
PREFILLED_SYRINGE | INTRAVENOUS | Status: DC | PRN
Start: 2017-09-24 — End: 2017-09-24
  Administered 2017-09-24: 60 mg via INTRAVENOUS
  Administered 2017-09-24 (×2): 20 mg via INTRAVENOUS

## 2017-09-24 MED ORDER — ONDANSETRON HCL 4 MG/2ML IJ SOLN
INTRAMUSCULAR | Status: AC
  Filled 2017-09-24: qty 2

## 2017-09-24 MED ORDER — ALPRAZOLAM 0.25 MG PO TABS: 0.1250 mg | ORAL_TABLET | Freq: Two times a day (BID) | ORAL | Status: DC | PRN

## 2017-09-24 MED ORDER — FENTANYL CITRATE (PF) 250 MCG/5ML IJ SOLN
INTRAMUSCULAR | Status: DC | PRN
  Administered 2017-09-24 (×3): 50 ug via INTRAVENOUS
  Administered 2017-09-24: 100 ug via INTRAVENOUS

## 2017-09-24 MED ORDER — LIDOCAINE 2% (20 MG/ML) 5 ML SYRINGE
INTRAMUSCULAR | Status: AC
  Filled 2017-09-24: qty 5

## 2017-09-24 MED ORDER — CEFAZOLIN SODIUM-DEXTROSE 2-4 GM/100ML-% IV SOLN
2.0000 g | INTRAVENOUS | Status: AC
  Administered 2017-09-24: 2 g via INTRAVENOUS
  Filled 2017-09-24: qty 100

## 2017-09-24 MED ORDER — FENTANYL CITRATE (PF) 100 MCG/2ML IJ SOLN
50.0000 ug | Freq: Once | INTRAMUSCULAR | Status: AC
  Administered 2017-09-24: 50 ug via INTRAVENOUS

## 2017-09-24 MED ORDER — METHYLENE BLUE 0.5 % INJ SOLN
INTRAVENOUS | Status: AC
  Filled 2017-09-24: qty 10

## 2017-09-24 MED ORDER — ROCURONIUM BROMIDE 10 MG/ML (PF) SYRINGE
PREFILLED_SYRINGE | INTRAVENOUS | Status: AC
  Filled 2017-09-24: qty 5

## 2017-09-24 MED ORDER — MIDAZOLAM HCL 2 MG/2ML IJ SOLN
1.0000 mg | Freq: Once | INTRAMUSCULAR | Status: AC
  Administered 2017-09-24: 1 mg via INTRAVENOUS

## 2017-09-24 MED ORDER — FAMOTIDINE IN NACL 20-0.9 MG/50ML-% IV SOLN
20.0000 mg | Freq: Two times a day (BID) | INTRAVENOUS | Status: DC
  Administered 2017-09-24: 20 mg via INTRAVENOUS
  Filled 2017-09-24: qty 50

## 2017-09-24 MED ORDER — HYDROCODONE-ACETAMINOPHEN 5-325 MG PO TABS
1.0000 | ORAL_TABLET | ORAL | Status: DC | PRN
  Administered 2017-09-24: 2 via ORAL
  Administered 2017-09-25 (×2): 1 via ORAL
  Filled 2017-09-24: qty 1
  Filled 2017-09-24: qty 2
  Filled 2017-09-24: qty 1
  Filled 2017-09-24: qty 2

## 2017-09-24 MED ORDER — ONDANSETRON HCL 4 MG/2ML IJ SOLN
INTRAMUSCULAR | Status: DC | PRN
  Administered 2017-09-24: 4 mg via INTRAVENOUS

## 2017-09-24 MED ORDER — GABAPENTIN 300 MG PO CAPS
300.0000 mg | ORAL_CAPSULE | ORAL | Status: AC
  Administered 2017-09-24: 300 mg via ORAL
  Filled 2017-09-24 (×2): qty 1

## 2017-09-24 MED ORDER — HEPARIN SODIUM (PORCINE) 5000 UNIT/ML IJ SOLN
5000.0000 [IU] | Freq: Three times a day (TID) | INTRAMUSCULAR | Status: DC
  Administered 2017-09-25: 5000 [IU] via SUBCUTANEOUS
  Filled 2017-09-24: qty 1

## 2017-09-24 MED ORDER — ANASTROZOLE 1 MG PO TABS
1.0000 mg | ORAL_TABLET | Freq: Every day | ORAL | Status: DC
  Filled 2017-09-24: qty 1

## 2017-09-24 MED ORDER — FENTANYL CITRATE (PF) 100 MCG/2ML IJ SOLN: 25.0000 ug | INTRAMUSCULAR | Status: DC | PRN

## 2017-09-24 MED ORDER — SUGAMMADEX SODIUM 200 MG/2ML IV SOLN
INTRAVENOUS | Status: AC
  Filled 2017-09-24: qty 2

## 2017-09-24 MED ORDER — OXYCODONE HCL 5 MG PO TABS
ORAL_TABLET | ORAL | Status: AC
  Filled 2017-09-24: qty 1

## 2017-09-24 MED ORDER — DEXAMETHASONE SODIUM PHOSPHATE 10 MG/ML IJ SOLN
INTRAMUSCULAR | Status: AC
  Filled 2017-09-24: qty 1

## 2017-09-24 MED ORDER — FENTANYL CITRATE (PF) 100 MCG/2ML IJ SOLN
INTRAMUSCULAR | Status: AC
  Administered 2017-09-24: 50 ug via INTRAVENOUS
  Filled 2017-09-24: qty 2

## 2017-09-24 MED ORDER — CHLORHEXIDINE GLUCONATE CLOTH 2 % EX PADS: 6.0000 | MEDICATED_PAD | Freq: Once | CUTANEOUS | Status: DC

## 2017-09-24 MED ORDER — METHOCARBAMOL 500 MG PO TABS
500.0000 mg | ORAL_TABLET | Freq: Four times a day (QID) | ORAL | Status: DC | PRN
  Administered 2017-09-24 – 2017-09-25 (×3): 500 mg via ORAL
  Filled 2017-09-24 (×2): qty 1

## 2017-09-24 MED ORDER — MIDAZOLAM HCL 2 MG/2ML IJ SOLN
INTRAMUSCULAR | Status: AC
  Filled 2017-09-24: qty 2

## 2017-09-24 MED ORDER — ONDANSETRON 4 MG PO TBDP: 4.0000 mg | ORAL_TABLET | Freq: Four times a day (QID) | ORAL | Status: DC | PRN

## 2017-09-24 MED ORDER — ONDANSETRON HCL 4 MG/2ML IJ SOLN: 4.0000 mg | Freq: Four times a day (QID) | INTRAMUSCULAR | Status: DC | PRN

## 2017-09-24 MED ORDER — LIDOCAINE 2% (20 MG/ML) 5 ML SYRINGE
INTRAMUSCULAR | Status: DC | PRN
  Administered 2017-09-24: 60 mg via INTRAVENOUS

## 2017-09-24 MED ORDER — PHENYLEPHRINE 40 MCG/ML (10ML) SYRINGE FOR IV PUSH (FOR BLOOD PRESSURE SUPPORT)
PREFILLED_SYRINGE | INTRAVENOUS | Status: DC | PRN
  Administered 2017-09-24: 80 ug via INTRAVENOUS

## 2017-09-24 SURGICAL SUPPLY — 87 items
ADH SKN CLS APL DERMABOND .7 (GAUZE/BANDAGES/DRESSINGS) ×1
ALLODERM 16X20 PERFORATED (Tissue) ×1 IMPLANT
ALLOGRAFT PERF 16X20 1.6+/-0.4 (Tissue) ×1 IMPLANT
APPLIER CLIP 9.375 MED OPEN (MISCELLANEOUS) ×3
APR CLP MED 9.3 20 MLT OPN (MISCELLANEOUS) ×1
BAG DECANTER FOR FLEXI CONT (MISCELLANEOUS) ×3 IMPLANT
BINDER BREAST LRG (GAUZE/BANDAGES/DRESSINGS) IMPLANT
BINDER BREAST XLRG (GAUZE/BANDAGES/DRESSINGS) IMPLANT
BINDER BREAST XXLRG (GAUZE/BANDAGES/DRESSINGS) ×2 IMPLANT
BIOPATCH RED 1 DISK 7.0 (GAUZE/BANDAGES/DRESSINGS) ×2 IMPLANT
BIOPATCH RED 1IN DISK 7.0MM (GAUZE/BANDAGES/DRESSINGS) ×1
CANISTER SUCT 3000ML PPV (MISCELLANEOUS) ×6 IMPLANT
CHLORAPREP W/TINT 26ML (MISCELLANEOUS) ×6 IMPLANT
CLIP APPLIE 9.375 MED OPEN (MISCELLANEOUS) ×1 IMPLANT
CONT SPEC 4OZ CLIKSEAL STRL BL (MISCELLANEOUS) ×3 IMPLANT
COVER PROBE W GEL 5X96 (DRAPES) ×3 IMPLANT
COVER SURGICAL LIGHT HANDLE (MISCELLANEOUS) ×6 IMPLANT
DERMABOND ADVANCED (GAUZE/BANDAGES/DRESSINGS) ×2
DERMABOND ADVANCED .7 DNX12 (GAUZE/BANDAGES/DRESSINGS) ×3 IMPLANT
DEVICE DISSECT PLASMABLAD 3.0S (MISCELLANEOUS) IMPLANT
DRAIN CHANNEL 15F RND FF W/TCR (WOUND CARE) ×2 IMPLANT
DRAIN CHANNEL 19F RND (DRAIN) ×5 IMPLANT
DRAPE CHEST BREAST 15X10 FENES (DRAPES) ×3 IMPLANT
DRAPE HALF SHEET 40X57 (DRAPES) IMPLANT
DRAPE ORTHO SPLIT 77X108 STRL (DRAPES) ×6
DRAPE SURG ORHT 6 SPLT 77X108 (DRAPES) ×2 IMPLANT
DRAPE WARM FLUID 44X44 (DRAPE) ×3 IMPLANT
DRSG PAD ABDOMINAL 8X10 ST (GAUZE/BANDAGES/DRESSINGS) ×9 IMPLANT
DRSG TEGADERM 4X4.75 (GAUZE/BANDAGES/DRESSINGS) ×7 IMPLANT
ELECT BLADE 4.0 EZ CLEAN MEGAD (MISCELLANEOUS) ×3
ELECT CAUTERY BLADE 6.4 (BLADE) ×6 IMPLANT
ELECT COATED BLADE 2.86 ST (ELECTRODE) ×3 IMPLANT
ELECT REM PT RETURN 9FT ADLT (ELECTROSURGICAL) ×6
ELECTRODE BLDE 4.0 EZ CLN MEGD (MISCELLANEOUS) ×1 IMPLANT
ELECTRODE REM PT RTRN 9FT ADLT (ELECTROSURGICAL) ×2 IMPLANT
EVACUATOR SILICONE 100CC (DRAIN) ×3 IMPLANT
EXPANDER BREAST TISSUE 500CC (Breast) ×2 IMPLANT
GLOVE BIO SURGEON STRL SZ 6 (GLOVE) ×9 IMPLANT
GLOVE BIO SURGEON STRL SZ7.5 (GLOVE) ×3 IMPLANT
GOWN STRL REUS W/ TWL LRG LVL3 (GOWN DISPOSABLE) ×4 IMPLANT
GOWN STRL REUS W/TWL LRG LVL3 (GOWN DISPOSABLE) ×12
KIT BASIN OR (CUSTOM PROCEDURE TRAY) ×6 IMPLANT
KIT TURNOVER KIT B (KITS) ×6 IMPLANT
LIGHT WAVEGUIDE WIDE FLAT (MISCELLANEOUS) ×2 IMPLANT
MARKER SKIN DUAL TIP RULER LAB (MISCELLANEOUS) ×3 IMPLANT
NDL 18GX1X1/2 (RX/OR ONLY) (NEEDLE) IMPLANT
NDL FILTER BLUNT 18X1 1/2 (NEEDLE) IMPLANT
NDL HYPO 25GX1X1/2 BEV (NEEDLE) IMPLANT
NEEDLE 18GX1X1/2 (RX/OR ONLY) (NEEDLE) IMPLANT
NEEDLE FILTER BLUNT 18X 1/2SAF (NEEDLE)
NEEDLE FILTER BLUNT 18X1 1/2 (NEEDLE) IMPLANT
NEEDLE HYPO 25GX1X1/2 BEV (NEEDLE) IMPLANT
NS IRRIG 1000ML POUR BTL (IV SOLUTION) ×9 IMPLANT
PACK GENERAL/GYN (CUSTOM PROCEDURE TRAY) ×6 IMPLANT
PAD ARMBOARD 7.5X6 YLW CONV (MISCELLANEOUS) ×6 IMPLANT
PIN SAFETY STERILE (MISCELLANEOUS) ×3 IMPLANT
PLASMABLADE 3.0S (MISCELLANEOUS)
PUNCH BIOPSY 4MM (MISCELLANEOUS)
PUNCH BIOPSY DERMAL 6MM STRL (MISCELLANEOUS) IMPLANT
PUNCH BIOPSY DISP 4 (MISCELLANEOUS) IMPLANT
SET ASEPTIC TRANSFER (MISCELLANEOUS) ×3 IMPLANT
SOL PREP POV-IOD 4OZ 10% (MISCELLANEOUS) ×3 IMPLANT
SPECIMEN JAR X LARGE (MISCELLANEOUS) ×3 IMPLANT
STAPLER VISISTAT 35W (STAPLE) ×3 IMPLANT
SUT CHROMIC 4 0 PS 2 18 (SUTURE) IMPLANT
SUT ETHILON 2 0 FS 18 (SUTURE) ×6 IMPLANT
SUT ETHILON 3 0 FSL (SUTURE) ×3 IMPLANT
SUT MNCRL 3 0 RB1 (SUTURE) IMPLANT
SUT MNCRL AB 4-0 PS2 18 (SUTURE) ×3 IMPLANT
SUT MONOCRYL 3 0 RB1 (SUTURE)
SUT VIC AB 0 CT1 27 (SUTURE)
SUT VIC AB 0 CT1 27XBRD ANBCTR (SUTURE) IMPLANT
SUT VIC AB 3-0 54X BRD REEL (SUTURE) IMPLANT
SUT VIC AB 3-0 BRD 54 (SUTURE)
SUT VIC AB 3-0 SH 18 (SUTURE) ×3 IMPLANT
SUT VIC AB 3-0 SH 27 (SUTURE)
SUT VIC AB 3-0 SH 27X BRD (SUTURE) IMPLANT
SUT VICRYL 3 0 (SUTURE) IMPLANT
SUT VICRYL 4-0 PS2 18IN ABS (SUTURE) IMPLANT
SUT VLOC 180 0 24IN GS25 (SUTURE) IMPLANT
SYR BULB IRRIGATION 50ML (SYRINGE) ×3 IMPLANT
SYR CONTROL 10ML LL (SYRINGE) IMPLANT
TOWEL OR 17X24 6PK STRL BLUE (TOWEL DISPOSABLE) ×6 IMPLANT
TOWEL OR 17X26 10 PK STRL BLUE (TOWEL DISPOSABLE) ×6 IMPLANT
TRAY FOLEY MTR SLVR 16FR STAT (SET/KITS/TRAYS/PACK) IMPLANT
TUBE CONNECTING 12'X1/4 (SUCTIONS) ×1
TUBE CONNECTING 12X1/4 (SUCTIONS) ×2 IMPLANT

## 2017-09-24 NOTE — Anesthesia Postprocedure Evaluation (Signed)
Anesthesia Post Note  Patient: Lauren Holt  Procedure(s) Performed: MASTECTOMY WITH SENTINEL LYMPH NODE BIOPSY (Right Breast) BREAST RECONSTRUCTION WITH PLACEMENT OF TISSUE EXPANDER AND ALLODERM (Right Breast)     Patient location during evaluation: PACU Anesthesia Type: Regional and General Level of consciousness: awake and sedated Pain management: pain level controlled Vital Signs Assessment: post-procedure vital signs reviewed and stable Respiratory status: spontaneous breathing, nonlabored ventilation, respiratory function stable and patient connected to nasal cannula oxygen Cardiovascular status: blood pressure returned to baseline and stable Postop Assessment: no apparent nausea or vomiting Anesthetic complications: no    Last Vitals:  Vitals:   09/24/17 1431 09/24/17 1438  BP: 115/63 128/61  Pulse: 65 65  Resp: 13 12  Temp:    SpO2: 95% 95%    Last Pain:  Vitals:   09/24/17 1430  TempSrc:   PainSc: Asleep                 Gamal Todisco,JAMES TERRILL

## 2017-09-24 NOTE — Progress Notes (Signed)
Taught daughter how to empty and strip JP drains

## 2017-09-24 NOTE — Interval H&P Note (Signed)
History and Physical Interval Note:  09/24/2017 9:12 AM  Lauren Holt  has presented today for surgery, with the diagnosis of right breast cancer  The various methods of treatment have been discussed with the patient and family. After consideration of risks, benefits and other options for treatment, the patient has consented to  Procedure(s): MASTECTOMY WITH SENTINEL LYMPH NODE BIOPSY (Right) BREAST RECONSTRUCTION WITH PLACEMENT OF TISSUE EXPANDER AND ALLODERM (Right) as a surgical intervention .  The patient's history has been reviewed, patient examined, no change in status, stable for surgery.  I have reviewed the patient's chart and labs.  Questions were answered to the patient's satisfaction.     Berdene Askari

## 2017-09-24 NOTE — H&P (Addendum)
Lauren Holt  Location: [facility] Surgery Patient #: [id number] DOB: [birth year] Divorced / Language: Lauren Holt / Race: White Female   History of Present Illness  The patient is a 77 year old female who presents with breast cancer. We are asked to see the patient in consultation by Dr. Lisbeth Renshaw to evaluate her for a new right breast cancer. The patient is a 77 year old white female who presents with a screen detected mass(2) and calcs in the medial inferior right breast. The area of calcs spanned 10cm. The 2 masses measured 2.6cm and 73m. they were biopsied and came back as grade 1 invasive ductal cancer and dcis. It was ER and PR + and Her2 - with a Ki67 of 5%. The calcs have not been biopsied.   Problem List/Past Medical MASTODYNIA OF RIGHT BREAST (N64.4)  MALIGNANT NEOPLASM OF LOWER-INNER QUADRANT OF RIGHT BREAST OF FEMALE, ESTROGEN RECEPTOR POSITIVE (C50.311)   Past Surgical History Colon Polyp Removal - Colonoscopy  Colon Polyp Removal - Open  Hysterectomy (not due to cancer) - Partial   Diagnostic Studies History  Mammogram  within last year  Allergies  No Known Drug Allergies   Medication History  Medications Reconciled PARoxetine HCl ('20MG'$  Tablet, Oral) Active. Citalopram Hydrobromide ('10MG'$  Tablet, Oral) Active. Aspirin ('325MG'$  Tablet DR, Oral) Active. Fish Oil ('500MG'$  Capsule, Oral) Active. Multiple Vitamin (1 (one) Oral) Active. Red Yeast Rice ('600MG'$  Tablet, Oral) Active. Vitamin C ('500MG'$  Capsule, Oral) Active. Vitamin D (1000UNIT Tablet, Oral) Active. Vitamin E (400UNIT Capsule, Oral) Active.  Social History Alcohol use  Occasional alcohol use. Caffeine use  Coffee. No drug use  Tobacco use  Never smoker.  Family History Arthritis  Brother, Mother, Sister. Colon Polyps  Father. Depression  Sister. Diabetes Mellitus  Mother, STora Duck SPandora Leiter Heart Disease  Father, Mother. Hypertension  Brother, Sister. Kidney Disease   Brother.  Pregnancy / Birth History Age at menarche  89years. Age of menopause  51-55 Contraceptive History  Oral contraceptives. Gravida  3 Maternal age  75-30Para  3 Regular periods   Other Problems  Anxiety Disorder  Arthritis  Bladder Problems  Lump In Breast     Review of Systems  General Not Present- Appetite Loss, Chills, Fatigue, Fever, Night Sweats, Weight Gain and Weight Loss. Note: All other systems negative (unless as noted in HPI & included Review of Systems) Skin Not Present- Change in Wart/Mole, Dryness, Hives, Jaundice, New Lesions, Non-Healing Wounds, Rash and Ulcer. HEENT Not Present- Earache, Hearing Loss, Hoarseness, Nose Bleed, Oral Ulcers, Ringing in the Ears, Seasonal Allergies, Sinus Pain, Sore Throat, Visual Disturbances, Wears glasses/contact lenses and Yellow Eyes. Respiratory Not Present- Bloody sputum, Chronic Cough, Difficulty Breathing, Snoring and Wheezing. Breast Not Present- Breast Mass, Breast Pain, Nipple Discharge and Skin Changes. Cardiovascular Not Present- Chest Pain, Difficulty Breathing Lying Down, Leg Cramps, Palpitations, Rapid Heart Rate, Shortness of Breath and Swelling of Extremities. Gastrointestinal Not Present- Abdominal Pain, Bloating, Bloody Stool, Change in Bowel Habits, Chronic diarrhea, Constipation, Difficulty Swallowing, Excessive gas, Gets full quickly at meals, Hemorrhoids, Indigestion, Nausea, Rectal Pain and Vomiting. Female Genitourinary Not Present- Frequency, Nocturia, Painful Urination, Pelvic Pain and Urgency. Musculoskeletal Not Present- Back Pain, Joint Pain, Joint Stiffness, Muscle Pain, Muscle Weakness and Swelling of Extremities. Neurological Not Present- Decreased Memory, Fainting, Headaches, Numbness, Seizures, Tingling, Tremor, Trouble walking and Weakness. Psychiatric Not Present- Anxiety, Bipolar, Change in Sleep Pattern, Depression, Fearful and Frequent crying. Endocrine Not Present- Cold  Intolerance, Excessive Hunger, Hair Changes, Heat Intolerance, Hot flashes and  New Diabetes. Hematology Not Present- Easy Bruising, Excessive bleeding, Gland problems, HIV and Persistent Infections.   Physical Exam  General Mental Status-Alert. General Appearance-Consistent with stated age. Hydration-Well hydrated. Voice-Normal.  Head and Neck Head-normocephalic, atraumatic with no lesions or palpable masses. Trachea-midline. Thyroid Gland Characteristics - normal size and consistency.  Eye Eyeball - Bilateral-Extraocular movements intact. Sclera/Conjunctiva - Bilateral-No scleral icterus.  Chest and Lung Exam Chest and lung exam reveals -quiet, even and easy respiratory effort with no use of accessory muscles and on auscultation, normal breath sounds, no adventitious sounds and normal vocal resonance. Inspection Chest Wall - Normal. Back - normal.  Breast Note: there is no palpable mass in either breast. there is no palpable axillary, supraclavicular, or cervical lymphadenopathy   Cardiovascular Cardiovascular examination reveals -normal heart sounds, regular rate and rhythm with no murmurs and normal pedal pulses bilaterally.  Abdomen Inspection Inspection of the abdomen reveals - No Hernias. Skin - Scar - no surgical scars. Palpation/Percussion Palpation and Percussion of the abdomen reveal - Soft, Non Tender, No Rebound tenderness, No Rigidity (guarding) and No hepatosplenomegaly. Auscultation Auscultation of the abdomen reveals - Bowel sounds normal.  Neurologic Neurologic evaluation reveals -alert and oriented x 3 with no impairment of recent or remote memory. Mental Status-Normal.  Musculoskeletal Normal Exam - Left-Upper Extremity Strength Normal and Lower Extremity Strength Normal. Normal Exam - Right-Upper Extremity Strength Normal and Lower Extremity Strength Normal.  Lymphatic Head & Neck  General Head & Neck Lymphatics:  Bilateral - Description - Normal. Axillary  General Axillary Region: Bilateral - Description - Normal. Tenderness - Non Tender. Femoral & Inguinal  Generalized Femoral & Inguinal Lymphatics: Bilateral - Description - Normal. Tenderness - Non Tender.    Assessment & Plan  MALIGNANT NEOPLASM OF LOWER-INNER QUADRANT OF RIGHT BREAST OF FEMALE, ESTROGEN RECEPTOR POSITIVE (C50.311) Impression: The patient has an area of invasive ductal cancer in the lower inner right breast of moderate size. I have talked to her about the different options for treatment. At this point the calcs need to be biopsied. if they are positive for cancer then I don't think breast conservation will be an option. If it is negative then she may be a candidate for lumpectomy but will be at higher risk of having positive margins. Biopsy will be friday and we will make a decision once this information is back. She will also be a candidate for sentinel node mapping. I have discussed with her the risks and benefits of the surgery as well as some of the technical aspects and she understands. i will call her with next path  She has elected for right mastectomy and sentinel node mapping with reconstruction

## 2017-09-24 NOTE — Anesthesia Preprocedure Evaluation (Addendum)
Anesthesia Evaluation  Patient identified by MRN, date of birth, ID band Patient awake    Reviewed: Allergy & Precautions, NPO status , Patient's Chart, lab work & pertinent test results  History of Anesthesia Complications Negative for: history of anesthetic complications  Airway Mallampati: III  TM Distance: <3 FB Neck ROM: Full    Dental  (+) Teeth Intact, Dental Advisory Given   Pulmonary neg shortness of breath, neg sleep apnea, neg recent URI, former smoker,    breath sounds clear to auscultation       Cardiovascular negative cardio ROS   Rhythm:Regular     Neuro/Psych  Headaches, PSYCHIATRIC DISORDERS Anxiety Depression  Neuromuscular disease    GI/Hepatic negative GI ROS, Neg liver ROS,   Endo/Other  Morbid obesity  Renal/GU negative Renal ROS     Musculoskeletal   Abdominal   Peds  Hematology negative hematology ROS (+)   Anesthesia Other Findings   Reproductive/Obstetrics                            Anesthesia Physical Anesthesia Plan  ASA: II  Anesthesia Plan: Regional and General   Post-op Pain Management:  Regional for Post-op pain   Induction:   PONV Risk Score and Plan: 3 and Ondansetron, Dexamethasone and Propofol infusion  Airway Management Planned: Oral ETT  Additional Equipment: None  Intra-op Plan:   Post-operative Plan: Extubation in OR  Informed Consent: I have reviewed the patients History and Physical, chart, labs and discussed the procedure including the risks, benefits and alternatives for the proposed anesthesia with the patient or authorized representative who has indicated his/her understanding and acceptance.   Dental advisory given  Plan Discussed with: CRNA and Surgeon  Anesthesia Plan Comments:         Anesthesia Quick Evaluation

## 2017-09-24 NOTE — Op Note (Signed)
Operative Note   DATE OF OPERATION: 6.14.19  LOCATION: [place] [facility] OR-observation  SURGICAL DIVISION: Plastic Surgery  PREOPERATIVE DIAGNOSES:  1. Right breast cancer LIQ ER+ 2. DCIS right breast  POSTOPERATIVE DIAGNOSES:  same  PROCEDURE:  1. Right breast reconstruction with tissue expander 2. Acellular dermis for breast reconstruction (Alloderm) 310 cm2  SURGEON: Irene Limbo MD MBA  ASSISTANT: S Rayburn PA-C  ANESTHESIA:  General.   EBL: 50 ml for entire procedure  COMPLICATIONS: None immediate.   INDICATIONS FOR PROCEDURE:  The patient, Lauren Holt, is a 77 y.o. female born on 1940/06/23, is here for immediate prepectoral expander acellular dermis breast reconstruction following skin reduction pattern mastectomy.   FINDINGS: Natrelle 133FV-13-T 500 ml tissue expander placed, initial fill volume 420 ml air. SN [id number]  DESCRIPTION OF PROCEDURE:  The patient was marked with the patient in the preoperative area to mark sternal notch, chest midline, anterior axillary lines and inframammary folds. Patient was marked for skin reduction mastectomy with most superior portion nipple areola marked on breast meridian. Vertical limbs marked by breast displacement and set at 9cm length. The patient was taken to the operating room. SCDs were placed and IV antibiotics were given. The patient's operative site was prepped and draped in a sterile fashion. A time out was performed and all information was confirmed to be correct. In supine position, the lateral limbs for resection marked and area over lower pole preserved as inferiorly based dermal pedicle. Skin de epithelialized in this area.I assisted in mastectomy and sentinel LN sampling with exposure and retraction.  Following completion of mastectomy, the cavity was irrigated with solution containing Ancef, gentamicin, and bacitracin. Hemostasis was ensured. A 19 Fr drain was placed in subcutaneous position laterally anda 15 Fr drain  placed along inframammary fold. Eachsecured to skin with 2-0 nylon. Cavity irrigated with Betadine.The tissue expanderwasprepared on back table prior in insertion. The expander was filled with air to 420 ml. Perforated acellular dermis was draped over anterior surface expander. The ADM was then secured to itself over posterior surface of expander. Redundant folds acellular dermis excised so that the ADM lie flat without folds over air filled expander.The expander was secured to medial insertion pectoralis with a 0 vicryl.The lateral tab also secured to pectoralis muscle with 0-vicryl. The ADM was secured to pectoralis muscle and chest wall along inferior border at inframammary foldwith 0 V lock suture.Laterally the mastectomy flap over posterior axillary line was advanced anteriorly and the subcutaneous tissue and superficial fascia was secured to pectoralis muscle and acellular dermis with 0-vicryl. The inferiorly based dermal pedicle was redraped superiorly over expander and acellular dermis and secured to pectoralis with interrupted 0-vicryl. Skin closure completedwith 3-0 vicryl in fascial layer and 4-0 vicryl in dermis. Skin closure completed with 4-0 monocryl subcuticular and tissue adhesive.  Tegaderms applied over skin flaps. Dry dressing and breast binder applied.The patient was allowed to wakefromanesthesia, extubatedand taken to the recovery room in satisfactory condition.   SPECIMENS: none  DRAINS: 15 and 19 Fr JP in right breast reconstruction  Irene Limbo, MD [facility] Plastic & Reconstructive Surgery [phone], pin [phone]

## 2017-09-24 NOTE — Op Note (Signed)
09/24/2017  12:34 PM  PATIENT:  Lauren Holt  77 y.o. female  PRE-OPERATIVE DIAGNOSIS:  right breast cancer  POST-OPERATIVE DIAGNOSIS:  right breast cancer  PROCEDURE:  Procedure(s): RIGHT MASTECTOMY WITH DEEP RIGHT AXILLARY SENTINEL LYMPH NODE BIOPSY (Right)  SURGEON:  Surgeon(s) and Role: Panel 1:    * Jovita Kussmaul, MD - Primary  PHYSICIAN ASSISTANT:   ASSISTANTS: Shawn Rayburn, PA   ANESTHESIA:   general  EBL:  minimal   BLOOD ADMINISTERED:none  DRAINS: none   LOCAL MEDICATIONS USED:  NONE  SPECIMEN:  Source of Specimen:  right mastectomy and sentinel nodes X 2  DISPOSITION OF SPECIMEN:  PATHOLOGY  COUNTS:  YES  TOURNIQUET:  * No tourniquets in log *  DICTATION: .Dragon Dictation   After informed consent was obtained the patient was brought to the operating room and placed in the supine position on the operating table.  After adequate induction of general anesthesia the patient's bilateral chest, breast, and axillary area were prepped with ChloraPrep, allowed to dry, and draped in usual sterile manner.  An appropriate timeout was performed.  Earlier in the day the patient underwent injection of 1 mCi of technetium sulfur colloid in the subareolar position on the right.  A triangular incision was made around the nipple and areole a complex on the right in the Wise pattern.  The incision was carried through the skin and subcutaneous tissue sharply with the plasma blade.  Breast hooks were then used to elevate the skin flaps anteriorly towards the saline.  Thin skin flaps were then created circumferentially between the breast tissue in the subcutaneous fat.  This dissection was carried all the way to the chest wall circumferentially.  Next the breast was removed from the pectoralis muscle with the pectoralis fascia.  Once this was accomplished the breast was able to be removed from the patient.  The breast was marked with a stitch on the lateral skin and sent to  pathology.  The neoprobe was set to technetium in an area of radioactivity in the right axilla was identified.  I was actually able to identify 2 hot lymph nodes.  These were excised sharply with the plasma blade.  Ex vivo counts on these 2 nodes ranged from 100 to 1000.  These were sent as sentinel nodes numbers 1 and 2.  No other hot or palpable lymph nodes were identified in the right axilla.  Hemostasis was achieved using the plasma blade.  The wound was irrigated with saline and packed with a moistened lap sponge.  At this point the operation was turned over to Dr. Iran Planas for the reconstruction portion of the case.  Her portion will be dictated separately.  At this point the patient had tolerated the procedure well.  All needle sponge and instrument counts were correct.  PLAN OF CARE: Admit for overnight observation  PATIENT DISPOSITION:  PACU - hemodynamically stable.   Delay start of Pharmacological VTE agent (>24hrs) due to surgical blood loss or risk of bleeding: no

## 2017-09-24 NOTE — Interval H&P Note (Signed)
History and Physical Interval Note:  09/24/2017 10:29 AM  Lauren Holt  has presented today for surgery, with the diagnosis of right breast cancer  The various methods of treatment have been discussed with the patient and family. After consideration of risks, benefits and other options for treatment, the patient has consented to  Procedure(s): MASTECTOMY WITH SENTINEL LYMPH NODE BIOPSY (Right) BREAST RECONSTRUCTION WITH PLACEMENT OF TISSUE EXPANDER AND ALLODERM (Right) as a surgical intervention .  The patient's history has been reviewed, patient examined, no change in status, stable for surgery.  I have reviewed the patient's chart and labs.  Questions were answered to the patient's satisfaction.     TOTH III,Eisa Necaise S

## 2017-09-24 NOTE — Transfer of Care (Signed)
Immediate Anesthesia Transfer of Care Note  Patient: Lauren Holt  Procedure(s) Performed: MASTECTOMY WITH SENTINEL LYMPH NODE BIOPSY (Right Breast) BREAST RECONSTRUCTION WITH PLACEMENT OF TISSUE EXPANDER AND ALLODERM (Right Breast)  Patient Location: PACU  Anesthesia Type:General and Regional  Level of Consciousness: awake, alert  and oriented  Airway & Oxygen Therapy: Patient Spontanous Breathing and Patient connected to face mask oxygen  Post-op Assessment: Report given to RN, Post -op Vital signs reviewed and stable and Patient moving all extremities X 4  Post vital signs: Reviewed and stable  Last Vitals:  Vitals Value Taken Time  BP    Temp    Pulse    Resp    SpO2      Last Pain:  Vitals:   09/24/17 0945  TempSrc:   PainSc: 0-No pain      Patients Stated Pain Goal: 3 (18/29/93 7169)  Complications: No apparent anesthesia complications

## 2017-09-24 NOTE — Anesthesia Procedure Notes (Addendum)
Procedure Name: Intubation Date/Time: 09/24/2017 11:03 AM Performed by: Harden Mo, CRNA Pre-anesthesia Checklist: Patient identified, Emergency Drugs available, Suction available, Patient being monitored and Timeout performed Patient Re-evaluated:Patient Re-evaluated prior to induction Oxygen Delivery Method: Circle system utilized Preoxygenation: Pre-oxygenation with 100% oxygen Induction Type: IV induction Ventilation: Mask ventilation without difficulty Laryngoscope Size: Miller and 2 Grade View: Grade I Tube type: Oral Tube size: 7.0 mm Number of attempts: 1 Airway Equipment and Method: Stylet Placement Confirmation: ETT inserted through vocal cords under direct vision,  positive ETCO2 and breath sounds checked- equal and bilateral Secured at: 21 cm Tube secured with: Tape Dental Injury: Teeth and Oropharynx as per pre-operative assessment  Comments: Intubation by Carver Fila, SRNA

## 2017-09-24 NOTE — Op Note (Signed)
First Assist Op Note: [facility] OR I assisted the Surgeon(s) ___Dr. Arnoldo Hooker Thimmappa______ on the procedure(s): __Immediate right breast reconstruction with tissue expander and alloderm_____on Date ___6/14/2019______  I provided my assistance on this case as follows:  I was present and acted as first Environmental consultant during this operation. I was present during the patient transport into the operative suite and assisted the OR staff with transferring and positioning of the patient. All extremities were checked and properly cushioned and safety straps in place. I was involved in the prepping and placement of sterile drapes. A time out was performed and all information confirmed to be correct.  I first assisted during the case including retraction for exposure, assisting with closure of surgical wounds and application of sterile dressings. I provided assistance with application of post operative garments/splinting and assisted with patient transfer back to the stretcher as needed.   RAYBURN,SHAWN,PA-C Plastic Surgery [phone]

## 2017-09-24 NOTE — Anesthesia Procedure Notes (Signed)
Anesthesia Regional Block: Pectoralis block   Pre-Anesthetic Checklist: ,, timeout performed, Correct Patient, Correct Site, Correct Laterality, Correct Procedure, Correct Position, site marked, Risks and benefits discussed,  Surgical consent,  Pre-op evaluation,  At surgeon's request and post-op pain management  Laterality: Right  Prep: chloraprep       Needles:  Injection technique: Single-shot  Needle Type: Echogenic Needle          Additional Needles:   Procedures:,,,, ultrasound used (permanent image in chart),,,,  Narrative:  Start time: 09/24/2017 10:35 AM End time: 09/24/2017 10:39 AM Injection made incrementally with aspirations every 5 mL.  Performed by: Personally   Additional Notes: H+P and labs reviewed, risks and benefits discussed with patient, procedure tolerated well without complications

## 2017-09-24 NOTE — Progress Notes (Signed)
Lauren Holt is a 77 y.o. female patient admitted. Awake, alert - oriented  X 4 - no acute distress noted.  VSS - Blood pressure 114/64, pulse 64, temperature (Abnormal) 97.5 F (36.4 C), temperature source Oral, resp. rate 14, height 5' 3.5" (1.613 m), weight 78.5 kg (173 lb), SpO2 94 %.    IV in place, occlusive dsg intact without redness.  Orientation to room, and floor completed.  Admission INP armband ID verified with patient/family, and in place.   SR up x 2, fall assessment complete, with patient and family able to verbalize understanding of risk associated with falls, and verbalized understanding to call nsg before up out of bed.  Call light within reach, patient able to voice, and demonstrate understanding. No evidence of skin break down noted on exam.  Admission nurse notified of admission.     Will cont to eval and treat per MD orders.  Theora Master, RN 09/24/2017 4:30 PM

## 2017-09-25 DIAGNOSIS — N6481 Ptosis of breast: Secondary | ICD-10-CM | POA: Diagnosis not present

## 2017-09-25 DIAGNOSIS — F419 Anxiety disorder, unspecified: Secondary | ICD-10-CM | POA: Diagnosis not present

## 2017-09-25 DIAGNOSIS — Z87891 Personal history of nicotine dependence: Secondary | ICD-10-CM | POA: Diagnosis not present

## 2017-09-25 DIAGNOSIS — M1990 Unspecified osteoarthritis, unspecified site: Secondary | ICD-10-CM | POA: Diagnosis not present

## 2017-09-25 DIAGNOSIS — C50311 Malignant neoplasm of lower-inner quadrant of right female breast: Secondary | ICD-10-CM | POA: Diagnosis not present

## 2017-09-25 DIAGNOSIS — Z170 Estrogen receptor positive status [ER+]: Secondary | ICD-10-CM | POA: Diagnosis not present

## 2017-09-25 DIAGNOSIS — N62 Hypertrophy of breast: Secondary | ICD-10-CM | POA: Diagnosis not present

## 2017-09-25 DIAGNOSIS — F329 Major depressive disorder, single episode, unspecified: Secondary | ICD-10-CM | POA: Diagnosis not present

## 2017-09-25 MED ORDER — FAMOTIDINE 20 MG PO TABS
20.0000 mg | ORAL_TABLET | Freq: Two times a day (BID) | ORAL | Status: DC
  Administered 2017-09-25: 20 mg via ORAL
  Filled 2017-09-25: qty 1

## 2017-09-25 MED ORDER — HYDROCODONE-ACETAMINOPHEN 5-325 MG PO TABS: 1.0000 | ORAL_TABLET | ORAL | 0 refills | Status: DC | PRN

## 2017-09-25 MED ORDER — METHOCARBAMOL 500 MG PO TABS: 500.0000 mg | ORAL_TABLET | Freq: Three times a day (TID) | ORAL | 0 refills | Status: DC | PRN

## 2017-09-25 MED ORDER — DOXYCYCLINE HYCLATE 50 MG PO CAPS: 50.0000 mg | ORAL_CAPSULE | Freq: Two times a day (BID) | ORAL | 0 refills | Status: DC

## 2017-09-25 NOTE — Progress Notes (Signed)
Discharge home. Home discharge instruction given, no question verbalized. 

## 2017-09-25 NOTE — Progress Notes (Signed)
Patient ID: Lauren Holt, female   DOB: 07/07/1940, 77 y.o.   MRN: [id number] 1 Day Post-Op   Subjective: Sore but no major complaints  Objective: Vital signs in last 24 hours: Temp:  [97.4 F (36.3 C)-98.3 F (36.8 C)] 98.3 F (36.8 C) (06/15 0900) Pulse Rate:  [58-67] 61 (06/15 0900) Resp:  [11-18] 15 (06/15 0900) BP: (101-132)/(49-70) 123/55 (06/15 0900) SpO2:  [93 %-100 %] 98 % (06/15 0900) Weight:  [78.9 kg (174 lb)] 78.9 kg (174 lb) (06/14 1540) Last BM Date: 09/23/17  Intake/Output from previous day: 06/14 0701 - 06/15 0700 In: 2290.8 [P.O.:850; I.V.:1367.5; IV Piggyback:73.3] Out: 196 [Drains:146; Blood:50] Intake/Output this shift: Total I/O In: -  Out: 25 [Drains:25]  General appearance: alert, cooperative and no distress  Dr. Iran Planas just examined chest wall and not repeated  Lab Results:  No results for input(s): WBC, HGB, HCT, PLT in the last 72 hours. BMET No results for input(s): NA, K, CL, CO2, GLUCOSE, BUN, CREATININE, CALCIUM in the last 72 hours.   Studies/Results: Nm Sentinel Node Inj-no Rpt (breast)  Result Date: 09/24/2017 Sulfur colloid was injected by the nuclear medicine technologist for melanoma sentinel node.    Anti-infectives: Anti-infectives (From admission, onward)   Start     Dose/Rate Route Frequency Ordered Stop   09/25/17 0000  doxycycline (VIBRAMYCIN) 50 MG capsule     50 mg Oral 2 times daily 09/25/17 1004     09/24/17 1030  bacitracin 50,000 Units, gentamicin (GARAMYCIN) 80 mg, ceFAZolin (ANCEF) 1 g in sodium chloride 0.9 % 1,000 mL      Irrigation Once 09/24/17 1022 09/24/17 1252   09/24/17 1000  ceFAZolin (ANCEF) IVPB 2g/100 mL premix     2 g 200 mL/hr over 30 Minutes Intravenous On call to O.R. 09/24/17 0812 09/24/17 1109      Assessment/Plan: s/p Procedure(s): MASTECTOMY WITH SENTINEL LYMPH NODE BIOPSY BREAST RECONSTRUCTION WITH PLACEMENT OF TISSUE EXPANDER AND ALLODERM Doing well postoperatively without  apparent complication and discharge planned today.   LOS: 0 days    Edward Jolly 09/25/2017

## 2017-09-25 NOTE — Discharge Summary (Signed)
Physician Discharge Summary  Patient ID: Lauren Holt MRN: [id number] DOB/AGE: 1941/03/07 77 y.o.  Admit date: 09/24/2017 Discharge date: 09/25/2017  Admission Diagnoses: Right breast cancer  Discharge Diagnoses:  Active Problems:   Cancer of right female breast [facility])   Discharged Condition: stable  Hospital Course: Postoperatively patient did well with controlled pain, tolerating diet and ambulatory in room. Instructed on drain and incision care.  Treatments: surgery: right mastectomy with SLN, right breast reconstruction with tissue expander acellular dermis 6.14.19  Discharge Exam: Blood pressure (!) 123/55, pulse 61, temperature 98.3 F (36.8 C), temperature source Oral, resp. rate 15, height 5\' 3"  (1.6 m), weight 78.9 kg (174 lb), SpO2 98 %. Incision/Wound: chest soft Tegaderms in place incisions without drainage intact JPs serosanguinous  Disposition: Discharge disposition: 01-Home or Self Care       Discharge Instructions    Call MD for:  redness, tenderness, or signs of infection (pain, swelling, bleeding, redness, odor or green/yellow discharge around incision site)   Complete by:  As directed    Call MD for:  temperature >100.5   Complete by:  As directed    Discharge instructions   Complete by:  As directed    Ok to remove dressings and shower am 6.16.19  Soap and water ok, pat Tegaderms dry. Do not remove Tegaderms. No creams or ointments over incisions. Do not let drains dangle in shower, attach to lanyard or similar.Strip and record drains twice daily and bring log to clinic visit.  Breast binder or soft compression bra all other times.  Ok to raise arms above shoulders for bathing and dressing.  No house yard work or exercise until cleared by MD.   Recommend Miralax or Dulcolax as needed for constipation. Recommend ibuprofen or naproxen as directed with meals to aid with pain control.   Driving Restrictions   Complete by:  As directed    No driving  for 2 weeks, then no driving if taking narcotics   Lifting restrictions   Complete by:  As directed    No lifting > 5 lbs until cleared by MD   Resume previous diet   Complete by:  As directed      Allergies as of 09/25/2017   No Known Allergies     Medication List    TAKE these medications   ALPRAZolam 0.25 MG tablet Commonly known as:  XANAX Take 0.125 mg by mouth 2 (two) times daily as needed for anxiety.   anastrozole 1 MG tablet Commonly known as:  ARIMIDEX Take 1 tablet (1 mg total) by mouth daily.   aspirin 325 MG tablet Take 325 mg by mouth daily.   CALCIUM 600 + D PO Take 1 tablet by mouth daily.   doxycycline 50 MG capsule Commonly known as:  VIBRAMYCIN Take 1 capsule (50 mg total) by mouth 2 (two) times daily.   HYDROcodone-acetaminophen 5-325 MG tablet Commonly known as:  NORCO/VICODIN Take 1-2 tablets by mouth every 4 (four) hours as needed for moderate pain.   ICAPS AREDS FORMULA PO Take 1 capsule by mouth daily.   Lutein 20 MG Caps Take 1 capsule by mouth daily.   Magnesium Citrate 100 MG Tabs Take 1 tablet by mouth daily.   methocarbamol 500 MG tablet Commonly known as:  ROBAXIN Take 1 tablet (500 mg total) by mouth every 8 (eight) hours as needed for muscle spasms.   PROBIOTIC DAILY PO Take 1 capsule by mouth every other day.   Red Yeast Rice 600 MG  Caps Take 1 capsule by mouth daily.   Vitamin D-3 5000 units Tabs Take 1 tablet by mouth daily.   zinc gluconate 50 MG tablet Take 50 mg by mouth daily as needed (cold prevention).      Follow-up Information    Irene Limbo, MD In 1 week.   Specialty:  Plastic Surgery Why:  as scheduled Contact information: [address] [place] [address] [phone]        Autumn Messing III, MD. Schedule an appointment as soon as possible for a visit in 2 weeks.   Specialty:  General Surgery Contact information: [address] [place]  [address] [phone]           Signed: Irene Limbo 09/25/2017, 10:04 AM

## 2017-09-27 ENCOUNTER — Encounter (HOSPITAL_COMMUNITY): Payer: Self-pay | Admitting: General Surgery

## 2017-09-30 ENCOUNTER — Telehealth: Payer: Self-pay | Admitting: *Deleted

## 2017-09-30 NOTE — Telephone Encounter (Signed)
Received order for oncotype testing. Requisition faxed to pathology. Received by Lynelle Smoke

## 2017-10-11 ENCOUNTER — Encounter (HOSPITAL_COMMUNITY): Payer: Self-pay | Admitting: Oncology

## 2017-10-11 DIAGNOSIS — Z170 Estrogen receptor positive status [ER+]: Secondary | ICD-10-CM | POA: Diagnosis not present

## 2017-10-11 DIAGNOSIS — C50311 Malignant neoplasm of lower-inner quadrant of right female breast: Secondary | ICD-10-CM | POA: Diagnosis not present

## 2017-10-11 NOTE — Progress Notes (Signed)
[place]  [phone] [phone]     ID: Lauren Holt DOB: [birth year]  MR#: [id number]  HKV#:[id number]  Patient Care Team: Alroy Dust, Carlean Jews.Marlou Sa, MD as PCP - General (Family Medicine) Jovita Kussmaul, MD as Consulting Physician (General Surgery) Magrinat, Virgie Dad, MD as Consulting Physician (Oncology) Kyung Rudd, MD as Consulting Physician (Radiation Oncology) OTHER MD:  CHIEF COMPLAINT: Estrogen receptor positive breast cancer  CURRENT TREATMENT: Adjuvant chemotherapy   HISTORY OF CURRENT ILLNESS: On the original intake note:  Lauren Holt had routine screening mammography on 06/14/2017 showing a possible area of calcifications in the right breast. She underwent unilateral right diagnostic mammography with tomography at [facility] on 06/23/2017 showing: Suspicious calcifications within the inferior right breast, spanning nearly 10 cm, more superior component spanning 4 cm extent. The more superior component may have a small associated mass. Ultrasonography of the right axilla on 07/01/2017 showed normal right axillary lymph nodes.   Accordingly on 06/29/2017 she proceeded to biopsy of the right breast area in question. The pathology from this procedure showed ([id number]): In the right breast lower central, more lateral superior: invasive ductal carcinoma grade I. In the lower central, more medial inferior: Ductal carcinoma in situ, high grade, with calcifications. The  invasive tumor was significant for estrogen receptor, 100% positive and progesterone receptor, 10% positive, both with strong staining intensity. Proliferation marker Ki67 at 5%. HER2  not amplified. The ductal carcinoma in situ was significant for estrogen receptor, 100% positive and progesterone receptor, 80% positive, both with strong staining intensity.   The patient's subsequent history is as detailed below.  INTERVAL HISTORY: Lauren Holt returns today for follow up and  treatment of her estrogen receptor positive breast cancer. She continues on anastrozole, with good tolerance. She has frequent hot flashes during the day and night. She denies issues with vaginal dryness.   Since her last visit, she underwent right mastectomy and sentinel lymph node sampling ([id number]) on 09/24/2017 with pathology showing: invasive ductal carcinoma grade I, spanning 1.2 cm. DCIS with calcifications intermediate grade - multiple foci. Atypical lobular hyperplasia. Resection margins are negative. Five right axillary sentinel lymph nodes were negative for carcinoma.   She also completed an oncotype which showed a score of 28, predicting a risk of outside the breast recurrence over the next 9 years of 17% if the patient's only systemic therapy is tamoxifen for 5 years.  It also predicts significant benefit from chemotherapy.   REVIEW OF SYSTEMS: Lauren Holt reports that she is still experiencing pain from her mastectomy, which she aids with er pain medication. She also has a drain, and this morning she cleared 8 cc of fluid. She denies any bleeding issues with surgery. She is still recovering, but she was able to drive today.  She denies unusual headaches, visual changes, nausea, vomiting, or dizziness. There has been no unusual cough, phlegm production, or pleurisy. This been no change in bowel or bladder habits. She denies unexplained fatigue or unexplained weight loss,rash, or fever. A detailed review of systems was otherwise stable.    PAST MEDICAL HISTORY: Past Medical History:  Diagnosis Date  . Anxiety   . Breast cancer, right breast ([place])   . Complication of anesthesia    difficult to wake - many years ago with tubal 50 years ago  . Dermatitis   . Family history of adverse reaction to anesthesia    "I think my sister was hard to wake up" (09/24/2017)  . Family history of breast cancer 07/12/2017  .  Family history of prostate cancer 07/12/2017  . Genetic testing   . Palpitations     when she lays down, has not seen anyone for this, feels like a flutter   HLD ( but not too high)   PAST SURGICAL HISTORY: Past Surgical History:  Procedure Laterality Date  . BREAST BIOPSY Right 06/2017  . BREAST RECONSTRUCTION WITH PLACEMENT OF TISSUE EXPANDER AND ALLODERM Right 09/24/2017  . BREAST RECONSTRUCTION WITH PLACEMENT OF TISSUE EXPANDER AND ALLODERM Right 09/24/2017   Procedure: BREAST RECONSTRUCTION WITH PLACEMENT OF TISSUE EXPANDER AND ALLODERM;  Surgeon: Irene Limbo, MD;  Location: [facility];  Service: Plastics;  Laterality: Right;  . CARPAL TUNNEL RELEASE Bilateral   . COLONOSCOPY    . MASTECTOMY COMPLETE / SIMPLE W/ SENTINEL NODE BIOPSY Right 09/24/2017  . MASTECTOMY W/ SENTINEL NODE BIOPSY Right 09/24/2017   Procedure: MASTECTOMY WITH SENTINEL LYMPH NODE BIOPSY;  Surgeon: Jovita Kussmaul, MD;  Location: [facility];  Service: General;  Laterality: Right;  . TUBAL LIGATION    . VAGINAL HYSTERECTOMY     Partial Hysterectomy without BSO. Because they were too close to the bladder.    FAMILY HISTORY Family History  Problem Relation Age of Onset  . Breast cancer Paternal Aunt   . Breast cancer Cousin 34  . Prostate cancer Father 13  . Breast cancer Cousin 77  . Breast cancer Cousin 25  . Breast cancer Cousin 63  . Breast cancer Other    The patient's father died at age 48 due to emphysema. The patient's mother is alive at age 17 as of March 2019. The patient has 5 brothers and 3 sisters. There was a paternal great aunt with breast cancer. There was also a paternal cousin with breast cancer.   GYNECOLOGIC HISTORY:  No LMP recorded. Patient has had a hysterectomy. Menarche: 77 years old Age at first live birth: 77 years old The patient is GXP3. She is s/p partial hysterectomy. Both of her ovaries remain. She used oral contraceptives with no complications. She also used HRT for 2 years without complications.     SOCIAL HISTORY:  Para is now retired. 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 She is divorced. Her oldest daughter, Lauren Holt", is a 5th Land in [place], [place]. The patient's son, Lauren Holt, works in a Proofreader in [place]. The patient's son, Lauren Holt, works in Quarry manager in [place].  The patient has 4 grandchildren. She currently does not belong to a church.      ADVANCED DIRECTIVES: Not in place.  At the 07/07/2017 visit the patient was given the appropriate documents to complete and notarized at her discretion.  HEALTH MAINTENANCE: Social History   Tobacco Use  . Smoking status: Former Smoker    Years: 1.00    Types: Cigarettes  . Smokeless tobacco: Never Used  . Tobacco comment: Smoked X 1 year at age 37 (less than ppd)  Substance Use Topics  . Alcohol use: Yes    Alcohol/week: 1.2 oz    Types: 2 Glasses of wine per week  . Drug use: Never     Colonoscopy: 02/2017 at [facility]  PAP:   Bone density: 2018/ osteopenia   No Known Allergies  Current Outpatient Medications  Medication Sig Dispense Refill  . ALPRAZolam (XANAX) 0.25 MG tablet Take 0.125 mg by mouth 2 (two) times daily as needed for anxiety.     Marland Kitchen anastrozole (ARIMIDEX) 1 MG tablet  Take 1 tablet (1 mg total) by mouth daily. 30 tablet 5  . aspirin 325 MG tablet Take 325 mg by mouth daily.    . Calcium Carb-Cholecalciferol (CALCIUM 600 + D PO) Take 1 tablet by mouth daily.    . Cholecalciferol (VITAMIN D-3) 5000 units TABS Take 1 tablet by mouth daily.    Marland Kitchen doxycycline (VIBRAMYCIN) 50 MG capsule Take 1 capsule (50 mg total) by mouth 2 (two) times daily. 12 capsule 0  . HYDROcodone-acetaminophen (NORCO/VICODIN) 5-325 MG tablet Take 1-2 tablets by mouth every 4 (four) hours as needed for moderate pain. 40 tablet 0  . Lutein 20 MG CAPS Take 1 capsule by mouth daily.     . Magnesium Citrate 100 MG TABS Take 1 tablet by mouth daily.    . methocarbamol (ROBAXIN) 500 MG tablet Take 1 tablet (500 mg total) by  mouth every 8 (eight) hours as needed for muscle spasms. 30 tablet 0  . Multiple Vitamins-Minerals (ICAPS AREDS FORMULA PO) Take 1 capsule by mouth daily.     . Probiotic Product (PROBIOTIC DAILY PO) Take 1 capsule by mouth every other day.    . Red Yeast Rice 600 MG CAPS Take 1 capsule by mouth daily.    Marland Kitchen zinc gluconate 50 MG tablet Take 50 mg by mouth daily as needed (cold prevention).     No current facility-administered medications for this visit.     OBJECTIVE: Middle-aged white woman wearing a binder postop  Vitals:   10/12/17 1133  BP: 117/69  Pulse: 80  Resp: 17  Temp: 98.6 F (37 C)  SpO2: 98%     Body mass index is 31.3 kg/m.   Wt Readings from Last 3 Encounters:  10/12/17 176 lb 11.2 oz (80.2 kg)  09/24/17 174 lb (78.9 kg)  09/21/17 174 lb 8 oz (79.2 kg)      ECOG FS:1 - Symptomatic but completely ambulatory  Sclerae unicteric, EOMs intact Oropharynx clear and moist No cervical or supraclavicular adenopathy Lungs no rales or rhonchi Heart regular rate and rhythm Abd soft, nontender, positive bowel sounds MSK no focal spinal tenderness, no upper extremity lymphedema Neuro: nonfocal, well oriented, appropriate affect Breasts: The right breast is status post mastectomy with expander in place.  There is one drain remaining, with approximately 10 cc serosanguineous fluid in the bulb. The left breast is unremarkable.  Both axillae are benign.    LAB RESULTS:  CMP     Component Value Date/Time   NA 139 09/21/2017 1138   K 4.2 09/21/2017 1138   CL 101 09/21/2017 1138   CO2 29 09/21/2017 1138   GLUCOSE 93 09/21/2017 1138   BUN 11 09/21/2017 1138   CREATININE 0.75 09/21/2017 1138   CALCIUM 9.5 09/21/2017 1138   GFRNONAA >60 09/21/2017 1138   GFRAA >60 09/21/2017 1138    No results found for: TOTALPROTELP, ALBUMINELP, A1GS, A2GS, BETS, BETA2SER, GAMS, MSPIKE, SPEI  No results found for: KPAFRELGTCHN, LAMBDASER, KAPLAMBRATIO  Lab Results  Component Value  Date   WBC 7.2 10/12/2017   NEUTROABS 4.4 10/12/2017   HGB 13.2 10/12/2017   HCT 40.4 10/12/2017   MCV 97.6 10/12/2017   PLT 307 10/12/2017    '@LASTCHEMISTRY'$ @  No results found for: LABCA2  No components found for: [id number]  No results for input(s): INR in the last 168 hours.  No results found for: LABCA2  No results found for: JTT017  No results found for: BLT903  No results found for: ESP233  No  results found for: CA2729  No components found for: HGQUANT  No results found for: CEA1 / No results found for: CEA1   No results found for: AFPTUMOR  No results found for: CHROMOGRNA  No results found for: PSA1  Appointment on 10/12/2017  Component Date Value Ref Range Status  . WBC Count 10/12/2017 7.2  3.9 - 10.3 K/uL Final  . RBC 10/12/2017 4.14  3.70 - 5.45 MIL/uL Final  . Hemoglobin 10/12/2017 13.2  11.6 - 15.9 g/dL Final  . HCT 10/12/2017 40.4  34.8 - 46.6 % Final  . MCV 10/12/2017 97.6  79.5 - 101.0 fL Final  . MCH 10/12/2017 31.9  25.1 - 34.0 pg Final  . MCHC 10/12/2017 32.7  31.5 - 36.0 g/dL Final  . RDW 10/12/2017 13.1  11.2 - 14.5 % Final  . Platelet Count 10/12/2017 307  145 - 400 K/uL Final  . Neutrophils Relative % 10/12/2017 61  % Final  . Neutro Abs 10/12/2017 4.4  1.5 - 6.5 K/uL Final  . Lymphocytes Relative 10/12/2017 30  % Final  . Lymphs Abs 10/12/2017 2.2  0.9 - 3.3 K/uL Final  . Monocytes Relative 10/12/2017 7  % Final  . Monocytes Absolute 10/12/2017 0.5  0.1 - 0.9 K/uL Final  . Eosinophils Relative 10/12/2017 2  % Final  . Eosinophils Absolute 10/12/2017 0.1  0.0 - 0.5 K/uL Final  . Basophils Relative 10/12/2017 0  % Final  . Basophils Absolute 10/12/2017 0.0  0.0 - 0.1 K/uL Final   Performed at [facility], [place] [address], [place], [place] [address]    (this displays the last labs from the last 3 days)  No results found for: TOTALPROTELP, ALBUMINELP, A1GS, A2GS, BETS, BETA2SER, GAMS, MSPIKE, SPEI (this  displays SPEP labs)  No results found for: KPAFRELGTCHN, LAMBDASER, KAPLAMBRATIO (kappa/lambda light chains)  No results found for: HGBA, HGBA2QUANT, HGBFQUANT, HGBSQUAN (Hemoglobinopathy evaluation)   No results found for: LDH  No results found for: IRON, TIBC, IRONPCTSAT (Iron and TIBC)  No results found for: FERRITIN  Urinalysis No results found for: COLORURINE, APPEARANCEUR, LABSPEC, PHURINE, GLUCOSEU, HGBUR, BILIRUBINUR, KETONESUR, PROTEINUR, UROBILINOGEN, NITRITE, LEUKOCYTESUR   STUDIES: Nm Sentinel Node Inj-no Rpt (breast)  Result Date: 09/24/2017 Sulfur colloid was injected by the nuclear medicine technologist for melanoma sentinel node.    ELIGIBLE FOR AVAILABLE RESEARCH PROTOCOL: exact sciences study  ASSESSMENT: 77 y.o. [place], [place] woman status post right breast biopsy 07/01/2017 for a clinical mT2 N0, stage IB invasive ductal carcinoma, grade 1, estrogen and progesterone receptor positive, HER-2 nonamplified, with an MIB-105%  (1) a second area in the right breast biopsied 07/01/2017 showed ductal carcinoma in situ, grade 3, estrogen and progesterone receptor positive.  (2) status post right mastectomy and sentinel lymph node sampling 09/24/2017 for a pT1b pN0, stage IA invasive ductal carcinoma, grade 1, with negative margins  (a) a total of 5 sentinel lymph nodes were removed  (3) The Oncotype DX score was 28, predicting a risk of outside the breast recurrence over the next 9 years of 17% if the patient's only systemic therapy is tamoxifen for 5 years.  It also predicts a significant benefit from chemotherapy.  (4) cyclophosphamide, methotrexate and fluorouracil (CMF) chemotherapy to start 11/17/2017, repeated every 21 days x 8  (5) anastrozole started 08/04/2016, interrupted during chemotherapy  (6) genetics testing 07/22/2017 through the Common Hereditary Cancer Panel offered by [facility] found no deleterious mutations in APC, ATM, AXIN2, BARD1, BMPR1A, BRCA1,  BRCA2, BRIP1, CDH1,  CDKN2A (p14ARF), CDKN2A (p16INK4a), CKD4, CHEK2, CTNNA1, DICER1, EPCAM (Deletion/duplication testing only), GREM1 (promoter region deletion/duplication testing only), KIT, MEN1, MLH1, MSH2, MSH3, MSH6, MUTYH, NBN, NF1, NHTL1, PALB2, PDGFRA, PMS2, POLD1, POLE, PTEN, RAD50, RAD51C, RAD51D, SDHB, SDHC, SDHD, SMAD4, SMARCA4. STK11, TP53, TSC1, TSC2, and VHL.  The following genes were evaluated for sequence changes only: SDHA and HOXB13 c.251G>A variant only.   (a) a variant of uncertain significance in the gene DICER1 was identified.  c.2116+6G>A (Intronic)  PLAN: We spent a little over 30 minutes today going over Claritza's situation.  She has a small tumor which is not fast growing in which does not appear aggressive morphologically.  Nevertheless it has a high recurrence score.  We reviewed the numbers and she understands if she takes anastrozole for 5 years and that is her only systemic therapy then she will have a 17% chance of developing stage IV breast cancer within the next 9 years.  The alternative way of looking at that of course is that without chemotherapy she would have an 83% chance of not developing stage IV breast cancer in the next 5 years.  The Oncotype also predicts a significant improvement in risk reduction if she also receives chemotherapy.  With chemotherapy her chance of not developing stage IV disease within the next 9 years will certainly be less than 10% and possibly less than 5%  We then discussed standard chemotherapy which would include cyclophosphamide and doxorubicin followed by paclitaxel.  We discussed the possible side effects including the possibility of heart muscle damage and peripheral neuropathy as well as significant issues regarding immunosuppression.  We discussed alternatives including CMF chemotherapy, which might allow her to keep her hair and does not have issues regarding heart or peripheral neuropathy.  Immunosuppression is also not so  severe.  After much discussion she decided to forego the possible difference in risk of recurrence between these 2 treatments and opted for CMF.  She has a trip planned to [place] mid July.  She will need a port.  She will need to come to chemotherapy school.  Accordingly we will start her chemotherapy 11/17/2017.  I will see her before then to review how to take her supportive medications  She knows to call for any other issues that may develop before the next visit.    Magrinat, Virgie Dad, MD  10/12/17 11:51 AM Medical Oncology and Hematology [facility] [address] [place], [place] [address] Tel. [phone]    Fax. [phone]  Alice Rieger, am acting as scribe for Chauncey Cruel MD.  I, Lurline Del MD, have reviewed the above documentation for accuracy and completeness, and I agree with the above.

## 2017-10-12 ENCOUNTER — Encounter: Payer: Self-pay | Admitting: Oncology

## 2017-10-12 ENCOUNTER — Inpatient Hospital Stay: Payer: Medicare HMO

## 2017-10-12 ENCOUNTER — Inpatient Hospital Stay: Payer: Medicare HMO | Attending: Oncology | Admitting: Oncology

## 2017-10-12 ENCOUNTER — Telehealth: Payer: Self-pay | Admitting: Oncology

## 2017-10-12 ENCOUNTER — Telehealth: Payer: Self-pay | Admitting: *Deleted

## 2017-10-12 VITALS — BP 117/69 | HR 80 | Temp 98.6°F | Resp 17 | Ht 63.0 in | Wt 176.7 lb

## 2017-10-12 DIAGNOSIS — C50311 Malignant neoplasm of lower-inner quadrant of right female breast: Secondary | ICD-10-CM | POA: Diagnosis not present

## 2017-10-12 DIAGNOSIS — Z170 Estrogen receptor positive status [ER+]: Secondary | ICD-10-CM

## 2017-10-12 DIAGNOSIS — Z79899 Other long term (current) drug therapy: Secondary | ICD-10-CM | POA: Insufficient documentation

## 2017-10-12 DIAGNOSIS — F419 Anxiety disorder, unspecified: Secondary | ICD-10-CM | POA: Diagnosis not present

## 2017-10-12 DIAGNOSIS — Z87891 Personal history of nicotine dependence: Secondary | ICD-10-CM

## 2017-10-12 DIAGNOSIS — Z803 Family history of malignant neoplasm of breast: Secondary | ICD-10-CM | POA: Insufficient documentation

## 2017-10-12 LAB — CBC WITH DIFFERENTIAL (CANCER CENTER ONLY)
BASOS ABS: 0 10*3/uL (ref 0.0–0.1)
BASOS PCT: 0 %
EOS PCT: 2 %
Eosinophils Absolute: 0.1 10*3/uL (ref 0.0–0.5)
HEMATOCRIT: 40.4 % (ref 34.8–46.6)
Hemoglobin: 13.2 g/dL (ref 11.6–15.9)
LYMPHS PCT: 30 %
Lymphs Abs: 2.2 10*3/uL (ref 0.9–3.3)
MCH: 31.9 pg (ref 25.1–34.0)
MCHC: 32.7 g/dL (ref 31.5–36.0)
MCV: 97.6 fL (ref 79.5–101.0)
MONO ABS: 0.5 10*3/uL (ref 0.1–0.9)
Monocytes Relative: 7 %
Neutro Abs: 4.4 10*3/uL (ref 1.5–6.5)
Neutrophils Relative %: 61 %
PLATELETS: 307 10*3/uL (ref 145–400)
RBC: 4.14 MIL/uL (ref 3.70–5.45)
RDW: 13.1 % (ref 11.2–14.5)
WBC: 7.2 10*3/uL (ref 3.9–10.3)

## 2017-10-12 LAB — CMP (CANCER CENTER ONLY)
ALT: 13 U/L (ref 0–44)
AST: 15 U/L (ref 15–41)
Albumin: 3.8 g/dL (ref 3.5–5.0)
Alkaline Phosphatase: 79 U/L (ref 38–126)
Anion gap: 8 (ref 5–15)
BILIRUBIN TOTAL: 0.2 mg/dL — AB (ref 0.3–1.2)
BUN: 16 mg/dL (ref 8–23)
CO2: 30 mmol/L (ref 22–32)
Calcium: 9.2 mg/dL (ref 8.9–10.3)
Chloride: 100 mmol/L (ref 98–111)
Creatinine: 0.76 mg/dL (ref 0.44–1.00)
Glucose, Bld: 93 mg/dL (ref 70–99)
POTASSIUM: 4.4 mmol/L (ref 3.5–5.1)
Sodium: 138 mmol/L (ref 135–145)
TOTAL PROTEIN: 7.1 g/dL (ref 6.5–8.1)

## 2017-10-12 MED ORDER — ANASTROZOLE 1 MG PO TABS: 1.0000 mg | ORAL_TABLET | Freq: Every day | ORAL | 5 refills | Status: DC

## 2017-10-12 NOTE — Progress Notes (Signed)
START OFF PATHWAY REGIMEN - Breast   [id number]:CMF (IV cyclophosphamide) q21 days:   A cycle is every 21 days:     Cyclophosphamide      Methotrexate      5-Fluorouracil   **Always confirm dose/schedule in your pharmacy ordering system**  Patient Characteristics: Postoperative without Neoadjuvant Therapy (Pathologic Staging), Invasive Disease, Adjuvant Therapy, HER2 Negative/Unknown/Equivocal, ER Positive, Node Negative, pT1a-c, pN0/N51m or pT2 or Higher, pN0, Oncotype High Risk (? 26) Therapeutic Status: Postoperative without Neoadjuvant Therapy (Pathologic Staging) AJCC Grade: G1 AJCC N Category: pN0 AJCC M Category: cM0 ER Status: Positive (+) AJCC 8 Stage Grouping: IA HER2 Status: Negative (-) Oncotype Dx Recurrence Score: 28 AJCC T Category: pT1 PR Status: Positive (+) Has this patient completed genomic testing<= Yes - Oncotype DX(R) Intent of Therapy: Curative Intent, Discussed with Patient

## 2017-10-12 NOTE — Telephone Encounter (Signed)
Patient scheduled per 7/2 los. Patient requested to move tx days to wed. Provider aware. Gave patient avs and calendar of upcoming appts.

## 2017-10-12 NOTE — Telephone Encounter (Signed)
Received oncotype results of 28.  Patient is seeing Dr. Jana Hakim today 7/2.

## 2017-10-18 ENCOUNTER — Ambulatory Visit: Payer: Self-pay | Admitting: General Surgery

## 2017-10-18 ENCOUNTER — Encounter: Payer: Self-pay | Admitting: Physical Therapy

## 2017-10-18 ENCOUNTER — Ambulatory Visit: Payer: Medicare HMO | Attending: General Surgery | Admitting: Physical Therapy

## 2017-10-18 ENCOUNTER — Other Ambulatory Visit: Payer: Self-pay

## 2017-10-18 DIAGNOSIS — Z483 Aftercare following surgery for neoplasm: Secondary | ICD-10-CM | POA: Diagnosis not present

## 2017-10-18 DIAGNOSIS — Z170 Estrogen receptor positive status [ER+]: Secondary | ICD-10-CM | POA: Insufficient documentation

## 2017-10-18 DIAGNOSIS — R293 Abnormal posture: Secondary | ICD-10-CM | POA: Diagnosis not present

## 2017-10-18 DIAGNOSIS — C50311 Malignant neoplasm of lower-inner quadrant of right female breast: Secondary | ICD-10-CM

## 2017-10-18 DIAGNOSIS — M79601 Pain in right arm: Secondary | ICD-10-CM | POA: Insufficient documentation

## 2017-10-18 DIAGNOSIS — M25611 Stiffness of right shoulder, not elsewhere classified: Secondary | ICD-10-CM | POA: Diagnosis not present

## 2017-10-18 NOTE — Therapy (Signed)
[place], [place], [address] Phone: [phone]   Fax:  [phone]  Physical Therapy Treatment  Patient Details  Name: Lauren Holt MRN: [id number] Date of Birth: Aug 20, 1940 Referring Provider: Dr. Autumn Messing   Encounter Date: 10/18/2017  PT End of Session - 10/18/17 1352    Visit Number  2    Number of Visits  10    Date for PT Re-Evaluation  11/15/17    PT Start Time  8299    PT Stop Time  1355    PT Time Calculation (min)  50 min    Activity Tolerance  Patient tolerated treatment well    Behavior During Therapy  [facility] for tasks assessed/performed       Past Medical History:  Diagnosis Date  . Anxiety   . Breast cancer, right breast ([place])   . Complication of anesthesia    difficult to wake - many years ago with tubal 50 years ago  . Dermatitis   . Family history of adverse reaction to anesthesia    "I think my sister was hard to wake up" (09/24/2017)  . Family history of breast cancer 07/12/2017  . Family history of prostate cancer 07/12/2017  . Genetic testing   . Palpitations    when she lays down, has not seen anyone for this, feels like a flutter    Past Surgical History:  Procedure Laterality Date  . BREAST BIOPSY Right 06/2017  . BREAST RECONSTRUCTION WITH PLACEMENT OF TISSUE EXPANDER AND ALLODERM Right 09/24/2017  . BREAST RECONSTRUCTION WITH PLACEMENT OF TISSUE EXPANDER AND ALLODERM Right 09/24/2017   Procedure: BREAST RECONSTRUCTION WITH PLACEMENT OF TISSUE EXPANDER AND ALLODERM;  Surgeon: Irene Limbo, MD;  Location: [place];  Service: Plastics;  Laterality: Right;  . CARPAL TUNNEL RELEASE Bilateral   . COLONOSCOPY    . MASTECTOMY COMPLETE / SIMPLE W/ SENTINEL NODE BIOPSY Right 09/24/2017  . MASTECTOMY W/ SENTINEL NODE BIOPSY Right 09/24/2017   Procedure: MASTECTOMY WITH SENTINEL LYMPH NODE BIOPSY;  Surgeon: Jovita Kussmaul, MD;  Location: [place];  Service: General;  Laterality:  Right;  . TUBAL LIGATION    . VAGINAL HYSTERECTOMY      There were no vitals filed for this visit.  Subjective Assessment - 10/18/17 1311    Subjective  Patient underwent a right mastectomy and sentinel node biopsy (0/5 nodes) on 09/24/17 with an expander placed. Last drain was removed 10/13/17. Her Oncotype score was 28 so she will undergo chemotherapy 11/17/17. Reconstruction will occur after completion of chemotherapy. She began anti-estrogen therapy before surgery.    Pertinent History  Right mastectomy SLNB 09/24/17.    Patient Stated Goals  Make sure my arm is ok    Currently in Pain?  No/denies Some numbness right axillary region         [facility] PT Assessment - 10/18/17 0001      Assessment   Medical Diagnosis  s/p right mastectomy SLNB    Referring Provider  Dr. Autumn Messing    Onset Date/Surgical Date  09/24/17    Hand Dominance  Right    Prior Therapy  Baselines      Precautions   Precautions  Other (comment)    Precaution Comments  Right arm lymphedema risk      Restrictions   Weight Bearing Restrictions  No      Balance Screen   Has the patient fallen in the past 6 months  No    Has  the patient had a decrease in activity level because of a fear of falling?   No    Is the patient reluctant to leave their home because of a fear of falling?   No      Home Film/video editor residence    [place] Adult son    Available Help at Discharge  Family      Prior Function   Level of [place]  Retired    Leisure  She has not yet returned to her walking exercise      Cognition   Overall Cognitive Status  Within Functional Limits for tasks assessed      Observation/Other Assessments   Observations  Expander on right chest sits very superior and dips in with very light touch. Photo taken. No c/o tenderness.      AROM   AROM Assessment Site  Shoulder;Cervical    Right/Left Shoulder  Right    Right  Shoulder Extension  28 Degrees    Right Shoulder Flexion  67 Degrees    Right Shoulder ABduction  42 Degrees        LYMPHEDEMA/ONCOLOGY QUESTIONNAIRE - 10/18/17 1323      Type   Cancer Type  Right breast cancer      Surgeries   Mastectomy Date  09/24/17    Sentinel Lymph Node Biopsy Date  09/24/17    Number Lymph Nodes Removed  5      Treatment   Active Chemotherapy Treatment  No    Past Chemotherapy Treatment  No    Active Radiation Treatment  No    Past Radiation Treatment  No    Current Hormone Treatment  Yes    Date  08/03/17    Drug Name  Anastrozole    Past Hormone Therapy  No      What other symptoms do you have   Are you Having Heaviness or Tightness  Yes    Are you having Pain  No    Are you having pitting edema  No    Is it Hard or Difficult finding clothes that fit  No    Do you have infections  No    Is there Decreased scar mobility  Yes    Stemmer Sign  No      Lymphedema Assessments   Lymphedema Assessments  Upper extremities      Right Upper Extremity Lymphedema   10 cm Proximal to Olecranon Process  28.1 cm    Olecranon Process  24.8 cm    15 cm Proximal to Ulnar Styloid Process  23.3 cm    10 cm Proximal to Ulnar Styloid Process  19.8 cm    Just Proximal to Ulnar Styloid Process  14.1 cm    Across Hand at [employer]  18 cm    At [place] of 2nd Digit  5.9 cm        Quick Dash - 10/18/17 0001    Open a tight or new jar  No difficulty    Do heavy household chores (wash walls, wash floors)  Unable    Carry a shopping bag or briefcase  Mild difficulty    Wash your back  Mild difficulty    Use a knife to cut food  No difficulty    Recreational activities in which you take some force or impact through your arm, shoulder, or hand (golf, hammering, tennis)  Unable  During the past week, to what extent has your arm, shoulder or hand problem interfered with your normal social activities with family, friends, neighbors, or groups?  Slightly     During the past week, to what extent has your arm, shoulder or hand problem limited your work or other regular daily activities  Modererately    Arm, shoulder, or hand pain.  Mild    Tingling (pins and needles) in your arm, shoulder, or hand  None    Difficulty Sleeping  No difficulty    DASH Score  31.82 %             [facility] Adult PT Treatment/Exercise - 10/18/17 0001      Exercises   Exercises  Shoulder      Shoulder Exercises: Pulleys   Flexion  2 minutes    Flexion Limitations  Verbal cues for proper technique    ABduction  2 minutes    ABduction Limitations  Verbal cues for proper technique             PT Education - 10/18/17 1351    Education provided  Yes    Education Details  Post op shoulder HEP reviewed with pt. Also educated pt on where to get a compression sleeve for flying (Dr. Marlou Starks wrote order today)    Person(s) Educated  Patient    Methods  Explanation;Demonstration;Handout    Comprehension  Returned demonstration;Verbalized understanding          PT Long Term Goals - 10/18/17 1416      PT LONG TERM GOAL #1   Title  Patient will demonstrate she has returned to baseline related to shoulder ROM and function.    Time  4    Period  Weeks    Status  On-going      PT LONG TERM GOAL #2   Title  Patient will increase right shoulder active flexion ROM to >/= 120 degrees for increased function and ease reaching overhead.    Baseline  67 degrees    Time  4    Period  Weeks    Status  New      PT LONG TERM GOAL #3   Title  Patient will increase right shoulder active abduction ROM to >/= 120 degrees for increased function and ease reaching overhead.    Baseline  42 degrees    Time  4    Period  Weeks    Status  New      PT LONG TERM GOAL #4   Title  Patient will report she has returned to >/= 50% of household tasks.    Time  4    Period  Weeks    Status  New      PT LONG TERM GOAL #5   Title  Patient will verbalize understanding of risk  reduction practices for lymphedema.    Time  4    Period  Weeks    Status  New      Additional Long Term Goals   Additional Long Term Goals  Yes      PT LONG TERM GOAL #6   Title  Patient will reduce DASH score to </= 10 to increased overall upper extremity function.    Baseline  31    Time  4    Period  Weeks    Status  New      Breast Clinic Goals - 07/07/17 1958      Patient will be able to  verbalize understanding of pertinent lymphedema risk reduction practices relevant to her diagnosis specifically related to skin care.   Time  1    Period  Days    Status  Achieved      Patient will be able to return demonstrate and/or verbalize understanding of the post-op home exercise program related to regaining shoulder range of motion.   Time  1    Period  Days    Status  Achieved      Patient will be able to verbalize understanding of the importance of attending the postoperative After Breast Cancer Class for further lymphedema risk reduction education and therapeutic exercise.   Time  1    Period  Days    Status  Achieved           Plan - 10/18/17 1352    Clinical Impression Statement  Patient is almost 4 weeks s/p right mastectomy with SLNB (0/5 nodes) for ER/PR positive, HER2 negative invasive ductal carcinoma breast cancer. She had an expander placed. She will undergo a port placement after she returns from vacation and then begin chemotherapy on 11/17/17. She is very limited with shoulder ROM and will benefit from physical therapy to address those deficits.     Rehab Potential  Excellent    Clinical Impairments Affecting Rehab Potential  Pt having ahard time looking at her incisions and chest since surgery and is uneasy with moving her arm so she needs encouragement.    PT Frequency  2x / week    PT Duration  4 weeks    PT Treatment/Interventions  ADLs/Self Care Home Management;Therapeutic exercise;Patient/family education;Therapeutic activities;Scar mobilization;Manual  lymph drainage;Passive range of motion    PT Next Visit Plan  Pulleys, PROM right shoulder, AAROM exercises    PT Home Exercise Plan  Post op shoulder ROM HEP    Consulted and Agree with Plan of Care  Patient       Patient will benefit from skilled therapeutic intervention in order to improve the following deficits and impairments:  Decreased knowledge of precautions, Impaired UE functional use, Decreased range of motion, Postural dysfunction, Pain, Decreased scar mobility, Increased fascial restricitons  Visit Diagnosis: Malignant neoplasm of lower-inner quadrant of right breast of female, estrogen receptor positive ([place]) - Plan: PT plan of care cert/re-cert  Abnormal posture - Plan: PT plan of care cert/re-cert  Aftercare following surgery for neoplasm - Plan: PT plan of care cert/re-cert  Stiffness of right shoulder, not elsewhere classified - Plan: PT plan of care cert/re-cert     Problem List Patient Active Problem List   Diagnosis Date Noted  . Genetic testing 07/23/2017  . Family history of breast cancer 07/12/2017  . Family history of prostate cancer 07/12/2017  . Malignant neoplasm of lower-inner quadrant of right breast of female, estrogen receptor positive ([place]) 07/02/2017  . DEPRESSION 12/31/2006  . CARPAL TUNNEL SYNDROME 12/31/2006  . ARTHRALGIA, TMJ 12/31/2006  . ROSACEA 12/31/2006  . ARTHRALGIA 12/31/2006  . FIBROMYALGIA 12/31/2006  . SLEEP APNEA 12/31/2006    Annia Friendly, PT 10/18/17 2:45 PM  [place], [place], [address] Phone: [phone]   Fax:  [phone]  Name: Lauren Holt MRN: [id number] Date of Birth: 10/04/1940

## 2017-10-19 DIAGNOSIS — R69 Illness, unspecified: Secondary | ICD-10-CM | POA: Diagnosis not present

## 2017-10-20 DIAGNOSIS — C50911 Malignant neoplasm of unspecified site of right female breast: Secondary | ICD-10-CM | POA: Diagnosis not present

## 2017-10-25 ENCOUNTER — Encounter: Payer: Medicare HMO | Admitting: Physical Therapy

## 2017-10-28 ENCOUNTER — Ambulatory Visit: Payer: Medicare HMO | Admitting: Rehabilitation

## 2017-10-28 ENCOUNTER — Encounter: Payer: Self-pay | Admitting: Rehabilitation

## 2017-10-28 DIAGNOSIS — R293 Abnormal posture: Secondary | ICD-10-CM | POA: Diagnosis not present

## 2017-10-28 DIAGNOSIS — Z170 Estrogen receptor positive status [ER+]: Secondary | ICD-10-CM | POA: Diagnosis not present

## 2017-10-28 DIAGNOSIS — Z483 Aftercare following surgery for neoplasm: Secondary | ICD-10-CM

## 2017-10-28 DIAGNOSIS — M25611 Stiffness of right shoulder, not elsewhere classified: Secondary | ICD-10-CM | POA: Diagnosis not present

## 2017-10-28 DIAGNOSIS — M79601 Pain in right arm: Secondary | ICD-10-CM

## 2017-10-28 DIAGNOSIS — C50311 Malignant neoplasm of lower-inner quadrant of right female breast: Secondary | ICD-10-CM

## 2017-10-28 NOTE — Patient Instructions (Signed)
Access Code: [email or web address]  URL: [email or web address]  Date: 10/28/2017  Prepared by: Shan Levans   Exercises  Supine Shoulder Flexion Extension AAROM with Dowel - 10 reps - 1 sets - 6 hold - 2x daily - 7x weekly  Shoulder Flexion Overhead with Dowel - 10 reps - 1 sets - 2x daily - 7x weekly  Standing Shoulder Abduction ROM with Dowel - 10 reps - 1 sets - 2x daily - 7x weekly

## 2017-10-28 NOTE — Therapy (Signed)
[place], [place], [address] Phone: [phone]   Fax:  [phone]  Physical Therapy Treatment  Patient Details  Name: Lauren Holt MRN: [id number] Date of Birth: 09-23-1940 Referring Provider: Dr. Autumn Messing   Encounter Date: 10/28/2017  PT End of Session - 10/28/17 1651    Visit Number  4    Number of Visits  10    Date for PT Re-Evaluation  11/15/17    PT Start Time  1600    PT Stop Time  1645    PT Time Calculation (min)  45 min    Activity Tolerance  Patient tolerated treatment well    Behavior During Therapy  [facility] for tasks assessed/performed       Past Medical History:  Diagnosis Date  . Anxiety   . Breast cancer, right breast ([place])   . Complication of anesthesia    difficult to wake - many years ago with tubal 50 years ago  . Dermatitis   . Family history of adverse reaction to anesthesia    "I think my sister was hard to wake up" (09/24/2017)  . Family history of breast cancer 07/12/2017  . Family history of prostate cancer 07/12/2017  . Genetic testing   . Palpitations    when she lays down, has not seen anyone for this, feels like a flutter    Past Surgical History:  Procedure Laterality Date  . BREAST BIOPSY Right 06/2017  . BREAST RECONSTRUCTION WITH PLACEMENT OF TISSUE EXPANDER AND ALLODERM Right 09/24/2017  . BREAST RECONSTRUCTION WITH PLACEMENT OF TISSUE EXPANDER AND ALLODERM Right 09/24/2017   Procedure: BREAST RECONSTRUCTION WITH PLACEMENT OF TISSUE EXPANDER AND ALLODERM;  Surgeon: Irene Limbo, MD;  Location: [place];  Service: Plastics;  Laterality: Right;  . CARPAL TUNNEL RELEASE Bilateral   . COLONOSCOPY    . MASTECTOMY COMPLETE / SIMPLE W/ SENTINEL NODE BIOPSY Right 09/24/2017  . MASTECTOMY W/ SENTINEL NODE BIOPSY Right 09/24/2017   Procedure: MASTECTOMY WITH SENTINEL LYMPH NODE BIOPSY;  Surgeon: Jovita Kussmaul, MD;  Location: [place];  Service: General;  Laterality:  Right;  . TUBAL LIGATION    . VAGINAL HYSTERECTOMY      There were no vitals filed for this visit.  Subjective Assessment - 10/28/17 1601    Subjective  Just tired today.  Just got back from [place] late last night.  No swelling in the UE    Patient Stated Goals  Make sure my arm is ok    Currently in Pain?  Yes    Pain Score  4     Pain Location  Arm    Pain Orientation  Right forearm region    Pain Descriptors / Indicators  Aching    Pain Type  Surgical pain    Aggravating Factors   certain movements like reaching out to the side    Pain Relieving Factors  unknown         [facility] PT Assessment - 10/28/17 0001      AROM   Right Shoulder Flexion  95 Degrees    Right Shoulder ABduction  75 Degrees                   [facility] Adult PT Treatment/Exercise - 10/28/17 0001      Shoulder Exercises: Pulleys   Flexion  2 minutes    Flexion Limitations  verbal cues to relax     ABduction  2 minutes    ABduction  Limitations  Verbal cues for proper technique      Shoulder Exercises: ROM/Strengthening   Other ROM/Strengthening Exercises  standing cane flexion and abduction x 10 each with vcs for straight arm and performance.  Supine cane flexion x 10 .  All added to HEP      Manual Therapy   Manual Therapy  Passive ROM;Myofascial release                  PT Long Term Goals - 10/18/17 1416      PT LONG TERM GOAL #1   Title  Patient will demonstrate she has returned to baseline related to shoulder ROM and function.    Time  4    Period  Weeks    Status  On-going      PT LONG TERM GOAL #2   Title  Patient will increase right shoulder active flexion ROM to >/= 120 degrees for increased function and ease reaching overhead.    Baseline  67 degrees    Time  4    Period  Weeks    Status  New      PT LONG TERM GOAL #3   Title  Patient will increase right shoulder active abduction ROM to >/= 120 degrees for increased function and ease reaching overhead.     Baseline  42 degrees    Time  4    Period  Weeks    Status  New      PT LONG TERM GOAL #4   Title  Patient will report she has returned to >/= 50% of household tasks.    Time  4    Period  Weeks    Status  New      PT LONG TERM GOAL #5   Title  Patient will verbalize understanding of risk reduction practices for lymphedema.    Time  4    Period  Weeks    Status  New      Additional Long Term Goals   Additional Long Term Goals  Yes      PT LONG TERM GOAL #6   Title  Patient will reduce DASH score to </= 10 to increased overall upper extremity function.    Baseline  31    Time  4    Period  Weeks    Status  New      Breast Clinic Goals - 07/07/17 1958      Patient will be able to verbalize understanding of pertinent lymphedema risk reduction practices relevant to her diagnosis specifically related to skin care.   Time  1    Period  Days    Status  Achieved      Patient will be able to return demonstrate and/or verbalize understanding of the post-op home exercise program related to regaining shoulder range of motion.   Time  1    Period  Days    Status  Achieved      Patient will be able to verbalize understanding of the importance of attending the postoperative After Breast Cancer Class for further lymphedema risk reduction education and therapeutic exercise.   Time  1    Period  Days    Status  Achieved           Plan - 10/28/17 1651    Clinical Impression Statement  Pt still with very limited ROM today but improved almost x2 into flexion and abduction from eval visit.  Significant cording from the forearm to  the breast tissue evident today with most of her arm pain from this.  3 cords evident in the antecubital fossa and puckering in the forearm.  Added more ROM to HEP    PT Frequency  2x / week    PT Duration  4 weeks    PT Treatment/Interventions  ADLs/Self Care Home Management;Therapeutic exercise;Patient/family education;Therapeutic activities;Scar  mobilization;Manual lymph drainage;Passive range of motion    PT Next Visit Plan  Pulleys, PROM right shoulder, AAROM exercises (not supposed to do much strengthening per surgeon conversation with Inez Catalina)       Patient will benefit from skilled therapeutic intervention in order to improve the following deficits and impairments:  Decreased knowledge of precautions, Impaired UE functional use, Decreased range of motion, Postural dysfunction, Pain, Decreased scar mobility, Increased fascial restricitons  Visit Diagnosis: Malignant neoplasm of lower-inner quadrant of right breast of female, estrogen receptor positive ([place])  Aftercare following surgery for neoplasm  Stiffness of right shoulder, not elsewhere classified  Pain in right arm     Problem List Patient Active Problem List   Diagnosis Date Noted  . Genetic testing 07/23/2017  . Family history of breast cancer 07/12/2017  . Family history of prostate cancer 07/12/2017  . Malignant neoplasm of lower-inner quadrant of right breast of female, estrogen receptor positive ([place]) 07/02/2017  . DEPRESSION 12/31/2006  . CARPAL TUNNEL SYNDROME 12/31/2006  . ARTHRALGIA, TMJ 12/31/2006  . ROSACEA 12/31/2006  . ARTHRALGIA 12/31/2006  . FIBROMYALGIA 12/31/2006  . SLEEP APNEA 12/31/2006    Shan Levans, PT 10/28/2017, 4:54 PM  [place], [place], [address] Phone: [phone]   Fax:  [phone]  Name: Marybell Robards MRN: [id number] Date of Birth: Oct 07, 1940

## 2017-11-01 ENCOUNTER — Ambulatory Visit: Payer: Medicare HMO | Admitting: Rehabilitation

## 2017-11-01 ENCOUNTER — Encounter: Payer: Self-pay | Admitting: Rehabilitation

## 2017-11-01 DIAGNOSIS — M79601 Pain in right arm: Secondary | ICD-10-CM

## 2017-11-01 DIAGNOSIS — R293 Abnormal posture: Secondary | ICD-10-CM | POA: Diagnosis not present

## 2017-11-01 DIAGNOSIS — C50311 Malignant neoplasm of lower-inner quadrant of right female breast: Secondary | ICD-10-CM | POA: Diagnosis not present

## 2017-11-01 DIAGNOSIS — Z483 Aftercare following surgery for neoplasm: Secondary | ICD-10-CM

## 2017-11-01 DIAGNOSIS — M25611 Stiffness of right shoulder, not elsewhere classified: Secondary | ICD-10-CM

## 2017-11-01 DIAGNOSIS — Z170 Estrogen receptor positive status [ER+]: Principal | ICD-10-CM

## 2017-11-01 NOTE — Therapy (Signed)
[place], [place], [address] Phone: [phone]   Fax:  [phone]  Physical Therapy Treatment  Patient Details  Name: Lauren Holt MRN: [id number] Date of Birth: 1940/06/14 Referring Provider: Dr. Autumn Messing   Encounter Date: 11/01/2017  PT End of Session - 11/01/17 1305    Visit Number  5    Number of Visits  10    Date for PT Re-Evaluation  11/15/17    PT Start Time  1302    PT Stop Time  1340    PT Time Calculation (min)  38 min    Activity Tolerance  Patient tolerated treatment well    Behavior During Therapy  [facility] for tasks assessed/performed       Past Medical History:  Diagnosis Date  . Anxiety   . Breast cancer, right breast ([place])   . Complication of anesthesia    difficult to wake - many years ago with tubal 50 years ago  . Dermatitis   . Family history of adverse reaction to anesthesia    "I think my sister was hard to wake up" (09/24/2017)  . Family history of breast cancer 07/12/2017  . Family history of prostate cancer 07/12/2017  . Genetic testing   . Palpitations    when she lays down, has not seen anyone for this, feels like a flutter    Past Surgical History:  Procedure Laterality Date  . BREAST BIOPSY Right 06/2017  . BREAST RECONSTRUCTION WITH PLACEMENT OF TISSUE EXPANDER AND ALLODERM Right 09/24/2017  . BREAST RECONSTRUCTION WITH PLACEMENT OF TISSUE EXPANDER AND ALLODERM Right 09/24/2017   Procedure: BREAST RECONSTRUCTION WITH PLACEMENT OF TISSUE EXPANDER AND ALLODERM;  Surgeon: Irene Limbo, MD;  Location: [place];  Service: Plastics;  Laterality: Right;  . CARPAL TUNNEL RELEASE Bilateral   . COLONOSCOPY    . MASTECTOMY COMPLETE / SIMPLE W/ SENTINEL NODE BIOPSY Right 09/24/2017  . MASTECTOMY W/ SENTINEL NODE BIOPSY Right 09/24/2017   Procedure: MASTECTOMY WITH SENTINEL LYMPH NODE BIOPSY;  Surgeon: Jovita Kussmaul, MD;  Location: [place];  Service: General;  Laterality:  Right;  . TUBAL LIGATION    . VAGINAL HYSTERECTOMY      There were no vitals filed for this visit.  Subjective Assessment - 11/01/17 1300    Subjective  I am using it much more     Currently in Pain?  No/denies         [facility] PT Assessment - 11/01/17 0001      AROM   Right Shoulder Flexion  125 Degrees    Right Shoulder ABduction  105 Degrees                   [facility] Adult PT Treatment/Exercise - 11/01/17 0001      Shoulder Exercises: Supine   Flexion  AROM;10 reps      Shoulder Exercises: Sidelying   External Rotation  AROM;Right;10 reps    ABduction  AROM;Right;10 reps      Shoulder Exercises: Pulleys   Flexion  2 minutes    Flexion Limitations  much improved today     ABduction  2 minutes      Shoulder Exercises: Therapy Ball   Flexion  10 reps;Right      Manual Therapy   Manual therapy comments  PROM to tolerance in supine                   PT Long Term Goals - 10/18/17  [place] #1   Title  Patient will demonstrate she has returned to baseline related to shoulder ROM and function.    Time  4    Period  Weeks    Status  On-going      PT LONG TERM GOAL #2   Title  Patient will increase right shoulder active flexion ROM to >/= 120 degrees for increased function and ease reaching overhead.    Baseline  67 degrees    Time  4    Period  Weeks    Status  New      PT LONG TERM GOAL #3   Title  Patient will increase right shoulder active abduction ROM to >/= 120 degrees for increased function and ease reaching overhead.    Baseline  42 degrees    Time  4    Period  Weeks    Status  New      PT LONG TERM GOAL #4   Title  Patient will report she has returned to >/= 50% of household tasks.    Time  4    Period  Weeks    Status  New      PT LONG TERM GOAL #5   Title  Patient will verbalize understanding of risk reduction practices for lymphedema.    Time  4    Period  Weeks    Status  New      Additional Long  Term Goals   Additional Long Term Goals  Yes      PT LONG TERM GOAL #6   Title  Patient will reduce DASH score to </= 10 to increased overall upper extremity function.    Baseline  31    Time  4    Period  Weeks    Status  New      Breast Clinic Goals - 07/07/17 1958      Patient will be able to verbalize understanding of pertinent lymphedema risk reduction practices relevant to her diagnosis specifically related to skin care.   Time  1    Period  Days    Status  Achieved      Patient will be able to return demonstrate and/or verbalize understanding of the post-op home exercise program related to regaining shoulder range of motion.   Time  1    Period  Days    Status  Achieved      Patient will be able to verbalize understanding of the importance of attending the postoperative After Breast Cancer Class for further lymphedema risk reduction education and therapeutic exercise.   Time  1    Period  Days    Status  Achieved           Plan - 11/01/17 1340    Clinical Impression Statement  Pts shoulder much improved today flexion and abduction by almost 30deg.  Cording is visible but not as restricting today with pt able to hold arm straighter    PT Treatment/Interventions  ADLs/Self Care Home Management;Therapeutic exercise;Patient/family education;Therapeutic activities;Scar mobilization;Manual lymph drainage;Passive range of motion    PT Next Visit Plan  udpdate AROM HEP, Pulleys, PROM right shoulder, AA/AROM exercises (not supposed to do much strengthening per surgeon conversation with Inez Catalina)    PT Home Exercise Plan  Access Code: [email or web address]; post op shoulder ROM, cane flexion and abduction standing and supine cane flexion       Patient will benefit from skilled therapeutic intervention  in order to improve the following deficits and impairments:  Decreased knowledge of precautions, Impaired UE functional use, Decreased range of motion, Postural dysfunction, Pain, Decreased scar  mobility, Increased fascial restricitons  Visit Diagnosis: Malignant neoplasm of lower-inner quadrant of right breast of female, estrogen receptor positive ([place])  Aftercare following surgery for neoplasm  Stiffness of right shoulder, not elsewhere classified  Pain in right arm  Abnormal posture     Problem List Patient Active Problem List   Diagnosis Date Noted  . Genetic testing 07/23/2017  . Family history of breast cancer 07/12/2017  . Family history of prostate cancer 07/12/2017  . Malignant neoplasm of lower-inner quadrant of right breast of female, estrogen receptor positive ([place]) 07/02/2017  . DEPRESSION 12/31/2006  . CARPAL TUNNEL SYNDROME 12/31/2006  . ARTHRALGIA, TMJ 12/31/2006  . ROSACEA 12/31/2006  . ARTHRALGIA 12/31/2006  . FIBROMYALGIA 12/31/2006  . SLEEP APNEA 12/31/2006    Shan Levans, PT 11/01/2017, 1:42 PM  [place], [place], [address] Phone: [phone]   Fax:  [phone]  Name: Lauren Holt MRN: [id number] Date of Birth: 11-24-1940

## 2017-11-01 NOTE — Progress Notes (Addendum)
PCP: L. Donnie Coffin, MD  Cardiologist: pt denies  EKG: 09/21/17 in EPIC  Stress test: denies  ECHO: pt denies   Cardiac Cath: pt denies  Chest x-ray: denies past year, no recent respiratory infections/complications

## 2017-11-01 NOTE — Pre-Procedure Instructions (Addendum)
Lauren Holt  11/01/2017      [place] [address], [place] [address] [place] [address] Phone: [phone] Fax: [phone]    Your procedure is scheduled on November 08, 2017.  Report to [facility] Admitting at 1245 PM.  Call this number if you have problems the morning of surgery:  [phone]   Remember:  Do not eat after midnight.   You may drink clear liquids until 1145 AM (3 hours prior to your scheduled procedure).  Clear liquids allowed are:                    Water, Juice (non-citric and without pulp), Carbonated beverages, Clear Tea, Black Coffee only, Plain Jell-O only, Gatorade and Plain Popsicles only -NO MILK PRODUCTS    Take these medicines the morning of surgery with A SIP OF WATER  Alprazolam (xanax)-if needed Anastrozole (arimidex) Citalopram (celexa) Doxycycline (vibramycin) Hydrocodone-acetaminophen (Norco)-if needed Methocarbamol (robaxin)-if needed   Follow your surgeon's instructions on when to hold/resume aspirin.  If no instructions were given call the office to determine how they would like to you take aspirin  7 days prior to surgery STOP taking any Aleve, Naproxen, Ibuprofen, Motrin, Advil, Goody's, BC's, all herbal medications, fish oil, and all vitamins    Do not wear jewelry, make-up or nail polish.  Do not wear lotions, powders, or perfumes, or deodorant.  Do not shave 48 hours prior to surgery.   Do not bring valuables to the hospital.  [facility] is not responsible for any belongings or valuables.  Contacts, dentures or bridgework may not be worn into surgery.  Leave your suitcase in the car.  After surgery it may be brought to your room.  For patients admitted to the hospital, discharge time will be determined by your treatment team.  Patients discharged the day of surgery will not be allowed to drive home.    [place] [facility]- Preparing For Surgery  Before surgery,  you can play an important role. Because skin is not sterile, your skin needs to be as free of germs as possible. You can reduce the number of germs on your skin by washing with CHG (chlorahexidine gluconate) Soap before surgery.  CHG is an antiseptic cleaner which kills germs and bonds with the skin to continue killing germs even after washing.    Oral Hygiene is also important to reduce your risk of infection.  Remember - BRUSH YOUR TEETH THE MORNING OF SURGERY WITH YOUR REGULAR TOOTHPASTE  Please do not use if you have an allergy to CHG or antibacterial soaps. If your skin becomes reddened/irritated stop using the CHG.  Do not shave (including legs and underarms) for at least 48 hours prior to first CHG shower. It is OK to shave your face.  Please follow these instructions carefully.   1. Shower the NIGHT BEFORE SURGERY and the MORNING OF SURGERY with CHG.   2. If you chose to wash your hair, wash your hair first as usual with your normal shampoo.  3. After you shampoo, rinse your hair and body thoroughly to remove the shampoo.  4. Use CHG as you would any other liquid soap. You can apply CHG directly to the skin and wash gently with a scrungie or a clean washcloth.   5. Apply the CHG Soap to your body ONLY FROM THE NECK DOWN.  Do not use on open wounds or open sores. Avoid contact with your eyes,  ears, mouth and genitals (private parts). Wash Face and genitals (private parts)  with your normal soap.  6. Wash thoroughly, paying special attention to the area where your surgery will be performed.  7. Thoroughly rinse your body with warm water from the neck down.  8. DO NOT shower/wash with your normal soap after using and rinsing off the CHG Soap.  9. Pat yourself dry with a CLEAN TOWEL.  10. Wear CLEAN PAJAMAS to bed the night before surgery, wear comfortable clothes the morning of surgery  11. Place CLEAN SHEETS on your bed the night of your first shower and DO NOT SLEEP WITH  PETS.  Day of Surgery:  Do not apply any deodorants/lotions.  Please wear clean clothes to the hospital/surgery center.   Remember to brush your teeth WITH YOUR REGULAR TOOTHPASTE.  Please read over the following fact sheets that you were given. Pain Booklet, Coughing and Deep Breathing and Surgical Site Infection Prevention

## 2017-11-02 ENCOUNTER — Encounter (HOSPITAL_COMMUNITY): Payer: Self-pay

## 2017-11-02 ENCOUNTER — Encounter (HOSPITAL_COMMUNITY)
Admission: RE | Admit: 2017-11-02 | Discharge: 2017-11-02 | Disposition: A | Discharge status: Home / Self Care | Payer: Medicare HMO | Source: Ambulatory Visit | Attending: General Surgery | Admitting: General Surgery

## 2017-11-02 ENCOUNTER — Other Ambulatory Visit: Payer: Self-pay

## 2017-11-02 DIAGNOSIS — C50911 Malignant neoplasm of unspecified site of right female breast: Secondary | ICD-10-CM | POA: Insufficient documentation

## 2017-11-02 DIAGNOSIS — F419 Anxiety disorder, unspecified: Secondary | ICD-10-CM | POA: Diagnosis not present

## 2017-11-02 DIAGNOSIS — L719 Rosacea, unspecified: Secondary | ICD-10-CM | POA: Diagnosis not present

## 2017-11-02 DIAGNOSIS — M8580 Other specified disorders of bone density and structure, unspecified site: Secondary | ICD-10-CM | POA: Diagnosis not present

## 2017-11-02 DIAGNOSIS — Z01812 Encounter for preprocedural laboratory examination: Secondary | ICD-10-CM | POA: Insufficient documentation

## 2017-11-02 DIAGNOSIS — Z0000 Encounter for general adult medical examination without abnormal findings: Secondary | ICD-10-CM | POA: Diagnosis not present

## 2017-11-02 DIAGNOSIS — E7800 Pure hypercholesterolemia, unspecified: Secondary | ICD-10-CM | POA: Diagnosis not present

## 2017-11-02 LAB — CBC
HEMATOCRIT: 44.1 % (ref 36.0–46.0)
HEMOGLOBIN: 14 g/dL (ref 12.0–15.0)
MCH: 31.3 pg (ref 26.0–34.0)
MCHC: 31.7 g/dL (ref 30.0–36.0)
MCV: 98.7 fL (ref 78.0–100.0)
Platelets: 319 10*3/uL (ref 150–400)
RBC: 4.47 MIL/uL (ref 3.87–5.11)
RDW: 12.3 % (ref 11.5–15.5)
WBC: 5.2 10*3/uL (ref 4.0–10.5)

## 2017-11-02 LAB — BASIC METABOLIC PANEL
ANION GAP: 10 (ref 5–15)
BUN: 11 mg/dL (ref 8–23)
CALCIUM: 9.3 mg/dL (ref 8.9–10.3)
CO2: 27 mmol/L (ref 22–32)
Chloride: 101 mmol/L (ref 98–111)
Creatinine, Ser: 0.62 mg/dL (ref 0.44–1.00)
GFR calc Af Amer: >60 mL/min (ref >60)
GFR calc non Af Amer: >60 mL/min (ref >60)
GLUCOSE: 100 mg/dL — AB (ref 70–99)
POTASSIUM: 3.8 mmol/L (ref 3.5–5.1)
Sodium: 138 mmol/L (ref 135–145)

## 2017-11-03 ENCOUNTER — Ambulatory Visit: Payer: Medicare HMO | Admitting: Rehabilitation

## 2017-11-03 ENCOUNTER — Other Ambulatory Visit: Payer: Self-pay | Admitting: Oncology

## 2017-11-08 ENCOUNTER — Ambulatory Visit (HOSPITAL_COMMUNITY): Payer: Medicare HMO | Admitting: Certified Registered"

## 2017-11-08 ENCOUNTER — Encounter (HOSPITAL_COMMUNITY): Payer: Self-pay | Admitting: *Deleted

## 2017-11-08 ENCOUNTER — Encounter (HOSPITAL_COMMUNITY)
Admission: RE | Disposition: A | Discharge status: Home / Self Care | Payer: Self-pay | Source: Ambulatory Visit | Attending: General Surgery

## 2017-11-08 ENCOUNTER — Ambulatory Visit: Payer: Medicare HMO

## 2017-11-08 ENCOUNTER — Other Ambulatory Visit: Payer: Self-pay | Admitting: *Deleted

## 2017-11-08 ENCOUNTER — Ambulatory Visit (HOSPITAL_COMMUNITY): Payer: Medicare HMO

## 2017-11-08 ENCOUNTER — Ambulatory Visit (HOSPITAL_COMMUNITY)
Admission: RE | Admit: 2017-11-08 | Discharge: 2017-11-08 | Disposition: A | Discharge status: Home / Self Care | Payer: Medicare HMO | Source: Ambulatory Visit | Attending: General Surgery | Admitting: General Surgery

## 2017-11-08 DIAGNOSIS — C50311 Malignant neoplasm of lower-inner quadrant of right female breast: Secondary | ICD-10-CM

## 2017-11-08 DIAGNOSIS — Z79899 Other long term (current) drug therapy: Secondary | ICD-10-CM | POA: Diagnosis not present

## 2017-11-08 DIAGNOSIS — F419 Anxiety disorder, unspecified: Secondary | ICD-10-CM | POA: Diagnosis not present

## 2017-11-08 DIAGNOSIS — F329 Major depressive disorder, single episode, unspecified: Secondary | ICD-10-CM | POA: Insufficient documentation

## 2017-11-08 DIAGNOSIS — Z170 Estrogen receptor positive status [ER+]: Principal | ICD-10-CM

## 2017-11-08 DIAGNOSIS — Z95828 Presence of other vascular implants and grafts: Secondary | ICD-10-CM

## 2017-11-08 DIAGNOSIS — M25611 Stiffness of right shoulder, not elsewhere classified: Secondary | ICD-10-CM

## 2017-11-08 DIAGNOSIS — Z452 Encounter for adjustment and management of vascular access device: Secondary | ICD-10-CM | POA: Diagnosis not present

## 2017-11-08 DIAGNOSIS — G4730 Sleep apnea, unspecified: Secondary | ICD-10-CM | POA: Diagnosis not present

## 2017-11-08 DIAGNOSIS — Z87891 Personal history of nicotine dependence: Secondary | ICD-10-CM | POA: Diagnosis not present

## 2017-11-08 DIAGNOSIS — M79601 Pain in right arm: Secondary | ICD-10-CM

## 2017-11-08 DIAGNOSIS — Z483 Aftercare following surgery for neoplasm: Secondary | ICD-10-CM

## 2017-11-08 DIAGNOSIS — Z9011 Acquired absence of right breast and nipple: Secondary | ICD-10-CM | POA: Insufficient documentation

## 2017-11-08 DIAGNOSIS — Z7982 Long term (current) use of aspirin: Secondary | ICD-10-CM | POA: Insufficient documentation

## 2017-11-08 DIAGNOSIS — R293 Abnormal posture: Secondary | ICD-10-CM

## 2017-11-08 DIAGNOSIS — Z419 Encounter for procedure for purposes other than remedying health state, unspecified: Secondary | ICD-10-CM

## 2017-11-08 DIAGNOSIS — G5600 Carpal tunnel syndrome, unspecified upper limb: Secondary | ICD-10-CM | POA: Diagnosis not present

## 2017-11-08 HISTORY — PX: PORTACATH PLACEMENT: SHX2246

## 2017-11-08 SURGERY — INSERTION, TUNNELED CENTRAL VENOUS DEVICE, WITH PORT
Anesthesia: General | Site: Chest | Laterality: Left

## 2017-11-08 MED ORDER — ONDANSETRON HCL 4 MG/2ML IJ SOLN
INTRAMUSCULAR | Status: DC | PRN
  Administered 2017-11-08: 4 mg via INTRAVENOUS

## 2017-11-08 MED ORDER — FENTANYL CITRATE (PF) 100 MCG/2ML IJ SOLN
INTRAMUSCULAR | Status: AC
  Filled 2017-11-08: qty 2

## 2017-11-08 MED ORDER — EPHEDRINE SULFATE 50 MG/ML IJ SOLN
INTRAMUSCULAR | Status: AC
  Filled 2017-11-08: qty 1

## 2017-11-08 MED ORDER — BUPIVACAINE HCL (PF) 0.25 % IJ SOLN
INTRAMUSCULAR | Status: DC | PRN
  Administered 2017-11-08: 8 mL

## 2017-11-08 MED ORDER — LIDOCAINE 2% (20 MG/ML) 5 ML SYRINGE
INTRAMUSCULAR | Status: AC
  Filled 2017-11-08: qty 5

## 2017-11-08 MED ORDER — GABAPENTIN 300 MG PO CAPS
300.0000 mg | ORAL_CAPSULE | ORAL | Status: AC
  Administered 2017-11-08: 300 mg via ORAL
  Filled 2017-11-08: qty 1

## 2017-11-08 MED ORDER — DEXAMETHASONE SODIUM PHOSPHATE 10 MG/ML IJ SOLN
INTRAMUSCULAR | Status: AC
  Filled 2017-11-08: qty 1

## 2017-11-08 MED ORDER — HEPARIN SOD (PORK) LOCK FLUSH 100 UNIT/ML IV SOLN
INTRAVENOUS | Status: DC | PRN
  Administered 2017-11-08: 500 [IU] via INTRAVENOUS

## 2017-11-08 MED ORDER — CELECOXIB 200 MG PO CAPS
200.0000 mg | ORAL_CAPSULE | ORAL | Status: AC
  Administered 2017-11-08: 200 mg via ORAL
  Filled 2017-11-08: qty 1

## 2017-11-08 MED ORDER — PROPOFOL 10 MG/ML IV BOLUS
INTRAVENOUS | Status: AC
  Filled 2017-11-08: qty 20

## 2017-11-08 MED ORDER — HYDROCODONE-ACETAMINOPHEN 5-325 MG PO TABS: 1.0000 | ORAL_TABLET | Freq: Four times a day (QID) | ORAL | 0 refills | Status: DC | PRN

## 2017-11-08 MED ORDER — DEXAMETHASONE SODIUM PHOSPHATE 4 MG/ML IJ SOLN
INTRAMUSCULAR | Status: DC | PRN
  Administered 2017-11-08: 8 mg via INTRAVENOUS

## 2017-11-08 MED ORDER — LACTATED RINGERS IV SOLN
INTRAVENOUS | Status: DC | PRN
  Administered 2017-11-08 (×2): via INTRAVENOUS

## 2017-11-08 MED ORDER — ACETAMINOPHEN 500 MG PO TABS
1000.0000 mg | ORAL_TABLET | ORAL | Status: AC
  Administered 2017-11-08: 1000 mg via ORAL
  Filled 2017-11-08: qty 2

## 2017-11-08 MED ORDER — SODIUM CHLORIDE 0.9 % IV SOLN
INTRAVENOUS | Status: DC | PRN
  Administered 2017-11-08: 15:00:00

## 2017-11-08 MED ORDER — LIDOCAINE 2% (20 MG/ML) 5 ML SYRINGE
INTRAMUSCULAR | Status: DC | PRN
  Administered 2017-11-08: 60 mg via INTRAVENOUS

## 2017-11-08 MED ORDER — PHENYLEPHRINE 40 MCG/ML (10ML) SYRINGE FOR IV PUSH (FOR BLOOD PRESSURE SUPPORT)
PREFILLED_SYRINGE | INTRAVENOUS | Status: AC
  Filled 2017-11-08: qty 10

## 2017-11-08 MED ORDER — FENTANYL CITRATE (PF) 100 MCG/2ML IJ SOLN
INTRAMUSCULAR | Status: DC | PRN
  Administered 2017-11-08: 50 ug via INTRAVENOUS

## 2017-11-08 MED ORDER — SODIUM CHLORIDE 0.9 % IV SOLN
INTRAVENOUS | Status: AC
  Filled 2017-11-08: qty 1.2

## 2017-11-08 MED ORDER — CHLORHEXIDINE GLUCONATE CLOTH 2 % EX PADS: 6.0000 | MEDICATED_PAD | Freq: Once | CUTANEOUS | Status: DC

## 2017-11-08 MED ORDER — BUPIVACAINE HCL (PF) 0.25 % IJ SOLN
INTRAMUSCULAR | Status: AC
  Filled 2017-11-08: qty 30

## 2017-11-08 MED ORDER — PROPOFOL 10 MG/ML IV BOLUS
INTRAVENOUS | Status: DC | PRN
  Administered 2017-11-08: 110 mg via INTRAVENOUS
  Administered 2017-11-08: 50 mg via INTRAVENOUS

## 2017-11-08 MED ORDER — 0.9 % SODIUM CHLORIDE (POUR BTL) OPTIME
TOPICAL | Status: DC | PRN
  Administered 2017-11-08: 1000 mL

## 2017-11-08 MED ORDER — ONDANSETRON HCL 4 MG/2ML IJ SOLN
INTRAMUSCULAR | Status: AC
  Filled 2017-11-08: qty 2

## 2017-11-08 MED ORDER — HEPARIN SOD (PORK) LOCK FLUSH 100 UNIT/ML IV SOLN
INTRAVENOUS | Status: AC
  Filled 2017-11-08: qty 5

## 2017-11-08 MED ORDER — CEFAZOLIN SODIUM-DEXTROSE 2-4 GM/100ML-% IV SOLN
2.0000 g | INTRAVENOUS | Status: AC
  Administered 2017-11-08: 2 g via INTRAVENOUS
  Filled 2017-11-08: qty 100

## 2017-11-08 MED ORDER — FENTANYL CITRATE (PF) 250 MCG/5ML IJ SOLN
INTRAMUSCULAR | Status: AC
  Filled 2017-11-08: qty 5

## 2017-11-08 MED ORDER — EPHEDRINE SULFATE 50 MG/ML IJ SOLN
INTRAMUSCULAR | Status: DC | PRN
  Administered 2017-11-08 (×2): 5 mg via INTRAVENOUS

## 2017-11-08 MED ORDER — PHENYLEPHRINE 40 MCG/ML (10ML) SYRINGE FOR IV PUSH (FOR BLOOD PRESSURE SUPPORT)
PREFILLED_SYRINGE | INTRAVENOUS | Status: DC | PRN
  Administered 2017-11-08 (×4): 80 ug via INTRAVENOUS

## 2017-11-08 MED ORDER — FENTANYL CITRATE (PF) 100 MCG/2ML IJ SOLN
25.0000 ug | INTRAMUSCULAR | Status: DC | PRN
  Administered 2017-11-08: 50 ug via INTRAVENOUS

## 2017-11-08 SURGICAL SUPPLY — 40 items
ADH SKN CLS APL DERMABOND .7 (GAUZE/BANDAGES/DRESSINGS) ×1
BAG DECANTER FOR FLEXI CONT (MISCELLANEOUS) ×3 IMPLANT
CHLORAPREP W/TINT 10.5 ML (MISCELLANEOUS) ×3 IMPLANT
COVER SURGICAL LIGHT HANDLE (MISCELLANEOUS) ×3 IMPLANT
COVER TRANSDUCER ULTRASND GEL (DRAPE) IMPLANT
CRADLE DONUT ADULT HEAD (MISCELLANEOUS) ×3 IMPLANT
DERMABOND ADVANCED (GAUZE/BANDAGES/DRESSINGS) ×2
DERMABOND ADVANCED .7 DNX12 (GAUZE/BANDAGES/DRESSINGS) ×1 IMPLANT
DRAPE C-ARM 42X72 X-RAY (DRAPES) ×3 IMPLANT
DRAPE CHEST BREAST 15X10 FENES (DRAPES) ×3 IMPLANT
ELECT CAUTERY BLADE 6.4 (BLADE) ×3 IMPLANT
ELECT REM PT RETURN 9FT ADLT (ELECTROSURGICAL) ×3
ELECTRODE REM PT RTRN 9FT ADLT (ELECTROSURGICAL) ×1 IMPLANT
GAUZE SPONGE 4X4 16PLY XRAY LF (GAUZE/BANDAGES/DRESSINGS) ×3 IMPLANT
GEL ULTRASOUND 20GR AQUASONIC (MISCELLANEOUS) IMPLANT
GLOVE BIO SURGEON STRL SZ7.5 (GLOVE) ×3 IMPLANT
GOWN STRL REUS W/ TWL LRG LVL3 (GOWN DISPOSABLE) ×2 IMPLANT
GOWN STRL REUS W/TWL LRG LVL3 (GOWN DISPOSABLE) ×6
INTRODUCER COOK 11FR (CATHETERS) IMPLANT
KIT BASIN OR (CUSTOM PROCEDURE TRAY) ×3 IMPLANT
KIT PORT POWER 8FR ISP CVUE (Port) ×2 IMPLANT
KIT TURNOVER KIT B (KITS) ×3 IMPLANT
NEEDLE 22X1 1/2 (OR ONLY) (NEEDLE) IMPLANT
NS IRRIG 1000ML POUR BTL (IV SOLUTION) ×3 IMPLANT
PAD ARMBOARD 7.5X6 YLW CONV (MISCELLANEOUS) ×3 IMPLANT
PENCIL BUTTON HOLSTER BLD 10FT (ELECTRODE) ×3 IMPLANT
SET INTRODUCER 12FR PACEMAKER (INTRODUCER) IMPLANT
SET SHEATH INTRODUCER 10FR (MISCELLANEOUS) IMPLANT
SHEATH COOK PEEL AWAY SET 9F (SHEATH) IMPLANT
SUT MNCRL AB 4-0 PS2 18 (SUTURE) ×3 IMPLANT
SUT PROLENE 2 0 SH 30 (SUTURE) ×3 IMPLANT
SUT SILK 2 0 (SUTURE)
SUT SILK 2-0 18XBRD TIE 12 (SUTURE) IMPLANT
SUT VIC AB 3-0 SH 27 (SUTURE) ×3
SUT VIC AB 3-0 SH 27XBRD (SUTURE) ×1 IMPLANT
SYR 10ML LL (SYRINGE) IMPLANT
SYR 5ML LUER SLIP (SYRINGE) ×3 IMPLANT
TOWEL OR 17X24 6PK STRL BLUE (TOWEL DISPOSABLE) ×3 IMPLANT
TOWEL OR 17X26 10 PK STRL BLUE (TOWEL DISPOSABLE) ×3 IMPLANT
TRAY LAPAROSCOPIC MC (CUSTOM PROCEDURE TRAY) ×3 IMPLANT

## 2017-11-08 NOTE — Op Note (Signed)
11/08/2017  3:22 PM  PATIENT:  Lauren Holt  77 y.o. female  PRE-OPERATIVE DIAGNOSIS:  RIGHT BREAST CANCER  POST-OPERATIVE DIAGNOSIS:  RIGHT BREAST CANCER  PROCEDURE:  Procedure(s): INSERTION PORT-A-CATH (Left)  SURGEON:  Surgeon(s) and Role:    * Jovita Kussmaul, MD - Primary  PHYSICIAN ASSISTANT:   ASSISTANTS: none   ANESTHESIA:   local and general  EBL:  minimal   BLOOD ADMINISTERED:none  DRAINS: none   LOCAL MEDICATIONS USED:  MARCAINE     SPECIMEN:  No Specimen  DISPOSITION OF SPECIMEN:  N/A  COUNTS:  YES  TOURNIQUET:  * No tourniquets in log *  DICTATION: .Dragon Dictation   After informed consent was obtained the patient was brought to the operating room and placed in the supine position on the operating table.  After adequate induction of general anesthesia a roll was placed between the patient's shoulder blades to extend the shoulder slightly.  The left chest and neck area were then prepped with ChloraPrep, allowed to dry, and draped in usual sterile manner.  An appropriate timeout was performed.  The patient was placed in Trendelenburg position.  The area lateral to the bend of the clavicle on the left chest wall was infiltrated with quarter percent Marcaine.  A large bore needle from the Port-A-Cath kit was used to slide beneath the bend of the clavicle heading towards the sternal notch and in doing so I was able to access the left subclavian vein without difficulty.  A wire was fed through the needle using the Seldinger technique without difficulty.  The wire was confirmed in the central venous system using real-time fluoroscopy.  Next a small incision was made at the wire entry site with a 15 blade knife.  The incision was carried through the skin and subcutaneous tissue sharply with electrocautery.  A subcutaneous pocket was then created inferior to the incision by blunt finger dissection.  Next the tubing was placed on the reservoir.  The reservoir was placed  in the pocket and the length of the tubing was estimated using real-time fluoroscopy.  The tubing was then cut to the appropriate length.  Next a sheath and dilator were fed over the wire also using Seldinger technique without difficulty.  The dilator and wire were then removed from the patient.  The tubing was fed through the sheath as far as it would go and then held in place while the sheath was gently cracked and separated.  Another real-time fluoroscopy image showed the tip of the catheter to be in the distal superior vena cava.  The tubing was then permanently anchored to the reservoir.  The reservoir was anchored in the pocket with two 2-0 Prolene stitches.  The port was then aspirated and it aspirated blood easily.  The port was then flushed initially with a dilute heparin solution and then with a more concentrated heparin solution.  The subcutaneous tissue was then closed over the port with interrupted 3-0 Vicryl stitches.  The skin was closed with a running 4-0 Monocryl subcuticular stitch.  Dermabond dressings were applied.  The patient tolerated the procedure well.  At the end of the case all needle sponge and instrument counts were correct.  The patient was then awakened and taken to recovery in stable condition.  PLAN OF CARE: Discharge to home after PACU  PATIENT DISPOSITION:  PACU - hemodynamically stable.   Delay start of Pharmacological VTE agent (>24hrs) due to surgical blood loss or risk of bleeding: not applicable

## 2017-11-08 NOTE — Patient Instructions (Signed)
3 Way Raises:      Starting Position:  Leaning against wall, walk feet a few inches away from the wall and make tummy tight (tuck hips underneath you) Press back/shoulders/head against wall as much as possible. Keep thumbs up to ceiling, elbows straight and shoulders relaxed/down throughout.  1. Lift arms in front to shoulder height 2. Lift arms a little wider into a "V" to shoulder height 3. Lift arms out to sides in a "T" to shoulder height  No weights until cleared by surgeon, then start light!! (1 lb/can of veggies)  Perform 10 times in each direction. Hold 1-2 lbs to start with and work up to 2-3 sets of 10/day. Perform 3-4 times/week. Increase weight as able, decreasing sets of 10 each time you increase weights, then slowly working your way back up to 2-3 sets each time.    Cancer Rehab [phone]

## 2017-11-08 NOTE — Anesthesia Preprocedure Evaluation (Addendum)
Anesthesia Evaluation  Patient identified by MRN, date of birth, ID band Patient awake    Reviewed: Allergy & Precautions, H&P , NPO status , Patient's Chart, lab work & pertinent test results  Airway Mallampati: II  TM Distance: >3 FB Neck ROM: Full    Dental no notable dental hx. (+) Teeth Intact, Dental Advisory Given   Pulmonary neg pulmonary ROS, former smoker,    Pulmonary exam normal breath sounds clear to auscultation       Cardiovascular negative cardio ROS   Rhythm:Regular Rate:Normal     Neuro/Psych  Headaches, Anxiety Depression    GI/Hepatic negative GI ROS, Neg liver ROS,   Endo/Other  negative endocrine ROS  Renal/GU negative Renal ROS  negative genitourinary   Musculoskeletal   Abdominal   Peds  Hematology negative hematology ROS (+)   Anesthesia Other Findings   Reproductive/Obstetrics negative OB ROS                            Anesthesia Physical Anesthesia Plan  ASA: II  Anesthesia Plan: General   Post-op Pain Management:    Induction: Intravenous  PONV Risk Score and Plan: 4 or greater and Ondansetron, Dexamethasone and Treatment may vary due to age or medical condition  Airway Management Planned: LMA  Additional Equipment:   Intra-op Plan:   Post-operative Plan: Extubation in OR  Informed Consent: I have reviewed the patients History and Physical, chart, labs and discussed the procedure including the risks, benefits and alternatives for the proposed anesthesia with the patient or authorized representative who has indicated his/her understanding and acceptance.   Dental advisory given  Plan Discussed with: CRNA  Anesthesia Plan Comments:         Anesthesia Quick Evaluation

## 2017-11-08 NOTE — Therapy (Addendum)
[place], [place], [address] Phone: [phone]   Fax:  [phone]  Physical Therapy Treatment  Patient Details  Name: Lauren Holt MRN: [id number] Date of Birth: 05/30/1940 Referring Provider: Dr. Autumn Messing   Encounter Date: 11/08/2017  PT End of Session - 11/08/17 1207    Visit Number  6    Number of Visits  10    Date for PT Re-Evaluation  11/15/17    PT Start Time  0939    PT Stop Time  1020    PT Time Calculation (min)  41 min    Activity Tolerance  Patient tolerated treatment well    Behavior During Therapy  [facility] for tasks assessed/performed       Past Medical History:  Diagnosis Date  . Anxiety   . Breast cancer, right breast ([place])   . Complication of anesthesia    difficult to wake - many years ago with tubal 50 years ago  . Dermatitis   . Family history of adverse reaction to anesthesia    "I think my sister was hard to wake up" (09/24/2017)  . Family history of breast cancer 07/12/2017  . Family history of prostate cancer 07/12/2017  . Genetic testing   . Palpitations    when she lays down, has not seen anyone for this, feels like a flutter    Past Surgical History:  Procedure Laterality Date  . BREAST BIOPSY Right 06/2017  . BREAST RECONSTRUCTION WITH PLACEMENT OF TISSUE EXPANDER AND ALLODERM Right 09/24/2017  . BREAST RECONSTRUCTION WITH PLACEMENT OF TISSUE EXPANDER AND ALLODERM Right 09/24/2017   Procedure: BREAST RECONSTRUCTION WITH PLACEMENT OF TISSUE EXPANDER AND ALLODERM;  Surgeon: Irene Limbo, MD;  Location: [facility];  Service: Plastics;  Laterality: Right;  . CARPAL TUNNEL RELEASE Bilateral   . COLONOSCOPY    . MASTECTOMY COMPLETE / SIMPLE W/ SENTINEL NODE BIOPSY Right 09/24/2017  . MASTECTOMY W/ SENTINEL NODE BIOPSY Right 09/24/2017   Procedure: MASTECTOMY WITH SENTINEL LYMPH NODE BIOPSY;  Surgeon: Jovita Kussmaul, MD;  Location: [facility];  Service: General;  Laterality:  Right;  . TUBAL LIGATION    . VAGINAL HYSTERECTOMY      There were no vitals filed for this visit.  Subjective Assessment - 11/08/17 0942    Subjective  I'm doing great and want to make today my last appointment. My doctor cleared me to go swimming so I got to do that this weekend. I feel like even doing my exercises that little bit this weekend in the water helped. Still haven't heard if I can do strengthening yet. I also get my port placed today. I just have alot going on1    Pertinent History  Right mastectomy [facility] 09/24/17.    Patient Stated Goals  Make sure my arm is ok    Currently in Pain?  No/denies         [facility] PT Assessment - 11/08/17 0001      AROM   Right Shoulder Flexion  133 Degrees    Right Shoulder ABduction  110 Degrees           Quick Dash - 11/08/17 0001    Open a tight or new jar  Moderate difficulty    Do heavy household chores (wash walls, wash floors)  No difficulty    Carry a shopping bag or briefcase  No difficulty    Wash your back  No difficulty    Use a knife to  cut food  No difficulty    Recreational activities in which you take some force or impact through your arm, shoulder, or hand (golf, hammering, tennis)  No difficulty    During the past week, to what extent has your arm, shoulder or hand problem interfered with your normal social activities with family, friends, neighbors, or groups?  Not at all    During the past week, to what extent has your arm, shoulder or hand problem limited your work or other regular daily activities  Not at all    Arm, shoulder, or hand pain.  Mild    Tingling (pins and needles) in your arm, shoulder, or hand  None    Difficulty Sleeping  Mild difficulty    DASH Score  9.09 %             [facility] Adult PT Treatment/Exercise - 11/08/17 0001      Shoulder Exercises: Standing   Other Standing Exercises  Back against wall with core engaged, and shoulders and head against wall for bil UE 3 way raises with no  weights, 2 x 10 with pt returning therapist demonstration      Shoulder Exercises: Pulleys   Flexion  2 minutes    ABduction  2 minutes    ABduction Limitations  VCs to decrease scapular compensation      Shoulder Exercises: Therapy Ball   Flexion  Both;10 reps Forward lean into end of stretch    ABduction  Right;10 reps Same side lean into end of stretch             PT Education - 11/08/17 1214    Education provided  Yes    Education Details  Standing 3 way raises with no weights (until cleared by surgeon)    Person(s) Educated  Patient    Methods  Explanation;Demonstration;Handout    Comprehension  Verbalized understanding;Returned demonstration          PT Long Term Goals - 11/08/17 1004      PT LONG TERM GOAL #1   Title  Patient will demonstrate she has returned to baseline related to shoulder ROM and function.    Baseline  Flexion goal met, good progress with abduction-11/08/17    Status  Partially Met      PT LONG TERM GOAL #2   Title  Patient will increase right shoulder active flexion ROM to >/= 120 degrees for increased function and ease reaching overhead.    Baseline  67 degrees; 133 degrees-11/08/17    Status  Achieved      PT LONG TERM GOAL #3   Title  Patient will increase right shoulder active abduction ROM to >/= 120 degrees for increased function and ease reaching overhead.    Baseline  42 degrees; 110 degrees-11/08/17    Status  Partially Met      PT LONG TERM GOAL #4   Title  Patient will report she has returned to >/= 50% of household tasks.    Baseline  100% return to ADLs-11/08/17    Status  Achieved      PT LONG TERM GOAL #5   Title  Patient will verbalize understanding of risk reduction practices for lymphedema.    Baseline  Educated pt in this today and issued handout, also suggested she come to ABC class which she was interested-11/08/17    Status  Achieved      PT LONG TERM GOAL #6   Title  Patient will reduce DASH score to </=  10 to  increased overall upper extremity function.    Baseline  31; 9.09 - 11/08/17    Status  Achieved         Plan - 11/08/17 1211    Clinical Impression Statement  Pts A/ROM has improved greatly and she has met all goals except abduction goal though she has greatly increased this since evaluation. Progressed her HEP to include 3 way raises without weights ut instructed her once she is cleared from surgeon she can continue this beginning with light weights. She reports feeling ready to D/C at this time.     Rehab Potential  Excellent    Clinical Impairments Affecting Rehab Potential  Pt having ahard time looking at her incisions and chest since surgery and is uneasy with moving her arm so she needs encouragement.    PT Duration  4 weeks    PT Treatment/Interventions  ADLs/Self Care Home Management;Therapeutic exercise;Patient/family education;Therapeutic activities;Scar mobilization;Manual lymph drainage;Passive range of motion    PT Next Visit Plan  D/C this visit.     Consulted and Agree with Plan of Care  Patient       Patient will benefit from skilled therapeutic intervention in order to improve the following deficits and impairments:  Decreased knowledge of precautions, Impaired UE functional use, Decreased range of motion, Postural dysfunction, Pain, Decreased scar mobility, Increased fascial restricitons  Visit Diagnosis: Malignant neoplasm of lower-inner quadrant of right breast of female, estrogen receptor positive ([place])  Aftercare following surgery for neoplasm  Stiffness of right shoulder, not elsewhere classified  Pain in right arm  Abnormal posture     Problem List Patient Active Problem List   Diagnosis Date Noted  . Genetic testing 07/23/2017  . Family history of breast cancer 07/12/2017  . Family history of prostate cancer 07/12/2017  . Malignant neoplasm of lower-inner quadrant of right breast of female, estrogen receptor positive ([place]) 07/02/2017  . DEPRESSION  12/31/2006  . CARPAL TUNNEL SYNDROME 12/31/2006  . ARTHRALGIA, TMJ 12/31/2006  . ROSACEA 12/31/2006  . ARTHRALGIA 12/31/2006  . FIBROMYALGIA 12/31/2006  . SLEEP APNEA 12/31/2006    Otelia Limes, PTA 11/08/2017, 12:16 PM  [place], [place], [address] Phone: [phone]   Fax:  [phone]  Name: Lauren Holt MRN: [id number] Date of Birth: Sep 05, 1940   PHYSICAL THERAPY DISCHARGE SUMMARY  Visits from Start of Care: 6  Current functional level related to goals / functional outcomes: See above   Remaining deficits: Some continued    Education / Equipment: HEP Plan: Patient agrees to discharge.  Patient goals were partially met. Patient is being discharged due to being pleased with the current functional level.  ?????      Shan Levans, PT

## 2017-11-08 NOTE — Interval H&P Note (Signed)
History and Physical Interval Note:  11/08/2017 2:10 PM  Lauren Holt  has presented today for surgery, with the diagnosis of RIGHT BREAST CANCER  The various methods of treatment have been discussed with the patient and family. After consideration of risks, benefits and other options for treatment, the patient has consented to  Procedure(s): INSERTION PORT-A-CATH (N/A) as a surgical intervention .  The patient's history has been reviewed, patient examined, no change in status, stable for surgery.  I have reviewed the patient's chart and labs.  Questions were answered to the patient's satisfaction.     TOTH III,Shaeleigh Graw S

## 2017-11-08 NOTE — Transfer of Care (Signed)
Immediate Anesthesia Transfer of Care Note  Patient: Lauren Holt  Procedure(s) Performed: INSERTION PORT-A-CATH (Left Chest)  Patient Location: PACU  Anesthesia Type:General  Level of Consciousness: awake and patient cooperative  Airway & Oxygen Therapy: Patient Spontanous Breathing and Patient connected to face mask oxygen  Post-op Assessment: Report given to RN  Post vital signs: Reviewed and stable  Last Vitals:  Vitals Value Taken Time  BP 105/64 11/08/2017  3:30 PM  Temp    Pulse 85 11/08/2017  3:31 PM  Resp 18 11/08/2017  3:31 PM  SpO2 99 % 11/08/2017  3:31 PM  Vitals shown include unvalidated device data.  Last Pain:  Vitals:   11/08/17 1254  TempSrc:   PainSc: 0-No pain         Complications: No apparent anesthesia complications

## 2017-11-08 NOTE — H&P (Signed)
Lauren Holt  Location: [facility] Patient #: [id number] DOB: 01-10-1941 Divorced / Language: [name] / Race: White Female   History of Present Illness  The patient is a 77 year old female who presents for a follow-up for Breast cancer. the patient is a 77 year old white female who is about 2 weeks status post right mastectomy and sentinel node mapping for a T1 CN 0 right breast cancer that was ER and PR positive and HER-2 negative with a Ki-67 of 5%. She tolerated the surgery well. She also had reconstruction by Dr. Iran Planas. She denies any significant pain. Her Oncotype score came back high risk. Because of this the oncologist would like her to have a Port-A-Cath for chemotherapy.   Allergies  No Known Drug Allergies  Allergies Reconciled   Medication History  PARoxetine HCl ('20MG'$  Tablet, Oral) Active. Citalopram Hydrobromide ('10MG'$  Tablet, Oral) Active. Aspirin ('325MG'$  Tablet DR, Oral) Active. Fish Oil ('500MG'$  Capsule, Oral) Active. Multiple Vitamin (1 (one) Oral) Active. Red Yeast Rice ('600MG'$  Tablet, Oral) Active. Vitamin C ('500MG'$  Capsule, Oral) Active. Vitamin D (1000UNIT Tablet, Oral) Active. Vitamin E (400UNIT Capsule, Oral) Active. Medications Reconciled    Review of Systems  General Not Present- Appetite Loss, Chills, Fatigue, Fever, Night Sweats, Weight Gain and Weight Loss. Note: All other systems negative (unless as noted in HPI & included Review of Systems) Skin Not Present- Change in Wart/Mole, Dryness, Hives, Jaundice, New Lesions, Non-Healing Wounds, Rash and Ulcer. HEENT Not Present- Earache, Hearing Loss, Hoarseness, Nose Bleed, Oral Ulcers, Ringing in the Ears, Seasonal Allergies, Sinus Pain, Sore Throat, Visual Disturbances, Wears glasses/contact lenses and Yellow Eyes. Respiratory Not Present- Bloody sputum, Chronic Cough, Difficulty Breathing, Snoring and Wheezing. Breast Not Present- Breast Mass, Breast Pain, Nipple Discharge  and Skin Changes. Cardiovascular Not Present- Chest Pain, Difficulty Breathing Lying Down, Leg Cramps, Palpitations, Rapid Heart Rate, Shortness of Breath and Swelling of Extremities. Gastrointestinal Not Present- Abdominal Pain, Bloating, Bloody Stool, Change in Bowel Habits, Chronic diarrhea, Constipation, Difficulty Swallowing, Excessive gas, Gets full quickly at meals, Hemorrhoids, Indigestion, Nausea, Rectal Pain and Vomiting. Female Genitourinary Not Present- Frequency, Nocturia, Painful Urination, Pelvic Pain and Urgency. Musculoskeletal Not Present- Back Pain, Joint Pain, Joint Stiffness, Muscle Pain, Muscle Weakness and Swelling of Extremities. Neurological Not Present- Decreased Memory, Fainting, Headaches, Numbness, Seizures, Tingling, Tremor, Trouble walking and Weakness. Psychiatric Not Present- Anxiety, Bipolar, Change in Sleep Pattern, Depression, Fearful and Frequent crying. Endocrine Not Present- Cold Intolerance, Excessive Hunger, Hair Changes, Heat Intolerance, Hot flashes and New Diabetes. Hematology Not Present- Easy Bruising, Excessive bleeding, Gland problems, HIV and Persistent Infections.  Vitals  Weight: 178.25 lb Height: 64in Body Surface Area: 1.86 m Body Mass Index: 30.6 kg/m  Temp.: 98.13F(Oral)  Pulse: 86 (Regular)  BP: 130/84 (Sitting, Left Arm, Standard)       Physical Exam  General Mental Status-Alert. General Appearance-Consistent with stated age. Hydration-Well hydrated. Voice-Normal.  Head and Neck Head-normocephalic, atraumatic with no lesions or palpable masses. Trachea-midline. Thyroid Gland Characteristics - normal size and consistency.  Eye Eyeball - Bilateral-Extraocular movements intact. Sclera/Conjunctiva - Bilateral-No scleral icterus.  Chest and Lung Exam Chest and lung exam reveals -quiet, even and easy respiratory effort with no use of accessory muscles and on auscultation, normal breath sounds,  no adventitious sounds and normal vocal resonance. Inspection Chest Wall - Normal. Back - normal.  Breast Note: the right breast wise pattern incision is healing nicely with no sign of infection or significant seroma. The skin flaps are healthy.   Cardiovascular  Cardiovascular examination reveals -normal heart sounds, regular rate and rhythm with no murmurs and normal pedal pulses bilaterally.  Abdomen Inspection Inspection of the abdomen reveals - No Hernias. Skin - Scar - no surgical scars. Palpation/Percussion Palpation and Percussion of the abdomen reveal - Soft, Non Tender, No Rebound tenderness, No Rigidity (guarding) and No hepatosplenomegaly. Auscultation Auscultation of the abdomen reveals - Bowel sounds normal.  Neurologic Neurologic evaluation reveals -alert and oriented x 3 with no impairment of recent or remote memory. Mental Status-Normal.  Musculoskeletal Normal Exam - Left-Upper Extremity Strength Normal and Lower Extremity Strength Normal. Normal Exam - Right-Upper Extremity Strength Normal and Lower Extremity Strength Normal.  Lymphatic Head & Neck  General Head & Neck Lymphatics: Bilateral - Description - Normal. Axillary  General Axillary Region: Bilateral - Description - Normal. Tenderness - Non Tender. Femoral & Inguinal  Generalized Femoral & Inguinal Lymphatics: Bilateral - Description - Normal. Tenderness - Non Tender.    Assessment & Plan ( MALIGNANT NEOPLASM OF LOWER-INNER QUADRANT OF RIGHT BREAST OF FEMALE, ESTROGEN RECEPTOR POSITIVE (C50.311) Impression: The patient is about 2 weeks status post right mastectomy for breast cancer with reconstruction. Her Oncotype came back as high risk. Because of this she will need a Port-A-Cath. I have discussed with her in detail the risks and benefits of the operation to place the port as well as some of the technical aspects including the risk of pneumothorax and she understands and wishes to  proceed Current Plans Follow up with [place] in the office in 3 MONTHS.  Call us sooner as needed.

## 2017-11-08 NOTE — Anesthesia Procedure Notes (Signed)
Procedure Name: LMA Insertion Date/Time: 11/08/2017 2:31 PM Performed by: Orlie Dakin, CRNA Pre-anesthesia Checklist: Patient identified, Emergency Drugs available, Suction available, Patient being monitored and Timeout performed Patient Re-evaluated:Patient Re-evaluated prior to induction Oxygen Delivery Method: Circle system utilized Preoxygenation: Pre-oxygenation with 100% oxygen Induction Type: IV induction Ventilation: Mask ventilation without difficulty LMA: LMA inserted LMA Size: 4.0 Tube type: Oral Number of attempts: 1 Placement Confirmation: positive ETCO2 Tube secured with: Tape Dental Injury: Teeth and Oropharynx as per pre-operative assessment

## 2017-11-08 NOTE — Anesthesia Postprocedure Evaluation (Signed)
Anesthesia Post Note  Patient: Kali Deadwyler  Procedure(s) Performed: INSERTION PORT-A-CATH (Left Chest)     Patient location during evaluation: PACU Anesthesia Type: General Level of consciousness: awake and alert Pain management: pain level controlled Vital Signs Assessment: post-procedure vital signs reviewed and stable Respiratory status: spontaneous breathing, nonlabored ventilation and respiratory function stable Cardiovascular status: blood pressure returned to baseline and stable Postop Assessment: no apparent nausea or vomiting Anesthetic complications: no    Last Vitals:  Vitals:   11/08/17 1615 11/08/17 1621  BP: 113/65 (!) 108/54  Pulse: 68 75  Resp: (!) 22 18  Temp: (!) 36.3 C   SpO2: 90% 97%    Last Pain:  Vitals:   11/08/17 1615  TempSrc:   PainSc: 1                  Tyrie Porzio,W. EDMOND

## 2017-11-09 ENCOUNTER — Encounter (HOSPITAL_COMMUNITY): Payer: Self-pay | Admitting: General Surgery

## 2017-11-09 ENCOUNTER — Inpatient Hospital Stay: Payer: Medicare HMO

## 2017-11-10 ENCOUNTER — Encounter: Payer: Self-pay | Admitting: Oncology

## 2017-11-10 NOTE — Progress Notes (Signed)
Called patient to introduce myself as Arboriculturist. Asked patient if she had any financial questions or concerns regarding treatment. Patient states she does ot think so. Asked patient if she knows if she has met her ded/OOP for the year for insurance or if she will be left with a coinsurance. She states she does not know. Advised patient PAF has funds available for her diagnosis for copay assistance if she needs to apply. Advised patient to contact her insurance company to find out if she has met everything and they are covering at 100% or if she will be left with a balance. Advised gross household income amount would be needed to apply as well. She verbalized understanding.  Discussed one-time $1000 Advertising account executive. Based on verbal income for a household of 1, patient is overqualified. Patient will call me back with specific information before her next appointment. She has my contact name and number for any additional financial questions or concerns.

## 2017-11-11 ENCOUNTER — Other Ambulatory Visit: Payer: Self-pay | Admitting: *Deleted

## 2017-11-11 ENCOUNTER — Telehealth: Payer: Self-pay | Admitting: *Deleted

## 2017-11-11 MED ORDER — LIDOCAINE-PRILOCAINE 2.5-2.5 % EX CREA: 1.0000 "application " | TOPICAL_CREAM | CUTANEOUS | 1 refills | Status: DC | PRN

## 2017-11-11 MED ORDER — PROCHLORPERAZINE MALEATE 10 MG PO TABS: 10.0000 mg | ORAL_TABLET | Freq: Four times a day (QID) | ORAL | 0 refills | Status: DC | PRN

## 2017-11-11 NOTE — Telephone Encounter (Signed)
This RN sent prescriptions for upcoming treatment on 8/7 to pharmacy for antinausea and EMLA cream.  Pt is requesting surgeon to leave port accessed post placement on 11/16/2017 for treatment scheduled 11/17/2017.  This note will be forwarded to Dr Marlou Starks - surgeon who is placing port.

## 2017-11-15 ENCOUNTER — Encounter: Payer: Medicare HMO | Admitting: Rehabilitation

## 2017-11-15 NOTE — Progress Notes (Signed)
[facility]  Telephone:([phone]) [phone] [phone]) [phone]     ID: Harryette Shuart DOB: [birth year]  MR#: [id number]  WUG#:[id number]  Patient Care Team: Alroy Dust, Carlean Jews.Marlou Sa, MD as PCP - General (Family Medicine) Jovita Kussmaul, MD as Consulting Physician (General Surgery) Mallika Sanmiguel, Virgie Dad, MD as Consulting Physician (Oncology) Kyung Rudd, MD as Consulting Physician (Radiation Oncology) OTHER MD:  CHIEF COMPLAINT: Estrogen receptor positive breast cancer  CURRENT TREATMENT: Adjuvant chemotherapy   HISTORY OF CURRENT ILLNESS: On the original intake note:  Ambur Holt had routine screening mammography on 06/14/2017 showing a possible area of calcifications in the right breast. She underwent unilateral right diagnostic mammography with tomography at [facility] on 06/23/2017 showing: Suspicious calcifications within the inferior right breast, spanning nearly 10 cm, more superior component spanning 4 cm extent. The more superior component may have a small associated mass. Ultrasonography of the right axilla on 07/01/2017 showed normal right axillary lymph nodes.   Accordingly on 06/29/2017 she proceeded to biopsy of the right breast area in question. The pathology from this procedure showed ([id number]): In the right breast lower central, more lateral superior: invasive ductal carcinoma grade I. In the lower central, more medial inferior: Ductal carcinoma in situ, high grade, with calcifications. The  invasive tumor was significant for estrogen receptor, 100% positive and progesterone receptor, 10% positive, both with strong staining intensity. Proliferation marker Ki67 at 5%. HER2  not amplified. The ductal carcinoma in situ was significant for estrogen receptor, 100% positive and progesterone receptor, 80% positive, both with strong staining intensity.   The patient's subsequent history 77 is as detailed below.  INTERVAL HISTORY: [name] returns today for follow up and  treatment of her estrogen receptor positive breast cancer. She begins adjuvant chemotherapy on 11/17/2017 with CMF given every 21 days for 6 cycles. Her port is sore and healing. She has been using cannabis creme, made by her cousin in [place].   She took an anastrozole pill last night, but this will be temporarily discontinued until she completes chemotherapy. She had mild arthralgias and myalgias while taking this.    REVIEW OF SYSTEMS: Lauren Holt reports that she is well educated since attending chemotherapy school. She plans to start walking for exercise. She also has a pool near her house, and she likes to swim. She denies unusual headaches, visual changes, nausea, vomiting, or dizziness. There has been no unusual cough, phlegm production, or pleurisy. This been no change in bowel or bladder habits. She denies unexplained fatigue or unexplained weight loss, bleeding, rash, or fever. A detailed review of systems was otherwise stable.    PAST MEDICAL HISTORY: Past Medical History:  Diagnosis Date  . Anxiety   . Breast cancer, right breast ([facility])   . Complication of anesthesia    difficult to wake - many years ago with tubal 50 years ago  . Dermatitis   . Family history of adverse reaction to anesthesia    "I think my sister was hard to wake up" (09/24/2017)  . Family history of breast cancer 07/12/2017  . Family history of prostate cancer 07/12/2017  . Genetic testing   . Palpitations    when she lays down, has not seen anyone for this, feels like a flutter   HLD ( but not too high)   PAST SURGICAL HISTORY: Past Surgical History:  Procedure Laterality Date  . BREAST BIOPSY Right 06/2017  . BREAST RECONSTRUCTION WITH PLACEMENT OF TISSUE EXPANDER AND ALLODERM Right 09/24/2017  . BREAST RECONSTRUCTION WITH PLACEMENT OF  TISSUE EXPANDER AND ALLODERM Right 09/24/2017   Procedure: BREAST RECONSTRUCTION WITH PLACEMENT OF TISSUE EXPANDER AND ALLODERM;  Surgeon: Irene Limbo, MD;   Location: [facility];  Service: Plastics;  Laterality: Right;  . CARPAL TUNNEL RELEASE Bilateral   . COLONOSCOPY    . MASTECTOMY COMPLETE / SIMPLE W/ SENTINEL NODE BIOPSY Right 09/24/2017  . MASTECTOMY W/ SENTINEL NODE BIOPSY Right 09/24/2017   Procedure: MASTECTOMY WITH SENTINEL LYMPH NODE BIOPSY;  Surgeon: Jovita Kussmaul, MD;  Location: [facility];  Service: General;  Laterality: Right;  . PORTACATH PLACEMENT Left 11/08/2017   Procedure: INSERTION PORT-A-CATH;  Surgeon: Jovita Kussmaul, MD;  Location: [facility];  Service: General;  Laterality: Left;  . TUBAL LIGATION    . VAGINAL HYSTERECTOMY     Partial Hysterectomy without BSO. Because they were too close to the bladder.    FAMILY HISTORY Family History  Problem Relation Age of Onset  . Breast cancer Paternal Aunt   . Breast cancer Cousin 67  . Prostate cancer Father 3  . Breast cancer Cousin 19  . Breast cancer Cousin 25  . Breast cancer Cousin 24  . Breast cancer Other    The patient's father died at age 62 due to emphysema. The patient's mother is alive at age 78 as of March 2019. The patient has 5 brothers and 3 sisters. There was a paternal great aunt with breast cancer. There was also a paternal cousin with breast cancer.   GYNECOLOGIC HISTORY:  No LMP recorded. Patient has had a hysterectomy. Menarche: 77 years old Age at first live birth: 77 years old The patient is GXP3. She is s/p partial hysterectomy. Both of her ovaries remain. She used oral contraceptives with no complications. She also used HRT for 2 years without complications.     SOCIAL HISTORY:  [name] is now retired. 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 She is divorced. Her oldest daughter, Lauren Holt", is a [employer] Land in [place], [place]. The patient's son, Lauren Holt, works in a Proofreader in [place]. The patient's son, Ronalee Holt, works in Quarry manager in [place].  The patient has 4 grandchildren. She currently does not  belong to a church.      ADVANCED DIRECTIVES: Not in place.  At the 07/07/2017 visit the patient was given the appropriate documents to complete and notarized at her discretion.  HEALTH MAINTENANCE: Social History   Tobacco Use  . Smoking status: Former Smoker    Years: 1.00    Types: Cigarettes  . Smokeless tobacco: Never Used  . Tobacco comment: Smoked X 1 year at age 41 (less than ppd)  Substance Use Topics  . Alcohol use: Not Currently  . Drug use: Never     Colonoscopy: 02/2017 at [facility]  PAP:   Bone density: 2018/ osteopenia   No Known Allergies  Current Outpatient Medications  Medication Sig Dispense Refill  . ALPRAZolam (XANAX) 0.25 MG tablet Take 0.125 mg by mouth 2 (two) times daily as needed for anxiety.     Marland Kitchen aspirin 325 MG tablet Take 325 mg by mouth daily.    Marland Kitchen BETA CAROTENE PO Take 7,500 mcg by mouth daily.    . calcium carbonate (CALCIUM 600) 600 MG TABS tablet Take 600 mg by mouth daily.    . Cholecalciferol (VITAMIN D-3) 5000 units TABS Take 1 tablet by mouth daily.    . citalopram (CELEXA) 10 MG tablet Take 10 mg by  mouth daily.  0  . doxycycline (VIBRAMYCIN) 50 MG capsule Take 1 capsule (50 mg total) by mouth 2 (two) times daily. 12 capsule 0  . lidocaine-prilocaine (EMLA) cream Apply 1 application topically as needed. 30 g 1  . Lutein 20 MG CAPS Take 20 mg by mouth daily.     . Magnesium Citrate 100 MG TABS Take 100 mg by mouth daily.     . methocarbamol (ROBAXIN) 500 MG tablet Take 1 tablet (500 mg total) by mouth every 8 (eight) hours as needed for muscle spasms. 30 tablet 0  . Multiple Vitamins-Minerals (ICAPS AREDS FORMULA PO) Take 1 capsule by mouth 3 (three) times a week.     . Probiotic Product (PROBIOTIC DAILY PO) Take 1 capsule by mouth 3 (three) times a week.     . prochlorperazine (COMPAZINE) 10 MG tablet Take 1 tablet (10 mg total) by mouth every 6 (six) hours as needed for nausea or vomiting. 30 tablet 0  . Red Yeast Rice 600 MG  CAPS Take 600 mg by mouth daily.     Marland Kitchen VITAMIN E PO Take 180 mg by mouth daily.    Marland Kitchen zinc gluconate 50 MG tablet Take 50 mg by mouth daily as needed (cold prevention).     No current facility-administered medications for this visit.     OBJECTIVE: Middle-aged white woman in no acute distress  Vitals:   11/16/17 1206  BP: (!) 124/56  Pulse: 87  Resp: 18  Temp: 99.7 F (37.6 C)  SpO2: 98%     Body mass index is 31.05 kg/m.   Wt Readings from Last 3 Encounters:  11/16/17 175 lb 4.8 oz (79.5 kg)  11/08/17 173 lb (78.5 kg)  11/02/17 176 lb 4.8 oz (80 kg)      ECOG FS:1 - Symptomatic but completely ambulatory  Sclerae unicteric, EOMs intact Oropharynx clear and moist No cervical or supraclavicular adenopathy Lungs no rales or rhonchi Heart regular rate and rhythm Abd soft, nontender, positive bowel sounds MSK no focal spinal tenderness, no upper extremity lymphedema Neuro: nonfocal, well oriented, appropriate affect Breasts: Status post right mastectomy.  Expander is in place.  There is no dehiscence or erythema.  Left breast is benign.  Both axillae are benign.   LAB RESULTS:  CMP     Component Value Date/Time   NA 138 11/02/2017 1322   K 3.8 11/02/2017 1322   CL 101 11/02/2017 1322   CO2 27 11/02/2017 1322   GLUCOSE 100 (H) 11/02/2017 1322   BUN 11 11/02/2017 1322   CREATININE 0.62 11/02/2017 1322   CREATININE 0.76 10/12/2017 1120   CALCIUM 9.3 11/02/2017 1322   PROT 7.1 10/12/2017 1120   ALBUMIN 3.8 10/12/2017 1120   AST 15 10/12/2017 1120   ALT 13 10/12/2017 1120   ALKPHOS 79 10/12/2017 1120   BILITOT 0.2 (L) 10/12/2017 1120   GFRNONAA >60 11/02/2017 1322   GFRNONAA >60 10/12/2017 1120   GFRAA >60 11/02/2017 1322   GFRAA >60 10/12/2017 1120    No results found for: TOTALPROTELP, ALBUMINELP, A1GS, A2GS, BETS, BETA2SER, GAMS, MSPIKE, SPEI  No results found for: KPAFRELGTCHN, LAMBDASER, KAPLAMBRATIO  Lab Results  Component Value Date   WBC 5.2  11/02/2017   NEUTROABS 4.4 10/12/2017   HGB 14.0 11/02/2017   HCT 44.1 11/02/2017   MCV 98.7 11/02/2017   PLT 319 11/02/2017    _0 @  No results found for: LABCA2  No components found for: [id number]  No results for input(s): INR  in the last 168 hours.  No results found for: LABCA2  No results found for: JJO841  No results found for: YSA630  No results found for: ZSW109  No results found for: CA2729  No components found for: HGQUANT  No results found for: CEA1 / No results found for: CEA1   No results found for: AFPTUMOR  No results found for: CHROMOGRNA  No results found for: PSA1  No visits with results within 3 Day(s) from this visit.  Latest known visit with results is:  Hospital Outpatient Visit on 11/02/2017  Component Date Value Ref Range Status  . Sodium 11/02/2017 138  135 - 145 mmol/L Final  . Potassium 11/02/2017 3.8  3.5 - 5.1 mmol/L Final  . Chloride 11/02/2017 101  98 - 111 mmol/L Final  . CO2 11/02/2017 27  22 - 32 mmol/L Final  . Glucose, Bld 11/02/2017 100* 70 - 99 mg/dL Final  . BUN 11/02/2017 11  8 - 23 mg/dL Final  . Creatinine, Ser 11/02/2017 0.62  0.44 - 1.00 mg/dL Final  . Calcium 11/02/2017 9.3  8.9 - 10.3 mg/dL Final  . GFR calc non Af Amer 11/02/2017 >60  >60 mL/min Final  . GFR calc Af Amer 11/02/2017 >60  >60 mL/min Final   Comment: (NOTE) The eGFR has been calculated using the CKD EPI equation. This calculation has not been validated in all clinical situations. eGFR's persistently <60 mL/min signify possible Chronic Kidney Disease.   Georgiann Hahn gap 11/02/2017 10  5 - 15 Final   Performed at [facility], [place] [address], [place], [place] [address]  . WBC 11/02/2017 5.2  4.0 - 10.5 K/uL Final  . RBC 11/02/2017 4.47  3.87 - 5.11 MIL/uL Final  . Hemoglobin 11/02/2017 14.0  12.0 - 15.0 g/dL Final  . HCT 11/02/2017 44.1  36.0 - 46.0 % Final  . MCV 11/02/2017 98.7  78.0 - 100.0 fL Final  . MCH 11/02/2017 31.3  26.0 -  34.0 pg Final  . MCHC 11/02/2017 31.7  30.0 - 36.0 g/dL Final  . RDW 11/02/2017 12.3  11.5 - 15.5 % Final  . Platelets 11/02/2017 319  150 - 400 K/uL Final   Performed at [facility], [place] [address], [place], [place] [address]    (this displays the last labs from the last 3 days)  No results found for: TOTALPROTELP, ALBUMINELP, A1GS, A2GS, BETS, BETA2SER, GAMS, MSPIKE, SPEI (this displays SPEP labs)  No results found for: KPAFRELGTCHN, LAMBDASER, KAPLAMBRATIO (kappa/lambda light chains)  No results found for: HGBA, HGBA2QUANT, HGBFQUANT, HGBSQUAN (Hemoglobinopathy evaluation)   No results found for: LDH  No results found for: IRON, TIBC, IRONPCTSAT (Iron and TIBC)  No results found for: FERRITIN  Urinalysis No results found for: COLORURINE, APPEARANCEUR, LABSPEC, PHURINE, GLUCOSEU, HGBUR, BILIRUBINUR, KETONESUR, PROTEINUR, UROBILINOGEN, NITRITE, LEUKOCYTESUR   STUDIES: Dg Chest Port 1 View  Result Date: 11/08/2017 CLINICAL DATA:  Initial evaluation for Port-A-Cath. EXAM: PORTABLE CHEST 1 VIEW COMPARISON:  None. FINDINGS: Left-sided subclavian approach Port-A-Cath in place with tip overlying the cavoatrial junction. Cardiac and mediastinal silhouettes within normal limits. Lungs normally inflated. No focal infiltrates. No pulmonary edema or pleural effusion. No pneumothorax. No acute osseous abnormality. Right breast tissue expander in place. Mild scoliosis noted. IMPRESSION: 1. Left subclavian approach central venous catheter in place with tip overlying the cavoatrial junction. 2. No other active cardiopulmonary disease. Electronically Signed   By: Jeannine Boga M.D.   On: 11/08/2017 16:00   Dg  Fluoro Guide Cv Line-no Report  Result Date: 11/08/2017 Fluoroscopy was utilized by the requesting physician.  No radiographic interpretation.    ELIGIBLE FOR AVAILABLE RESEARCH PROTOCOL: exact sciences study  ASSESSMENT: 77 y.o. [place], [place] woman status post right  breast biopsy 07/01/2017 for a clinical mT2 N0, stage IB invasive ductal carcinoma, grade 1, estrogen and progesterone receptor positive, HER-2 nonamplified, with an MIB-105%  (1) a second area in the right breast biopsied 07/01/2017 showed ductal carcinoma in situ, grade 3, estrogen and progesterone receptor positive.  (2) status post right mastectomy and sentinel lymph node sampling 09/24/2017 for a pT1b pN0, stage IA invasive ductal carcinoma, grade 1, with negative margins  (a) a total of 5 sentinel lymph nodes were removed  (3) The Oncotype DX score was 28, predicting a risk of outside the breast recurrence over the next 9 years of 17% if the patient's only systemic therapy is tamoxifen for 5 years.  It also predicts a significant benefit from chemotherapy.  (4) cyclophosphamide, methotrexate and fluorouracil (CMF) chemotherapy to start 11/17/2017, repeated every 21 days x 8  (5) anastrozole started 08/04/2016, interrupted during chemotherapy  (6) genetics testing 07/22/2017 through the Common Hereditary Cancer Panel offered by [facility] found no deleterious mutations in APC, ATM, AXIN2, BARD1, BMPR1A, BRCA1, BRCA2, BRIP1, CDH1, CDKN2A (p14ARF), CDKN2A (p16INK4a), CKD4, CHEK2, CTNNA1, DICER1, EPCAM (Deletion/duplication testing only), GREM1 (promoter region deletion/duplication testing only), KIT, MEN1, MLH1, MSH2, MSH3, MSH6, MUTYH, NBN, NF1, NHTL1, PALB2, PDGFRA, PMS2, POLD1, POLE, PTEN, RAD50, RAD51C, RAD51D, SDHB, SDHC, SDHD, SMAD4, SMARCA4. STK11, TP53, TSC1, TSC2, and VHL.  The following genes were evaluated for sequence changes only: SDHA and HOXB13 c.251G>A variant only.   (a) a variant of uncertain significance in the gene DICER1 was identified.  c.2116+6G>A (Intronic)   PLAN: Mariette did well with her port placement.  She had many questions regarding that.  She also did well with the chemotherapy teaching nurse.  She had many follow-up questions regarding that as well.  We again  reviewed the possible side effects complications and toxicities of her chemotherapy, which she will start tomorrow.  Some patients like to have the visit a labs one day in the chemo the next day.  This means they come twice but each visit is prefer.  This is a way she is doing at this time.  We will see how she wants to do it in the future.  Her insurance did not approve Neulasta so she will not receive at this time.  If her counts are very low we will go back to the insurance and argue for it.  She is receiving some herbal medications from a relative and we discussed those in detail as well.  We reviewed how to take Compazine, which in her case will be 3 times a day before meals for the first 3 days including chemo day.  She understands she does not have to take it before coming here for chemo since we will give her strong premeds  I am scheduling her to see me again 11/29/2017 to check her nadir counts and tolerance of cycle 1  I have urged her to call us with any problems that may develop before the next visit and particularly if the nausea is not well controlled on Compazine alone  Ashantia Amaral, Virgie Dad, MD  11/16/17 12:33 PM Medical Oncology and Hematology [facility] [place], [place] [address] Tel. [phone]    Fax. [phone]  Alice Rieger, am  acting as scribe for Chauncey Cruel MD.  I, Lurline Del MD, have reviewed the above documentation for accuracy and completeness, and I agree with the above.

## 2017-11-16 ENCOUNTER — Telehealth: Payer: Self-pay | Admitting: Oncology

## 2017-11-16 ENCOUNTER — Inpatient Hospital Stay: Payer: Medicare HMO | Attending: Oncology | Admitting: Oncology

## 2017-11-16 ENCOUNTER — Other Ambulatory Visit: Payer: Self-pay | Admitting: Oncology

## 2017-11-16 VITALS — BP 124/56 | HR 87 | Temp 99.7°F | Resp 18 | Ht 63.0 in | Wt 175.3 lb

## 2017-11-16 DIAGNOSIS — Z87891 Personal history of nicotine dependence: Secondary | ICD-10-CM

## 2017-11-16 DIAGNOSIS — Z5111 Encounter for antineoplastic chemotherapy: Secondary | ICD-10-CM | POA: Diagnosis not present

## 2017-11-16 DIAGNOSIS — Z9011 Acquired absence of right breast and nipple: Secondary | ICD-10-CM

## 2017-11-16 DIAGNOSIS — F419 Anxiety disorder, unspecified: Secondary | ICD-10-CM | POA: Diagnosis not present

## 2017-11-16 DIAGNOSIS — M791 Myalgia, unspecified site: Secondary | ICD-10-CM | POA: Diagnosis not present

## 2017-11-16 DIAGNOSIS — Z170 Estrogen receptor positive status [ER+]: Secondary | ICD-10-CM

## 2017-11-16 DIAGNOSIS — M797 Fibromyalgia: Secondary | ICD-10-CM

## 2017-11-16 DIAGNOSIS — C50311 Malignant neoplasm of lower-inner quadrant of right female breast: Secondary | ICD-10-CM | POA: Diagnosis not present

## 2017-11-16 NOTE — Telephone Encounter (Signed)
Scheduled appt per 8/6 los - per GM request - called and verbally request appt be scheduled at specific date and time. - no los placed yet. Pt aware of appt date and time.

## 2017-11-17 ENCOUNTER — Inpatient Hospital Stay: Payer: Medicare HMO

## 2017-11-17 ENCOUNTER — Telehealth: Payer: Self-pay | Admitting: Oncology

## 2017-11-17 ENCOUNTER — Other Ambulatory Visit: Payer: Self-pay | Admitting: Oncology

## 2017-11-17 VITALS — BP 136/72 | HR 70 | Temp 97.8°F | Resp 16

## 2017-11-17 DIAGNOSIS — Z170 Estrogen receptor positive status [ER+]: Principal | ICD-10-CM

## 2017-11-17 DIAGNOSIS — F419 Anxiety disorder, unspecified: Secondary | ICD-10-CM | POA: Diagnosis not present

## 2017-11-17 DIAGNOSIS — M791 Myalgia, unspecified site: Secondary | ICD-10-CM | POA: Diagnosis not present

## 2017-11-17 DIAGNOSIS — C50311 Malignant neoplasm of lower-inner quadrant of right female breast: Secondary | ICD-10-CM | POA: Diagnosis not present

## 2017-11-17 DIAGNOSIS — Z87891 Personal history of nicotine dependence: Secondary | ICD-10-CM | POA: Diagnosis not present

## 2017-11-17 DIAGNOSIS — Z9011 Acquired absence of right breast and nipple: Secondary | ICD-10-CM | POA: Diagnosis not present

## 2017-11-17 DIAGNOSIS — Z5111 Encounter for antineoplastic chemotherapy: Secondary | ICD-10-CM | POA: Diagnosis not present

## 2017-11-17 LAB — CMP (CANCER CENTER ONLY)
ALK PHOS: 87 U/L (ref 38–126)
ALT: 13 U/L (ref 0–44)
AST: 18 U/L (ref 15–41)
Albumin: 3.9 g/dL (ref 3.5–5.0)
Anion gap: 8 (ref 5–15)
BILIRUBIN TOTAL: 0.3 mg/dL (ref 0.3–1.2)
BUN: 15 mg/dL (ref 8–23)
CALCIUM: 9.4 mg/dL (ref 8.9–10.3)
CO2: 29 mmol/L (ref 22–32)
Chloride: 101 mmol/L (ref 98–111)
Creatinine: 0.71 mg/dL (ref 0.44–1.00)
GFR, Est AFR Am: >60 mL/min (ref >60)
GFR, Estimated: >60 mL/min (ref >60)
Glucose, Bld: 99 mg/dL (ref 70–99)
Potassium: 4.7 mmol/L (ref 3.5–5.1)
Sodium: 138 mmol/L (ref 135–145)
TOTAL PROTEIN: 7.2 g/dL (ref 6.5–8.1)

## 2017-11-17 LAB — CBC WITH DIFFERENTIAL (CANCER CENTER ONLY)
BASOS PCT: 1 %
Basophils Absolute: 0 10*3/uL (ref 0.0–0.1)
EOS PCT: 2 %
Eosinophils Absolute: 0.1 10*3/uL (ref 0.0–0.5)
HCT: 38.8 % (ref 34.8–46.6)
Hemoglobin: 13 g/dL (ref 11.6–15.9)
LYMPHS PCT: 38 %
Lymphs Abs: 2.2 10*3/uL (ref 0.9–3.3)
MCH: 31.8 pg (ref 25.1–34.0)
MCHC: 33.6 g/dL (ref 31.5–36.0)
MCV: 94.8 fL (ref 79.5–101.0)
MONO ABS: 0.6 10*3/uL (ref 0.1–0.9)
MONOS PCT: 10 %
Neutro Abs: 2.9 10*3/uL (ref 1.5–6.5)
Neutrophils Relative %: 49 %
PLATELETS: 249 10*3/uL (ref 145–400)
RBC: 4.09 MIL/uL (ref 3.70–5.45)
RDW: 12.6 % (ref 11.2–14.5)
WBC Count: 5.8 10*3/uL (ref 3.9–10.3)

## 2017-11-17 MED ORDER — DEXAMETHASONE SODIUM PHOSPHATE 10 MG/ML IJ SOLN
INTRAMUSCULAR | Status: AC
  Filled 2017-11-17: qty 1

## 2017-11-17 MED ORDER — SODIUM CHLORIDE 0.9% FLUSH
10.0000 mL | INTRAVENOUS | Status: DC | PRN
  Administered 2017-11-17: 10 mL
  Filled 2017-11-17: qty 10

## 2017-11-17 MED ORDER — METHOTREXATE SODIUM (PF) CHEMO INJECTION 250 MG/10ML
39.7000 mg/m2 | Freq: Once | INTRAMUSCULAR | Status: AC
Start: 2017-11-17 — End: 2017-11-17
  Administered 2017-11-17: 75 mg via INTRAVENOUS
  Filled 2017-11-17: qty 3

## 2017-11-17 MED ORDER — DEXAMETHASONE SODIUM PHOSPHATE 10 MG/ML IJ SOLN
10.0000 mg | Freq: Once | INTRAMUSCULAR | Status: AC
  Administered 2017-11-17: 10 mg via INTRAVENOUS

## 2017-11-17 MED ORDER — FLUOROURACIL CHEMO INJECTION 2.5 GM/50ML
600.0000 mg/m2 | Freq: Once | INTRAVENOUS | Status: AC
  Administered 2017-11-17: 1150 mg via INTRAVENOUS
  Filled 2017-11-17: qty 23

## 2017-11-17 MED ORDER — SODIUM CHLORIDE 0.9 % IV SOLN
Freq: Once | INTRAVENOUS | Status: AC
  Administered 2017-11-17: 11:00:00 via INTRAVENOUS
  Filled 2017-11-17: qty 250

## 2017-11-17 MED ORDER — HEPARIN SOD (PORK) LOCK FLUSH 100 UNIT/ML IV SOLN
500.0000 [IU] | Freq: Once | INTRAVENOUS | Status: AC | PRN
  Administered 2017-11-17: 500 [IU]
  Filled 2017-11-17: qty 5

## 2017-11-17 MED ORDER — PALONOSETRON HCL INJECTION 0.25 MG/5ML
INTRAVENOUS | Status: AC
Start: 2017-11-17 — End: ?
  Filled 2017-11-17: qty 5

## 2017-11-17 MED ORDER — PALONOSETRON HCL INJECTION 0.25 MG/5ML
0.2500 mg | Freq: Once | INTRAVENOUS | Status: AC
  Administered 2017-11-17: 0.25 mg via INTRAVENOUS

## 2017-11-17 MED ORDER — SODIUM CHLORIDE 0.9 % IV SOLN
600.0000 mg/m2 | Freq: Once | INTRAVENOUS | Status: AC
  Administered 2017-11-17: 1140 mg via INTRAVENOUS
  Filled 2017-11-17: qty 57

## 2017-11-17 NOTE — Telephone Encounter (Signed)
Per 8/6 no los. °

## 2017-11-17 NOTE — Patient Instructions (Signed)
[facility] Discharge Instructions for Patients Receiving Chemotherapy  Today you received the following chemotherapy agents Cytoxan, Methotrexate, 5FU  To help prevent nausea and vomiting after your treatment, we encourage you to take your nausea medication as prescribed.   If you develop nausea and vomiting that is not controlled by your nausea medication, call the clinic.   BELOW ARE SYMPTOMS THAT SHOULD BE REPORTED IMMEDIATELY:  *FEVER GREATER THAN 100.5 F  *CHILLS WITH OR WITHOUT FEVER  NAUSEA AND VOMITING THAT IS NOT CONTROLLED WITH YOUR NAUSEA MEDICATION  *UNUSUAL SHORTNESS OF BREATH  *UNUSUAL BRUISING OR BLEEDING  TENDERNESS IN MOUTH AND THROAT WITH OR WITHOUT PRESENCE OF ULCERS  *URINARY PROBLEMS  *BOWEL PROBLEMS  UNUSUAL RASH Items with * indicate a potential emergency and should be followed up as soon as possible.  Feel free to call the clinic should you have any questions or concerns. The clinic phone number is [phone].  Please show the [facility] at check-in to the Emergency Department and triage nurse.   Cyclophosphamide injection (Cytoxan) What is this medicine? CYCLOPHOSPHAMIDE (sye kloe FOSS fa mide) is a chemotherapy drug. It slows the growth of cancer cells. This medicine is used to treat many types of cancer like lymphoma, myeloma, leukemia, breast cancer, and ovarian cancer, to name a few. This medicine may be used for other purposes; ask your health care provider or pharmacist if you have questions. COMMON BRAND NAME(S): Cytoxan, Neosar What should I tell my health care provider before I take this medicine? They need to know if you have any of these conditions: -blood disorders -history of other chemotherapy -infection -kidney disease -liver disease -recent or ongoing radiation therapy -tumors in the bone marrow -an unusual or allergic reaction to cyclophosphamide, other chemotherapy, other medicines, foods, dyes, or  preservatives -pregnant or trying to get pregnant -breast-feeding How should I use this medicine? This drug is usually given as an injection into a vein or muscle or by infusion into a vein. It is administered in a hospital or clinic by a specially trained health care professional. Talk to your pediatrician regarding the use of this medicine in children. Special care may be needed. Overdosage: If you think you have taken too much of this medicine contact a poison control center or emergency room at once. NOTE: This medicine is only for you. Do not share this medicine with others. What if I miss a dose? It is important not to miss your dose. Call your doctor or health care professional if you are unable to keep an appointment. What may interact with this medicine? This medicine may interact with the following medications: -amiodarone -amphotericin B -azathioprine -certain antiviral medicines for HIV or AIDS such as protease inhibitors (e.g., indinavir, ritonavir) and zidovudine -certain blood pressure medications such as benazepril, captopril, enalapril, fosinopril, lisinopril, moexipril, monopril, perindopril, quinapril, ramipril, trandolapril -certain cancer medications such as anthracyclines (e.g., daunorubicin, doxorubicin), busulfan, cytarabine, paclitaxel, pentostatin, tamoxifen, trastuzumab -certain diuretics such as chlorothiazide, chlorthalidone, hydrochlorothiazide, indapamide, metolazone -certain medicines that treat or prevent blood clots like warfarin -certain muscle relaxants such as succinylcholine -cyclosporine -etanercept -indomethacin -medicines to increase blood counts like filgrastim, pegfilgrastim, sargramostim -medicines used as general anesthesia -metronidazole -natalizumab This list may not describe all possible interactions. Give your health care provider a list of all the medicines, herbs, non-prescription drugs, or dietary supplements you use. Also tell them if  you smoke, drink alcohol, or use illegal drugs. Some items may interact with your medicine. What should  I watch for while using this medicine? Visit your doctor for checks on your progress. This drug may make you feel generally unwell. This is not uncommon, as chemotherapy can affect healthy cells as well as cancer cells. Report any side effects. Continue your course of treatment even though you feel ill unless your doctor tells you to stop. Drink water or other fluids as directed. Urinate often, even at night. In some cases, you may be given additional medicines to help with side effects. Follow all directions for their use. Call your doctor or health care professional for advice if you get a fever, chills or sore throat, or other symptoms of a cold or flu. Do not treat yourself. This drug decreases your body's ability to fight infections. Try to avoid being around people who are sick. This medicine may increase your risk to bruise or bleed. Call your doctor or health care professional if you notice any unusual bleeding. Be careful brushing and flossing your teeth or using a toothpick because you may get an infection or bleed more easily. If you have any dental work done, tell your dentist you are receiving this medicine. You may get drowsy or dizzy. Do not drive, use machinery, or do anything that needs mental alertness until you know how this medicine affects you. Do not become pregnant while taking this medicine or for 1 year after stopping it. Women should inform their doctor if they wish to become pregnant or think they might be pregnant. Men should not father a child while taking this medicine and for 4 months after stopping it. There is a potential for serious side effects to an unborn child. Talk to your health care professional or pharmacist for more information. Do not breast-feed an infant while taking this medicine. This medicine may interfere with the ability to have a child. This medicine  has caused ovarian failure in some women. This medicine has caused reduced sperm counts in some men. You should talk with your doctor or health care professional if you are concerned about your fertility. If you are going to have surgery, tell your doctor or health care professional that you have taken this medicine. What side effects may I notice from receiving this medicine? Side effects that you should report to your doctor or health care professional as soon as possible: -allergic reactions like skin rash, itching or hives, swelling of the face, lips, or tongue -low blood counts - this medicine may decrease the number of white blood cells, red blood cells and platelets. You may be at increased risk for infections and bleeding. -signs of infection - fever or chills, cough, sore throat, pain or difficulty passing urine -signs of decreased platelets or bleeding - bruising, pinpoint red spots on the skin, black, tarry stools, blood in the urine -signs of decreased red blood cells - unusually weak or tired, fainting spells, lightheadedness -breathing problems -dark urine -dizziness -palpitations -swelling of the ankles, feet, hands -trouble passing urine or change in the amount of urine -weight gain -yellowing of the eyes or skin Side effects that usually do not require medical attention (report to your doctor or health care professional if they continue or are bothersome): -changes in nail or skin color -hair loss -missed menstrual periods -mouth sores -nausea, vomiting This list may not describe all possible side effects. Call your doctor for medical advice about side effects. You may report side effects to FDA at 1-800-FDA-1088. Where should I keep my medicine? This drug is given in  a hospital or clinic and will not be stored at home. NOTE: This sheet is a summary. It may not cover all possible information. If you have questions about this medicine, talk to your doctor, pharmacist, or  health care provider.  2018 [employer] Standard (2012-02-12 16:22:58)  Methotrexate injection (Methotrexate) What is this medicine? METHOTREXATE (METH oh TREX ate) is a chemotherapy drug used to treat cancer including breast cancer, leukemia, and lymphoma. This medicine can also be used to treat psoriasis and certain kinds of arthritis. This medicine may be used for other purposes; ask your health care provider or pharmacist if you have questions. What should I tell my health care provider before I take this medicine? They need to know if you have any of these conditions: -fluid in the stomach area or lungs -if you often drink alcohol -infection or immune system problems -kidney disease -liver disease -low blood counts, like low white cell, platelet, or red cell counts -lung disease -radiation therapy -stomach ulcers -ulcerative colitis -an unusual or allergic reaction to methotrexate, other medicines, foods, dyes, or preservatives -pregnant or trying to get pregnant -breast-feeding How should I use this medicine? This medicine is for infusion into a vein or for injection into muscle or into the spinal fluid (whichever applies). It is usually given by a health care professional in a hospital or clinic setting. In rare cases, you might get this medicine at home. You will be taught how to give this medicine. Use exactly as directed. Take your medicine at regular intervals. Do not take your medicine more often than directed. If this medicine is used for arthritis or psoriasis, it should be taken weekly, NOT daily. It is important that you put your used needles and syringes in a special sharps container. Do not put them in a trash can. If you do not have a sharps container, call your pharmacist or healthcare provider to get one. Talk to your pediatrician regarding the use of this medicine in children. While this drug may be prescribed for children as young as 2 years for selected  conditions, precautions do apply. Overdosage: If you think you have taken too much of this medicine contact a poison control center or emergency room at once. NOTE: This medicine is only for you. Do not share this medicine with others. What if I miss a dose? It is important not to miss your dose. Call your doctor or health care professional if you are unable to keep an appointment. If you give yourself the medicine and you miss a dose, talk with your doctor or health care professional. Do not take double or extra doses. What may interact with this medicine? This medicine may interact with the following medications: -acitretin -aspirin or aspirin-like medicines including salicylates -azathioprine -certain antibiotics like chloramphenicol, penicillin, tetracycline -certain medicines for stomach problems like esomeprazole, omeprazole, pantoprazole -cyclosporine -gold -hydroxychloroquine -live virus vaccines -mercaptopurine -NSAIDs, medicines for pain and inflammation, like ibuprofen or naproxen -other cytotoxic agents -penicillamine -phenylbutazone -phenytoin -probenacid -retinoids such as isotretinoin and tretinoin -steroid medicines like prednisone or cortisone -sulfonamides like sulfasalazine and trimethoprim/sulfamethoxazole -theophylline This list may not describe all possible interactions. Give your health care provider a list of all the medicines, herbs, non-prescription drugs, or dietary supplements you use. Also tell them if you smoke, drink alcohol, or use illegal drugs. Some items may interact with your medicine. What should I watch for while using this medicine? Avoid alcoholic drinks. In some cases, you may be given additional medicines to help with  side effects. Follow all directions for their use. This medicine can make you more sensitive to the sun. Keep out of the sun. If you cannot avoid being in the sun, wear protective clothing and use sunscreen. Do not use sun lamps  or tanning beds/booths. You may get drowsy or dizzy. Do not drive, use machinery, or do anything that needs mental alertness until you know how this medicine affects you. Do not stand or sit up quickly, especially if you are an older patient. This reduces the risk of dizzy or fainting spells. You may need blood work done while you are taking this medicine. Call your doctor or health care professional for advice if you get a fever, chills or sore throat, or other symptoms of a cold or flu. Do not treat yourself. This drug decreases your body's ability to fight infections. Try to avoid being around people who are sick. This medicine may increase your risk to bruise or bleed. Call your doctor or health care professional if you notice any unusual bleeding. Check with your doctor or health care professional if you get an attack of severe diarrhea, nausea and vomiting, or if you sweat a lot. The loss of too much body fluid can make it dangerous for you to take this medicine. Talk to your doctor about your risk of cancer. You may be more at risk for certain types of cancers if you take this medicine. Both men and women must use effective birth control with this medicine. Do not become pregnant while taking this medicine or until at least 1 normal menstrual cycle has occurred after stopping it. Women should inform their doctor if they wish to become pregnant or think they might be pregnant. Men should not father a child while taking this medicine and for 3 months after stopping it. There is a potential for serious side effects to an unborn child. Talk to your health care professional or pharmacist for more information. Do not breast-feed an infant while taking this medicine. What side effects may I notice from receiving this medicine? Side effects that you should report to your doctor or health care professional as soon as possible: -allergic reactions like skin rash, itching or hives, swelling of the face, lips,  or tongue -back pain -breathing problems or shortness of breath -confusion -diarrhea -dry, nonproductive cough -low blood counts - this medicine may decrease the number of white blood cells, red blood cells and platelets. You may be at increased risk of infections and bleeding -mouth sores -redness, blistering, peeling or loosening of the skin, including inside the mouth -seizures -severe headaches -signs of infection - fever or chills, cough, sore throat, pain or difficulty passing urine -signs and symptoms of bleeding such as bloody or black, tarry stools; red or dark-brown urine; spitting up blood or brown material that looks like coffee grounds; red spots on the skin; unusual bruising or bleeding from the eye, gums, or nose -signs and symptoms of kidney injury like trouble passing urine or change in the amount of urine -signs and symptoms of liver injury like dark yellow or brown urine; general ill feeling or flu-like symptoms; light-colored stools; loss of appetite; nausea; right upper belly pain; unusually weak or tired; yellowing of the eyes or skin -stiff neck -vomiting Side effects that usually do not require medical attention (report to your doctor or health care professional if they continue or are bothersome): -dizziness -hair loss -headache -stomach pain -upset stomach This list may not describe all possible side  effects. Call your doctor for medical advice about side effects. You may report side effects to FDA at 1-800-FDA-1088. Where should I keep my medicine? If you are using this medicine at home, you will be instructed on how to store this medicine. Throw away any unused medicine after the expiration date on the label. NOTE: This sheet is a summary. It may not cover all possible information. If you have questions about this medicine, talk to your doctor, pharmacist, or health care provider.  2018 [employer] Standard (2014-07-19 12:36:41)  Fluorouracil, 5-FU  injection What is this medicine? FLUOROURACIL, 5-FU (flure oh YOOR a sil) is a chemotherapy drug. It slows the growth of cancer cells. This medicine is used to treat many types of cancer like breast cancer, colon or rectal cancer, pancreatic cancer, and stomach cancer. This medicine may be used for other purposes; ask your health care provider or pharmacist if you have questions. COMMON BRAND NAME(S): Adrucil What should I tell my health care provider before I take this medicine? They need to know if you have any of these conditions: -blood disorders -dihydropyrimidine dehydrogenase (DPD) deficiency -infection (especially a virus infection such as chickenpox, cold sores, or herpes) -kidney disease -liver disease -malnourished, poor nutrition -recent or ongoing radiation therapy -an unusual or allergic reaction to fluorouracil, other chemotherapy, other medicines, foods, dyes, or preservatives -pregnant or trying to get pregnant -breast-feeding How should I use this medicine? This drug is given as an infusion or injection into a vein. It is administered in a hospital or clinic by a specially trained health care professional. Talk to your pediatrician regarding the use of this medicine in children. Special care may be needed. Overdosage: If you think you have taken too much of this medicine contact a poison control center or emergency room at once. NOTE: This medicine is only for you. Do not share this medicine with others. What if I miss a dose? It is important not to miss your dose. Call your doctor or health care professional if you are unable to keep an appointment. What may interact with this medicine? -allopurinol -cimetidine -dapsone -digoxin -hydroxyurea -leucovorin -levamisole -medicines for seizures like ethotoin, fosphenytoin, phenytoin -medicines to increase blood counts like filgrastim, pegfilgrastim, sargramostim -medicines that treat or prevent blood clots like  warfarin, enoxaparin, and dalteparin -methotrexate -metronidazole -pyrimethamine -some other chemotherapy drugs like busulfan, cisplatin, estramustine, vinblastine -trimethoprim -trimetrexate -vaccines Talk to your doctor or health care professional before taking any of these medicines: -acetaminophen -aspirin -ibuprofen -ketoprofen -naproxen This list may not describe all possible interactions. Give your health care provider a list of all the medicines, herbs, non-prescription drugs, or dietary supplements you use. Also tell them if you smoke, drink alcohol, or use illegal drugs. Some items may interact with your medicine. What should I watch for while using this medicine? Visit your doctor for checks on your progress. This drug may make you feel generally unwell. This is not uncommon, as chemotherapy can affect healthy cells as well as cancer cells. Report any side effects. Continue your course of treatment even though you feel ill unless your doctor tells you to stop. In some cases, you may be given additional medicines to help with side effects. Follow all directions for their use. Call your doctor or health care professional for advice if you get a fever, chills or sore throat, or other symptoms of a cold or flu. Do not treat yourself. This drug decreases your body's ability to fight infections. Try to avoid being around  people who are sick. This medicine may increase your risk to bruise or bleed. Call your doctor or health care professional if you notice any unusual bleeding. Be careful brushing and flossing your teeth or using a toothpick because you may get an infection or bleed more easily. If you have any dental work done, tell your dentist you are receiving this medicine. Avoid taking products that contain aspirin, acetaminophen, ibuprofen, naproxen, or ketoprofen unless instructed by your doctor. These medicines may hide a fever. Do not become pregnant while taking this medicine.  Women should inform their doctor if they wish to become pregnant or think they might be pregnant. There is a potential for serious side effects to an unborn child. Talk to your health care professional or pharmacist for more information. Do not breast-feed an infant while taking this medicine. Men should inform their doctor if they wish to father a child. This medicine may lower sperm counts. Do not treat diarrhea with over the counter products. Contact your doctor if you have diarrhea that lasts more than 2 days or if it is severe and watery. This medicine can make you more sensitive to the sun. Keep out of the sun. If you cannot avoid being in the sun, wear protective clothing and use sunscreen. Do not use sun lamps or tanning beds/booths. What side effects may I notice from receiving this medicine? Side effects that you should report to your doctor or health care professional as soon as possible: -allergic reactions like skin rash, itching or hives, swelling of the face, lips, or tongue -low blood counts - this medicine may decrease the number of white blood cells, red blood cells and platelets. You may be at increased risk for infections and bleeding. -signs of infection - fever or chills, cough, sore throat, pain or difficulty passing urine -signs of decreased platelets or bleeding - bruising, pinpoint red spots on the skin, black, tarry stools, blood in the urine -signs of decreased red blood cells - unusually weak or tired, fainting spells, lightheadedness -breathing problems -changes in vision -chest pain -mouth sores -nausea and vomiting -pain, swelling, redness at site where injected -pain, tingling, numbness in the hands or feet -redness, swelling, or sores on hands or feet -stomach pain -unusual bleeding Side effects that usually do not require medical attention (report to your doctor or health care professional if they continue or are bothersome): -changes in finger or toe  nails -diarrhea -dry or itchy skin -hair loss -headache -loss of appetite -sensitivity of eyes to the light -stomach upset -unusually teary eyes This list may not describe all possible side effects. Call your doctor for medical advice about side effects. You may report side effects to FDA at 1-800-FDA-1088. Where should I keep my medicine? This drug is given in a hospital or clinic and will not be stored at home. NOTE: This sheet is a summary. It may not cover all possible information. If you have questions about this medicine, talk to your doctor, pharmacist, or health care provider.  2018 [employer] Standard (2007-08-03 13:53:16)

## 2017-11-18 ENCOUNTER — Encounter: Payer: Medicare HMO | Admitting: Physical Therapy

## 2017-11-26 NOTE — Progress Notes (Signed)
[facility]  [phone] [phone]     ID: Lauren Holt DOB: 1940/09/29  MR#: [id number]  RCV#:[id number]  Patient Care Team: Lauren Holt, Lauren Jews.Marlou Sa, MD as PCP - General (Family Medicine) Lauren Kussmaul, MD as Consulting Physician (General Surgery) Lauren Holt, Lauren Dad, MD as Consulting Physician (Oncology) Lauren Rudd, MD as Consulting Physician (Radiation Oncology) OTHER MD:  CHIEF COMPLAINT: Estrogen receptor positive breast cancer  CURRENT TREATMENT: Adjuvant chemotherapy   HISTORY OF CURRENT ILLNESS: On the original intake note:  Lauren Holt had routine screening mammography on 06/14/2017 showing a possible area of calcifications in the right breast. She underwent unilateral right diagnostic mammography with tomography at [facility] on 06/23/2017 showing: Suspicious calcifications within the inferior right breast, spanning nearly 10 cm, more superior component spanning 4 cm extent. The more superior component may have a small associated mass. Ultrasonography of the right axilla on 07/01/2017 showed normal right axillary lymph nodes.   Accordingly on 06/29/2017 she proceeded to biopsy of the right breast area in question. The pathology from this procedure showed ([id number]): In the right breast lower central, more lateral superior: invasive ductal carcinoma grade I. In the lower central, more medial inferior: Ductal carcinoma in situ, high grade, with calcifications. The  invasive tumor was significant for estrogen receptor, 100% positive and progesterone receptor, 10% positive, both with strong staining intensity. Proliferation marker Ki67 at 5%. HER2  not amplified. The ductal carcinoma in situ was significant for estrogen receptor, 100% positive and progesterone receptor, 80% positive, both with strong staining intensity.   The patient's subsequent history is as detailed below.  INTERVAL HISTORY: Lauren Holt returns today for follow up and  treatment of her estrogen receptor positive breast cancer. She receives adjuvant chemotherapy with CMF given every 21 days for 8 planned cycles. Today is day 13 of cycle 1.   She tolerated this well. She took her nausea medications (Compazine) for just one day, and she denies having any nausea or vomiting. She has some insomnia, and she takes Xanax and Benadryl to aid this. She complains of redness in the right lower gum. Her energy is good, and she only had one instance of being tired and achy. She denies having any hair loss.   REVIEW OF SYSTEMS: Lauren Holt does not plan on losing her hair, but if she does, she will buy wigs. She goes swimming at her neighborhood pool for exercise. She denies unusual headaches, visual changes, nausea, vomiting, or dizziness. There has been no unusual cough, phlegm production, or pleurisy. There has been no change in bowel or bladder habits. She denies unexplained fatigue or unexplained weight loss, bleeding, rash, or fever. A detailed review of systems was otherwise stable.   PAST MEDICAL HISTORY: Past Medical History:  Diagnosis Date  . Anxiety   . Breast cancer, right breast ([place])   . Complication of anesthesia    difficult to wake - many years ago with tubal 50 years ago  . Dermatitis   . Family history of adverse reaction to anesthesia    "I think my sister was hard to wake up" (09/24/2017)  . Family history of breast cancer 07/12/2017  . Family history of prostate cancer 07/12/2017  . Genetic testing   . Palpitations    when she lays down, has not seen anyone for this, feels like a flutter   HLD ( but not too high)   PAST SURGICAL HISTORY: Past Surgical History:  Procedure Laterality Date  . BREAST BIOPSY Right 06/2017  .  BREAST RECONSTRUCTION WITH PLACEMENT OF TISSUE EXPANDER AND ALLODERM Right 09/24/2017  . BREAST RECONSTRUCTION WITH PLACEMENT OF TISSUE EXPANDER AND ALLODERM Right 09/24/2017   Procedure: BREAST RECONSTRUCTION WITH PLACEMENT OF TISSUE  EXPANDER AND ALLODERM;  Surgeon: Lauren Limbo, MD;  Location: [facility];  Service: Plastics;  Laterality: Right;  . CARPAL TUNNEL RELEASE Bilateral   . COLONOSCOPY    . MASTECTOMY COMPLETE / SIMPLE W/ SENTINEL NODE BIOPSY Right 09/24/2017  . MASTECTOMY W/ SENTINEL NODE BIOPSY Right 09/24/2017   Procedure: MASTECTOMY WITH SENTINEL LYMPH NODE BIOPSY;  Surgeon: Lauren Kussmaul, MD;  Location: [facility];  Service: General;  Laterality: Right;  . PORTACATH PLACEMENT Left 11/08/2017   Procedure: INSERTION PORT-A-CATH;  Surgeon: Lauren Kussmaul, MD;  Location: [facility];  Service: General;  Laterality: Left;  . TUBAL LIGATION    . VAGINAL HYSTERECTOMY     Partial Hysterectomy without BSO. Because they were too close to the bladder.    FAMILY HISTORY Family History  Problem Relation Age of Onset  . Breast cancer Paternal Aunt   . Breast cancer Cousin 15  . Prostate cancer Father 19  . Breast cancer Cousin 54  . Breast cancer Cousin 25  . Breast cancer Cousin 78  . Breast cancer Other    The patient's father died at age 43 due to emphysema. The patient's mother is alive at age 41 as of March 2019. The patient has 5 brothers and 3 sisters. There was a paternal great aunt with breast cancer. There was also a paternal cousin with breast cancer.   GYNECOLOGIC HISTORY:  No LMP recorded. Patient has had a hysterectomy. Menarche: 77 years old Age at first live birth: 77 years old The patient is GXP3. She is s/p partial hysterectomy. Both of her ovaries remain. She used oral contraceptives with no complications. She also used HRT for 2 years without complications.     SOCIAL HISTORY:  Lauren Holt is now retired. 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 She is divorced. Her oldest daughter, Lauren Holt", is a [employer]th Land in [place], [place]. The patient's son, Lauren Holt, works in a Proofreader in [place]. The patient's son, Lauren Holt, works in Quarry manager in [place].   The patient has 4 grandchildren. She currently does not belong to a church.      ADVANCED DIRECTIVES: Not in place.  At the 07/07/2017 visit the patient was given the appropriate documents to complete and notarized at her discretion.  HEALTH MAINTENANCE: Social History   Tobacco Use  . Smoking status: Former Smoker    Years: 1.00    Types: Cigarettes  . Smokeless tobacco: Never Used  . Tobacco comment: Smoked X 1 year at age 58 (less than ppd)  Substance Use Topics  . Alcohol use: Not Currently  . Drug use: Never     Colonoscopy: 02/2017 at [facility]  PAP:   Bone density: 2018/ osteopenia   No Known Allergies  Current Outpatient Medications  Medication Sig Dispense Refill  . ALPRAZolam (XANAX) 0.25 MG tablet Take 0.125 mg by mouth 2 (two) times daily as needed for anxiety.     Marland Kitchen aspirin 325 MG tablet Take 325 mg by mouth daily.    Marland Kitchen BETA CAROTENE PO Take 7,500 mcg by mouth daily.    . calcium carbonate (CALCIUM 600) 600 MG TABS tablet Take 600 mg by mouth daily.    . Cholecalciferol (VITAMIN D-3) 5000 units TABS Take  1 tablet by mouth daily.    . citalopram (CELEXA) 10 MG tablet Take 10 mg by mouth daily.  0  . doxycycline (VIBRAMYCIN) 50 MG capsule Take 1 capsule (50 mg total) by mouth 2 (two) times daily. 12 capsule 0  . lidocaine-prilocaine (EMLA) cream Apply 1 application topically as needed. 30 g 1  . Lutein 20 MG CAPS Take 20 mg by mouth daily.     . Magnesium Citrate 100 MG TABS Take 100 mg by mouth daily.     . methocarbamol (ROBAXIN) 500 MG tablet Take 1 tablet (500 mg total) by mouth every 8 (eight) hours as needed for muscle spasms. 30 tablet 0  . Multiple Vitamins-Minerals (ICAPS AREDS FORMULA PO) Take 1 capsule by mouth 3 (three) times a week.     . Probiotic Product (PROBIOTIC DAILY PO) Take 1 capsule by mouth 3 (three) times a week.     . prochlorperazine (COMPAZINE) 10 MG tablet Take 1 tablet (10 mg total) by mouth every 6 (six) hours as needed for  nausea or vomiting. 30 tablet 0  . Red Yeast Rice 600 MG CAPS Take 600 mg by mouth daily.     Marland Kitchen VITAMIN E PO Take 180 mg by mouth daily.    Marland Kitchen zinc gluconate 50 MG tablet Take 50 mg by mouth daily as needed (cold prevention).     No current facility-administered medications for this visit.     OBJECTIVE: Middle-aged white woman who appears well  Vitals:   11/29/17 1315  BP: 127/66  Pulse: 100  Resp: 18  Temp: 98.3 F (36.8 C)  SpO2: 98%     Body mass index is 31.58 kg/m.   Wt Readings from Last 3 Encounters:  11/29/17 178 lb 4.8 oz (80.9 kg)  11/16/17 175 lb 4.8 oz (79.5 kg)  11/08/17 173 lb (78.5 kg)      ECOG FS:1 - Symptomatic but completely ambulatory  Sclerae unicteric, pupils round and equal Oropharynx clear and moist No cervical or supraclavicular adenopathy Lungs no rales or rhonchi Heart regular rate and rhythm Abd soft, nontender, positive bowel sounds MSK no focal spinal tenderness, no upper extremity lymphedema Neuro: nonfocal, well oriented, appropriate affect Breasts: Status post right mastectomy, with expander in place.  The left breast is unremarkable.  Both axillae are benign.  LAB RESULTS:  CMP     Component Value Date/Time   NA 140 11/29/2017 1251   K 4.3 11/29/2017 1251   CL 103 11/29/2017 1251   CO2 30 11/29/2017 1251   GLUCOSE 94 11/29/2017 1251   BUN 11 11/29/2017 1251   CREATININE 0.76 11/29/2017 1251   CALCIUM 9.1 11/29/2017 1251   PROT 6.8 11/29/2017 1251   ALBUMIN 3.8 11/29/2017 1251   AST 17 11/29/2017 1251   ALT 15 11/29/2017 1251   ALKPHOS 92 11/29/2017 1251   BILITOT 0.3 11/29/2017 1251   GFRNONAA >60 11/29/2017 1251   GFRAA >60 11/29/2017 1251    No results found for: TOTALPROTELP, ALBUMINELP, A1GS, A2GS, BETS, BETA2SER, GAMS, MSPIKE, SPEI  No results found for: KPAFRELGTCHN, LAMBDASER, KAPLAMBRATIO  Lab Results  Component Value Date   WBC 3.1 (L) 11/29/2017   NEUTROABS 1.5 11/29/2017   HGB 12.6 11/29/2017   HCT 37.7  11/29/2017   MCV 94.9 11/29/2017   PLT 250 11/29/2017    '@LASTCHEMISTRY'$ @  No results found for: LABCA2  No components found for: VZCHYI502  No results for input(s): INR in the last 168 hours.  No results found  for: LABCA2  No results found for: IHK742  No results found for: VZD638  No results found for: VFI433  No results found for: CA2729  No components found for: HGQUANT  No results found for: CEA1 / No results found for: CEA1   No results found for: AFPTUMOR  No results found for: CHROMOGRNA  No results found for: PSA1  Appointment on 11/29/2017  Component Date Value Ref Range Status  . WBC Count 11/29/2017 3.1* 3.9 - 10.3 K/uL Final  . RBC 11/29/2017 3.97  3.70 - 5.45 MIL/uL Final  . Hemoglobin 11/29/2017 12.6  11.6 - 15.9 g/dL Final  . HCT 11/29/2017 37.7  34.8 - 46.6 % Final  . MCV 11/29/2017 94.9  79.5 - 101.0 fL Final  . MCH 11/29/2017 31.8  25.1 - 34.0 pg Final  . MCHC 11/29/2017 33.5  31.5 - 36.0 g/dL Final  . RDW 11/29/2017 12.8  11.2 - 14.5 % Final  . Platelet Count 11/29/2017 250  145 - 400 K/uL Final  . Neutrophils Relative % 11/29/2017 50  % Final  . Neutro Abs 11/29/2017 1.5  1.5 - 6.5 K/uL Final  . Lymphocytes Relative 11/29/2017 41  % Final  . Lymphs Abs 11/29/2017 1.3  0.9 - 3.3 K/uL Final  . Monocytes Relative 11/29/2017 7  % Final  . Monocytes Absolute 11/29/2017 0.2  0.1 - 0.9 K/uL Final  . Eosinophils Relative 11/29/2017 1  % Final  . Eosinophils Absolute 11/29/2017 0.0  0.0 - 0.5 K/uL Final  . Basophils Relative 11/29/2017 1  % Final  . Basophils Absolute 11/29/2017 0.0  0.0 - 0.1 K/uL Final   Performed at [facility], [place] [address], [place], [place] [address]  . Sodium 11/29/2017 140  135 - 145 mmol/L Final  . Potassium 11/29/2017 4.3  3.5 - 5.1 mmol/L Final  . Chloride 11/29/2017 103  98 - 111 mmol/L Final  . CO2 11/29/2017 30  22 - 32 mmol/L Final  . Glucose, Bld 11/29/2017 94  70 - 99 mg/dL Final  . BUN  11/29/2017 11  8 - 23 mg/dL Final  . Creatinine 11/29/2017 0.76  0.44 - 1.00 mg/dL Final  . Calcium 11/29/2017 9.1  8.9 - 10.3 mg/dL Final  . Total Protein 11/29/2017 6.8  6.5 - 8.1 g/dL Final  . Albumin 11/29/2017 3.8  3.5 - 5.0 g/dL Final  . AST 11/29/2017 17  15 - 41 U/L Final  . ALT 11/29/2017 15  0 - 44 U/L Final  . Alkaline Phosphatase 11/29/2017 92  38 - 126 U/L Final  . Total Bilirubin 11/29/2017 0.3  0.3 - 1.2 mg/dL Final  . GFR, Est Non Af Am 11/29/2017 >60  >60 mL/min Final  . GFR, Est AFR Am 11/29/2017 >60  >60 mL/min Final   Comment: (NOTE) The eGFR has been calculated using the CKD EPI equation. This calculation has not been validated in all clinical situations. eGFR's persistently <60 mL/min signify possible Chronic Kidney Disease.   Lauren Holt gap 11/29/2017 7  5 - 15 Final   Performed at [facility], [place] [address] [place],  [address]    (this displays the last labs from the last 3 days)  No results found for: TOTALPROTELP, ALBUMINELP, A1GS, A2GS, BETS, BETA2SER, GAMS, MSPIKE, SPEI (this displays SPEP labs)  No results found for: KPAFRELGTCHN, LAMBDASER, KAPLAMBRATIO (kappa/lambda light chains)  No results found for: HGBA, HGBA2QUANT, HGBFQUANT, HGBSQUAN (Hemoglobinopathy evaluation)   No results found for:  LDH  No results found for: IRON, TIBC, IRONPCTSAT (Iron and TIBC)  No results found for: FERRITIN  Urinalysis No results found for: COLORURINE, APPEARANCEUR, LABSPEC, PHURINE, GLUCOSEU, HGBUR, BILIRUBINUR, KETONESUR, PROTEINUR, UROBILINOGEN, NITRITE, LEUKOCYTESUR   STUDIES: Dg Chest Port 1 View  Result Date: 11/08/2017 CLINICAL DATA:  Initial evaluation for Port-A-Cath. EXAM: PORTABLE CHEST 1 VIEW COMPARISON:  None. FINDINGS: Left-sided subclavian approach Port-A-Cath in place with tip overlying the cavoatrial junction. Cardiac and mediastinal silhouettes within normal limits. Lungs normally inflated. No focal  infiltrates. No pulmonary edema or pleural effusion. No pneumothorax. No acute osseous abnormality. Right breast tissue expander in place. Mild scoliosis noted. IMPRESSION: 1. Left subclavian approach central venous catheter in place with tip overlying the cavoatrial junction. 2. No other active cardiopulmonary disease. Electronically Signed   By: Jeannine Boga M.D.   On: 11/08/2017 16:00   Dg Fluoro Guide Cv Line-no Report  Result Date: 11/08/2017 Fluoroscopy was utilized by the requesting physician.  No radiographic interpretation.    ELIGIBLE FOR AVAILABLE RESEARCH PROTOCOL: exact sciences study  ASSESSMENT: 77 y.o. [place], [place] woman status post right breast biopsy 07/01/2017 for a clinical mT2 N0, stage IB invasive ductal carcinoma, grade 1, estrogen and progesterone receptor positive, HER-2 nonamplified, with an MIB-105%  (1) a second area in the right breast biopsied 07/01/2017 showed ductal carcinoma in situ, grade 3, estrogen and progesterone receptor positive.  (2) status post right mastectomy and sentinel lymph node sampling 09/24/2017 for a pT1b pN0, stage IA invasive ductal carcinoma, grade 1, with negative margins  (a) a total of 5 sentinel lymph nodes were removed  (3) The Oncotype DX score was 28, predicting a risk of outside the breast recurrence over the next 9 years of 17% if the patient's only systemic therapy is tamoxifen for 5 years.  It also predicts a significant benefit from chemotherapy.  (4) cyclophosphamide, methotrexate and fluorouracil (CMF) chemotherapy started 11/17/2017, repeated every 21 days x 8  (5) anastrozole started 08/04/2016, interrupted during chemotherapy  (6) genetics testing 07/22/2017 through the Common Hereditary Cancer Panel offered by [facility] found no deleterious mutations in APC, ATM, AXIN2, BARD1, BMPR1A, BRCA1, BRCA2, BRIP1, CDH1, CDKN2A (p14ARF), CDKN2A (p16INK4a), CKD4, CHEK2, CTNNA1, DICER1, EPCAM (Deletion/duplication testing  only), GREM1 (promoter region deletion/duplication testing only), KIT, MEN1, MLH1, MSH2, MSH3, MSH6, MUTYH, NBN, NF1, NHTL1, PALB2, PDGFRA, PMS2, POLD1, POLE, PTEN, RAD50, RAD51C, RAD51D, SDHB, SDHC, SDHD, SMAD4, SMARCA4. STK11, TP53, TSC1, TSC2, and VHL.  The following genes were evaluated for sequence changes only: SDHA and HOXB13 c.251G>A variant only.   (a) a variant of uncertain significance in the gene DICER1 was identified.  c.2116+6G>A (Intronic)   PLAN: Kennedi did remarkably well with her first cycle of CMF chemotherapy.  She had few symptoms and she was able to manage these easily.  Her functional status was not affected.  She is not taking the antinausea medicines quite as prescribed, but it is working for her.  Her counts today are very reassuring that she will not need Neulasta, at least not initially.  Sometimes the bone marrow does become "fatigue" and she might need an immune booster at that point  Accordingly we are not going to make any major changes in her treatment plan.  I did suggest that instead of peroxide rinses she use saline and I gave her the recipe and how to make her own saline at home.  We discussed seeing her on the day of treatment, or a week after treatment, or both, and decided  in her case since she is doing so well seeing her a week after treatment would be adequate.  Of course if she has any questions or concerns on treatment days she will request to see me or my 42 assistant  She knows to call for any other issues that may develop before the next visit.  Tahja Liao, Lauren Dad, MD  11/29/17 1:53 PM Medical Oncology and Hematology [facility] [address] [place], [place] [address] Tel. [phone]    Fax. [phone]  Alice Rieger, am acting as scribe for Chauncey Cruel MD.  I, Lurline Del MD, have reviewed the above documentation for accuracy and completeness, and I agree with the above.

## 2017-11-29 ENCOUNTER — Inpatient Hospital Stay: Payer: Medicare HMO

## 2017-11-29 ENCOUNTER — Telehealth: Payer: Self-pay | Admitting: Oncology

## 2017-11-29 ENCOUNTER — Inpatient Hospital Stay (HOSPITAL_BASED_OUTPATIENT_CLINIC_OR_DEPARTMENT_OTHER): Payer: Medicare HMO | Admitting: Oncology

## 2017-11-29 VITALS — BP 127/66 | HR 100 | Temp 98.3°F | Resp 18 | Ht 63.0 in | Wt 178.3 lb

## 2017-11-29 DIAGNOSIS — C50311 Malignant neoplasm of lower-inner quadrant of right female breast: Secondary | ICD-10-CM

## 2017-11-29 DIAGNOSIS — F419 Anxiety disorder, unspecified: Secondary | ICD-10-CM | POA: Diagnosis not present

## 2017-11-29 DIAGNOSIS — Z170 Estrogen receptor positive status [ER+]: Principal | ICD-10-CM

## 2017-11-29 DIAGNOSIS — Z5111 Encounter for antineoplastic chemotherapy: Secondary | ICD-10-CM | POA: Diagnosis not present

## 2017-11-29 DIAGNOSIS — Z9011 Acquired absence of right breast and nipple: Secondary | ICD-10-CM

## 2017-11-29 DIAGNOSIS — M791 Myalgia, unspecified site: Secondary | ICD-10-CM | POA: Diagnosis not present

## 2017-11-29 DIAGNOSIS — Z87891 Personal history of nicotine dependence: Secondary | ICD-10-CM | POA: Diagnosis not present

## 2017-11-29 LAB — CMP (CANCER CENTER ONLY)
ALBUMIN: 3.8 g/dL (ref 3.5–5.0)
ALT: 15 U/L (ref 0–44)
ANION GAP: 7 (ref 5–15)
AST: 17 U/L (ref 15–41)
Alkaline Phosphatase: 92 U/L (ref 38–126)
BILIRUBIN TOTAL: 0.3 mg/dL (ref 0.3–1.2)
BUN: 11 mg/dL (ref 8–23)
CO2: 30 mmol/L (ref 22–32)
Calcium: 9.1 mg/dL (ref 8.9–10.3)
Chloride: 103 mmol/L (ref 98–111)
Creatinine: 0.76 mg/dL (ref 0.44–1.00)
GFR, Est AFR Am: >60 mL/min (ref >60)
GFR, Estimated: >60 mL/min (ref >60)
GLUCOSE: 94 mg/dL (ref 70–99)
POTASSIUM: 4.3 mmol/L (ref 3.5–5.1)
SODIUM: 140 mmol/L (ref 135–145)
TOTAL PROTEIN: 6.8 g/dL (ref 6.5–8.1)

## 2017-11-29 LAB — CBC WITH DIFFERENTIAL (CANCER CENTER ONLY)
BASOS PCT: 1 %
Basophils Absolute: 0 10*3/uL (ref 0.0–0.1)
EOS PCT: 1 %
Eosinophils Absolute: 0 10*3/uL (ref 0.0–0.5)
HCT: 37.7 % (ref 34.8–46.6)
Hemoglobin: 12.6 g/dL (ref 11.6–15.9)
Lymphocytes Relative: 41 %
Lymphs Abs: 1.3 10*3/uL (ref 0.9–3.3)
MCH: 31.8 pg (ref 25.1–34.0)
MCHC: 33.5 g/dL (ref 31.5–36.0)
MCV: 94.9 fL (ref 79.5–101.0)
MONO ABS: 0.2 10*3/uL (ref 0.1–0.9)
MONOS PCT: 7 %
Neutro Abs: 1.5 10*3/uL (ref 1.5–6.5)
Neutrophils Relative %: 50 %
PLATELETS: 250 10*3/uL (ref 145–400)
RBC: 3.97 MIL/uL (ref 3.70–5.45)
RDW: 12.8 % (ref 11.2–14.5)
WBC Count: 3.1 10*3/uL — ABNORMAL LOW (ref 3.9–10.3)

## 2017-11-29 NOTE — Telephone Encounter (Signed)
Gave patient AVS and calendar.  Waiting on override to get 9/4 appt @ noon w/ GM @ 12 pm as requested.

## 2017-12-08 ENCOUNTER — Inpatient Hospital Stay: Payer: Medicare HMO

## 2017-12-08 ENCOUNTER — Other Ambulatory Visit: Payer: Self-pay | Admitting: Oncology

## 2017-12-08 VITALS — BP 105/60 | HR 72 | Temp 98.3°F | Resp 18

## 2017-12-08 DIAGNOSIS — F419 Anxiety disorder, unspecified: Secondary | ICD-10-CM | POA: Diagnosis not present

## 2017-12-08 DIAGNOSIS — Z170 Estrogen receptor positive status [ER+]: Secondary | ICD-10-CM | POA: Diagnosis not present

## 2017-12-08 DIAGNOSIS — C50311 Malignant neoplasm of lower-inner quadrant of right female breast: Secondary | ICD-10-CM

## 2017-12-08 DIAGNOSIS — Z87891 Personal history of nicotine dependence: Secondary | ICD-10-CM | POA: Diagnosis not present

## 2017-12-08 DIAGNOSIS — M791 Myalgia, unspecified site: Secondary | ICD-10-CM | POA: Diagnosis not present

## 2017-12-08 DIAGNOSIS — Z9011 Acquired absence of right breast and nipple: Secondary | ICD-10-CM | POA: Diagnosis not present

## 2017-12-08 DIAGNOSIS — Z5111 Encounter for antineoplastic chemotherapy: Secondary | ICD-10-CM | POA: Diagnosis not present

## 2017-12-08 DIAGNOSIS — Z95828 Presence of other vascular implants and grafts: Secondary | ICD-10-CM

## 2017-12-08 LAB — CBC WITH DIFFERENTIAL (CANCER CENTER ONLY)
BASOS ABS: 0 10*3/uL (ref 0.0–0.1)
Basophils Relative: 1 %
EOS PCT: 1 %
Eosinophils Absolute: 0 10*3/uL (ref 0.0–0.5)
HCT: 37.6 % (ref 34.8–46.6)
Hemoglobin: 12.6 g/dL (ref 11.6–15.9)
LYMPHS PCT: 42 %
Lymphs Abs: 1.3 10*3/uL (ref 0.9–3.3)
MCH: 31.7 pg (ref 25.1–34.0)
MCHC: 33.4 g/dL (ref 31.5–36.0)
MCV: 94.9 fL (ref 79.5–101.0)
MONO ABS: 0.3 10*3/uL (ref 0.1–0.9)
MONOS PCT: 10 %
Neutro Abs: 1.4 10*3/uL — ABNORMAL LOW (ref 1.5–6.5)
Neutrophils Relative %: 46 %
PLATELETS: 253 10*3/uL (ref 145–400)
RBC: 3.96 MIL/uL (ref 3.70–5.45)
RDW: 13.3 % (ref 11.2–14.5)
WBC Count: 3.1 10*3/uL — ABNORMAL LOW (ref 3.9–10.3)

## 2017-12-08 LAB — CMP (CANCER CENTER ONLY)
ALT: 18 U/L (ref 0–44)
AST: 21 U/L (ref 15–41)
Albumin: 3.7 g/dL (ref 3.5–5.0)
Alkaline Phosphatase: 92 U/L (ref 38–126)
Anion gap: 9 (ref 5–15)
BUN: 13 mg/dL (ref 8–23)
CO2: 28 mmol/L (ref 22–32)
Calcium: 9.3 mg/dL (ref 8.9–10.3)
Chloride: 104 mmol/L (ref 98–111)
Creatinine: 0.78 mg/dL (ref 0.44–1.00)
GFR, Est AFR Am: >60 mL/min
GFR, Estimated: >60 mL/min
Glucose, Bld: 165 mg/dL — ABNORMAL HIGH (ref 70–99)
Potassium: 3.9 mmol/L (ref 3.5–5.1)
Sodium: 141 mmol/L (ref 135–145)
Total Bilirubin: 0.2 mg/dL — ABNORMAL LOW (ref 0.3–1.2)
Total Protein: 6.7 g/dL (ref 6.5–8.1)

## 2017-12-08 MED ORDER — METHOTREXATE SODIUM (PF) CHEMO INJECTION 250 MG/10ML
75.0000 mg | Freq: Once | INTRAMUSCULAR | Status: AC
  Administered 2017-12-08: 75 mg via INTRAVENOUS
  Filled 2017-12-08: qty 2

## 2017-12-08 MED ORDER — HEPARIN SOD (PORK) LOCK FLUSH 100 UNIT/ML IV SOLN
500.0000 [IU] | Freq: Once | INTRAVENOUS | Status: AC | PRN
  Administered 2017-12-08: 500 [IU]
  Filled 2017-12-08: qty 5

## 2017-12-08 MED ORDER — FLUOROURACIL CHEMO INJECTION 2.5 GM/50ML
600.0000 mg/m2 | Freq: Once | INTRAVENOUS | Status: AC
  Administered 2017-12-08: 1150 mg via INTRAVENOUS
  Filled 2017-12-08: qty 23

## 2017-12-08 MED ORDER — DEXAMETHASONE SODIUM PHOSPHATE 10 MG/ML IJ SOLN
10.0000 mg | Freq: Once | INTRAMUSCULAR | Status: AC
  Administered 2017-12-08: 10 mg via INTRAVENOUS

## 2017-12-08 MED ORDER — SODIUM CHLORIDE 0.9% FLUSH
10.0000 mL | INTRAVENOUS | Status: DC | PRN
  Administered 2017-12-08: 10 mL via INTRAVENOUS
  Filled 2017-12-08: qty 10

## 2017-12-08 MED ORDER — SODIUM CHLORIDE 0.9 % IV SOLN
Freq: Once | INTRAVENOUS | Status: AC
  Administered 2017-12-08: 13:00:00 via INTRAVENOUS
  Filled 2017-12-08: qty 250

## 2017-12-08 MED ORDER — PALONOSETRON HCL INJECTION 0.25 MG/5ML
INTRAVENOUS | Status: AC
  Filled 2017-12-08: qty 5

## 2017-12-08 MED ORDER — SODIUM CHLORIDE 0.9% FLUSH
10.0000 mL | INTRAVENOUS | Status: DC | PRN
  Administered 2017-12-08: 10 mL
  Filled 2017-12-08: qty 10

## 2017-12-08 MED ORDER — DEXAMETHASONE SODIUM PHOSPHATE 10 MG/ML IJ SOLN
INTRAMUSCULAR | Status: AC
  Filled 2017-12-08: qty 1

## 2017-12-08 MED ORDER — PALONOSETRON HCL INJECTION 0.25 MG/5ML
0.2500 mg | Freq: Once | INTRAVENOUS | Status: AC
  Administered 2017-12-08: 0.25 mg via INTRAVENOUS

## 2017-12-08 MED ORDER — SODIUM CHLORIDE 0.9 % IV SOLN
600.0000 mg/m2 | Freq: Once | INTRAVENOUS | Status: AC
  Administered 2017-12-08: 1140 mg via INTRAVENOUS
  Filled 2017-12-08: qty 57

## 2017-12-08 NOTE — Patient Instructions (Signed)
[facility] Discharge Instructions for Patients Receiving Chemotherapy  Today you received the following chemotherapy agents Cytoxan, Methotrexate, 5FU  To help prevent nausea and vomiting after your treatment, we encourage you to take your nausea medication as prescribed.   If you develop nausea and vomiting that is not controlled by your nausea medication, call the clinic.   BELOW ARE SYMPTOMS THAT SHOULD BE REPORTED IMMEDIATELY:  *FEVER GREATER THAN 100.5 F  *CHILLS WITH OR WITHOUT FEVER  NAUSEA AND VOMITING THAT IS NOT CONTROLLED WITH YOUR NAUSEA MEDICATION  *UNUSUAL SHORTNESS OF BREATH  *UNUSUAL BRUISING OR BLEEDING  TENDERNESS IN MOUTH AND THROAT WITH OR WITHOUT PRESENCE OF ULCERS  *URINARY PROBLEMS  *BOWEL PROBLEMS  UNUSUAL RASH Items with * indicate a potential emergency and should be followed up as soon as possible.  Feel free to call the clinic should you have any questions or concerns. The clinic phone number is [phone].  Please show the [facility] at check-in to the Emergency Department and triage nurse.

## 2017-12-08 NOTE — Progress Notes (Signed)
Okay to treat with ANC 1.4 per Dr.Magrinat.

## 2017-12-14 ENCOUNTER — Other Ambulatory Visit: Payer: Self-pay | Admitting: *Deleted

## 2017-12-14 DIAGNOSIS — Z95828 Presence of other vascular implants and grafts: Secondary | ICD-10-CM

## 2017-12-14 NOTE — Progress Notes (Signed)
[place] [facility]  [phone]) [phone] [phone]) [phone]     ID: Cynda Soule DOB: Nov 04, 1940  MR#: [id number]  XTK#:[id number]  Patient Care Team: Alroy Dust, Carlean Jews.Marlou Sa, MD as PCP - General (Family Medicine) Jovita Kussmaul, MD as Consulting Physician (General Surgery) Magrinat, Virgie Dad, MD as Consulting Physician (Oncology) Kyung Rudd, MD as Consulting Physician (Radiation Oncology) OTHER MD:  CHIEF COMPLAINT: Estrogen receptor positive breast cancer  CURRENT TREATMENT: Adjuvant chemotherapy   HISTORY OF CURRENT ILLNESS: On the original intake note:  Ethleen Lormand had routine screening mammography on 06/14/2017 showing a possible area of calcifications in the right breast. She underwent unilateral right diagnostic mammography with tomography at [facility] on 06/23/2017 showing: Suspicious calcifications within the inferior right breast, spanning nearly 10 cm, more superior component spanning 4 cm extent. The more superior component may have a small associated mass. Ultrasonography of the right axilla on 07/01/2017 showed normal right axillary lymph nodes.   Accordingly on 06/29/2017 she proceeded to biopsy of the right breast area in question. The pathology from this procedure showed ([id number]): In the right breast lower central, more lateral superior: invasive ductal carcinoma grade I. In the lower central, more medial inferior: Ductal carcinoma in situ, high grade, with calcifications. The  invasive tumor was significant for estrogen receptor, 100% positive and progesterone receptor, 10% positive, both with strong staining intensity. Proliferation marker Ki67 at 5%. HER2  not amplified. The ductal carcinoma in situ was significant for estrogen receptor, 100% positive and progesterone receptor, 80% positive, both with strong staining intensity.   The patient's subsequent history is as detailed below.  INTERVAL HISTORY: Kerston returns today for follow up and  treatment of her estrogen receptor positive breast cancer. She receives adjuvant chemotherapy with CMF given every 21 days for 8 planned cycles. Today is day 8 cycle 2. She tolerated this well. She denies feeling nauseated or vomiting. She felt tired on day 3. She has a sore on the left inside of the lower lips. She has some constipated, which she aids with fruit and a Miralax-like stool softener.    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 She denies unusual headaches, visual changes, nausea, vomiting, or dizziness. There has been no unusual cough, phlegm production, or pleurisy. There has been no change in bowel or bladder habits. She denies unexplained fatigue or unexplained weight loss, bleeding, rash, or fever. A detailed review of systems was otherwise stable.    PAST MEDICAL HISTORY: Past Medical History:  Diagnosis Date  . Anxiety   . Breast cancer, right breast ([place])   . Complication of anesthesia    difficult to wake - many years ago with tubal 50 years ago  . Dermatitis   . Family history of adverse reaction to anesthesia    "I think my sister was hard to wake up" (09/24/2017)  . Family history of breast cancer 07/12/2017  . Family history of prostate cancer 07/12/2017  . Genetic testing   . Palpitations    when she lays down, has not seen anyone for this, feels like a flutter   HLD ( but not too high)   PAST SURGICAL HISTORY: Past Surgical History:  Procedure Laterality Date  . BREAST BIOPSY Right 06/2017  . BREAST RECONSTRUCTION WITH PLACEMENT OF TISSUE EXPANDER AND ALLODERM Right 09/24/2017  . BREAST RECONSTRUCTION WITH PLACEMENT OF TISSUE EXPANDER AND ALLODERM Right 09/24/2017   Procedure: BREAST RECONSTRUCTION WITH PLACEMENT OF TISSUE  EXPANDER AND ALLODERM;  Surgeon: Irene Limbo, MD;  Location: [facility];  Service: Plastics;  Laterality: Right;  . CARPAL TUNNEL RELEASE Bilateral   . COLONOSCOPY    . MASTECTOMY COMPLETE  / SIMPLE W/ SENTINEL NODE BIOPSY Right 09/24/2017  . MASTECTOMY W/ SENTINEL NODE BIOPSY Right 09/24/2017   Procedure: MASTECTOMY WITH SENTINEL LYMPH NODE BIOPSY;  Surgeon: Jovita Kussmaul, MD;  Location: [facility];  Service: General;  Laterality: Right;  . PORTACATH PLACEMENT Left 11/08/2017   Procedure: INSERTION PORT-A-CATH;  Surgeon: Jovita Kussmaul, MD;  Location: [facility];  Service: General;  Laterality: Left;  . TUBAL LIGATION    . VAGINAL HYSTERECTOMY     Partial Hysterectomy without BSO. Because they were too close to the bladder.    FAMILY HISTORY Family History  Problem Relation Age of Onset  . Breast cancer Paternal Aunt   . Breast cancer Cousin 49  . Prostate cancer Father 66  . Breast cancer Cousin 92  . Breast cancer Cousin 25  . Breast cancer Cousin 38  . Breast cancer Other    The patient's father died at age 54 due to emphysema. The patient's mother is alive at age 66 as of March 2019. The patient has 5 brothers and 3 sisters. There was a paternal great aunt with breast cancer. There was also a paternal cousin with breast cancer.   GYNECOLOGIC HISTORY:  No LMP recorded. Patient has had a hysterectomy. Menarche: 77 years old Age at first live birth: 77 years old The patient is GXP3. She is s/p partial hysterectomy. Both of her ovaries remain. She used oral contraceptives with no complications. She also used HRT for 2 years without complications.     SOCIAL HISTORY:  Armiyah is now retired. 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 She is divorced. Her oldest daughter, [name]", is a [employer]th Land in [place], [place]. The patient's son, [name], works in a Proofreader in [place]. The patient's son, [name], works in Quarry manager in [place].  The patient has 4 grandchildren. She currently does not belong to a church.      ADVANCED DIRECTIVES: Not in place.  At the 07/07/2017 visit the patient was given the appropriate documents  to complete and notarized at her discretion.  HEALTH MAINTENANCE: Social History   Tobacco Use  . Smoking status: Former Smoker    Years: 1.00    Types: Cigarettes  . Smokeless tobacco: Never Used  . Tobacco comment: Smoked X 1 year at age 77 (less than ppd)  Substance Use Topics  . Alcohol use: Not Currently  . Drug use: Never     Colonoscopy: 02/2017 at [facility]  PAP:   Bone density: 2018/ osteopenia   No Known Allergies  Current Outpatient Medications  Medication Sig Dispense Refill  . ALPRAZolam (XANAX) 0.25 MG tablet Take 0.125 mg by mouth 2 (two) times daily as needed for anxiety.     Marland Kitchen aspirin 325 MG tablet Take 325 mg by mouth daily.    Marland Kitchen BETA CAROTENE PO Take 7,500 mcg by mouth daily.    . calcium carbonate (CALCIUM 600) 600 MG TABS tablet Take 600 mg by mouth daily.    . Cholecalciferol (VITAMIN D-3) 5000 units TABS Take 1 tablet by mouth daily.    . citalopram (CELEXA) 10 MG tablet Take 10 mg by mouth daily.  0  . lidocaine-prilocaine (EMLA) cream Apply 1 application topically as needed.  30 g 1  . Lutein 20 MG CAPS Take 20 mg by mouth daily.     . Magnesium Citrate 100 MG TABS Take 100 mg by mouth daily.     . Multiple Vitamins-Minerals (ICAPS AREDS FORMULA PO) Take 1 capsule by mouth 3 (three) times a week.     . Probiotic Product (PROBIOTIC DAILY PO) Take 1 capsule by mouth 3 (three) times a week.     . prochlorperazine (COMPAZINE) 10 MG tablet Take 1 tablet (10 mg total) by mouth every 6 (six) hours as needed for nausea or vomiting. 30 tablet 0  . Red Yeast Rice 600 MG CAPS Take 600 mg by mouth daily.     Marland Kitchen VITAMIN E PO Take 180 mg by mouth daily.    Marland Kitchen zinc gluconate 50 MG tablet Take 50 mg by mouth daily as needed (cold prevention).     No current facility-administered medications for this visit.     OBJECTIVE: Middle-aged white woman in no acute distress  There were no vitals filed for this visit.   There is no height or weight on file to calculate  BMI.   Wt Readings from Last 3 Encounters:  11/29/17 178 lb 4.8 oz (80.9 kg)  11/16/17 175 lb 4.8 oz (79.5 kg)  11/08/17 173 lb (78.5 kg)      ECOG FS:1 - Symptomatic but completely ambulatory  Sclerae unicteric, EOMs intact Oropharynx clear and moist No cervical or supraclavicular adenopathy Lungs no rales or rhonchi Heart regular rate and rhythm Abd soft, nontender, positive bowel sounds MSK no focal spinal tenderness, no upper extremity lymphedema Neuro: nonfocal, well oriented, appropriate affect Breasts: Deferred  LAB RESULTS:  CMP     Component Value Date/Time   NA 141 12/08/2017 1143   K 3.9 12/08/2017 1143   CL 104 12/08/2017 1143   CO2 28 12/08/2017 1143   GLUCOSE 165 (H) 12/08/2017 1143   BUN 13 12/08/2017 1143   CREATININE 0.78 12/08/2017 1143   CALCIUM 9.3 12/08/2017 1143   PROT 6.7 12/08/2017 1143   ALBUMIN 3.7 12/08/2017 1143   AST 21 12/08/2017 1143   ALT 18 12/08/2017 1143   ALKPHOS 92 12/08/2017 1143   BILITOT 0.2 (L) 12/08/2017 1143   GFRNONAA >60 12/08/2017 1143   GFRAA >60 12/08/2017 1143    No results found for: TOTALPROTELP, ALBUMINELP, A1GS, A2GS, BETS, BETA2SER, GAMS, MSPIKE, SPEI  No results found for: KPAFRELGTCHN, LAMBDASER, KAPLAMBRATIO  Lab Results  Component Value Date   WBC 3.1 (L) 12/08/2017   NEUTROABS 1.4 (L) 12/08/2017   HGB 12.6 12/08/2017   HCT 37.6 12/08/2017   MCV 94.9 12/08/2017   PLT 253 12/08/2017    '@LASTCHEMISTRY'$ @  No results found for: LABCA2  No components found for: XKGYJE563  No results for input(s): INR in the last 168 hours.  No results found for: LABCA2  No results found for: JSH702  No results found for: OVZ858  No results found for: IFO277  No results found for: CA2729  No components found for: HGQUANT  No results found for: CEA1 / No results found for: CEA1   No results found for: AFPTUMOR  No results found for: CHROMOGRNA  No results found for: PSA1  No visits with results within  3 Day(s) from this visit.  Latest known visit with results is:  Appointment on 12/08/2017  Component Date Value Ref Range Status  . Sodium 12/08/2017 141  135 - 145 mmol/L Final  . Potassium 12/08/2017 3.9  3.5 -  5.1 mmol/L Final  . Chloride 12/08/2017 104  98 - 111 mmol/L Final  . CO2 12/08/2017 28  22 - 32 mmol/L Final  . Glucose, Bld 12/08/2017 165* 70 - 99 mg/dL Final  . BUN 12/08/2017 13  8 - 23 mg/dL Final  . Creatinine 12/08/2017 0.78  0.44 - 1.00 mg/dL Final  . Calcium 12/08/2017 9.3  8.9 - 10.3 mg/dL Final  . Total Protein 12/08/2017 6.7  6.5 - 8.1 g/dL Final  . Albumin 12/08/2017 3.7  3.5 - 5.0 g/dL Final  . AST 12/08/2017 21  15 - 41 U/L Final  . ALT 12/08/2017 18  0 - 44 U/L Final  . Alkaline Phosphatase 12/08/2017 92  38 - 126 U/L Final  . Total Bilirubin 12/08/2017 0.2* 0.3 - 1.2 mg/dL Final  . GFR, Est Non Af Am 12/08/2017 >60  >60 mL/min Final  . GFR, Est AFR Am 12/08/2017 >60  >60 mL/min Final   Comment: (NOTE) The eGFR has been calculated using the CKD EPI equation. This calculation has not been validated in all clinical situations. eGFR's persistently <60 mL/min signify possible Chronic Kidney Disease.   Georgiann Hahn gap 12/08/2017 9  5 - 15 Final   Performed at [facility], [place] [address], [place], [place] [address]  . WBC Count 12/08/2017 3.1* 3.9 - 10.3 K/uL Final  . RBC 12/08/2017 3.96  3.70 - 5.45 MIL/uL Final  . Hemoglobin 12/08/2017 12.6  11.6 - 15.9 g/dL Final  . HCT 12/08/2017 37.6  34.8 - 46.6 % Final  . MCV 12/08/2017 94.9  79.5 - 101.0 fL Final  . MCH 12/08/2017 31.7  25.1 - 34.0 pg Final  . MCHC 12/08/2017 33.4  31.5 - 36.0 g/dL Final  . RDW 12/08/2017 13.3  11.2 - 14.5 % Final  . Platelet Count 12/08/2017 253  145 - 400 K/uL Final  . Neutrophils Relative % 12/08/2017 46  % Final  . Neutro Abs 12/08/2017 1.4* 1.5 - 6.5 K/uL Final  . Lymphocytes Relative 12/08/2017 42  % Final  . Lymphs Abs 12/08/2017 1.3  0.9 - 3.3 K/uL  Final  . Monocytes Relative 12/08/2017 10  % Final  . Monocytes Absolute 12/08/2017 0.3  0.1 - 0.9 K/uL Final  . Eosinophils Relative 12/08/2017 1  % Final  . Eosinophils Absolute 12/08/2017 0.0  0.0 - 0.5 K/uL Final  . Basophils Relative 12/08/2017 1  % Final  . Basophils Absolute 12/08/2017 0.0  0.0 - 0.1 K/uL Final   Performed at [facility], [place] [address] [place], [place] [address]    (this displays the last labs from the last 3 days)  No results found for: TOTALPROTELP, ALBUMINELP, A1GS, A2GS, BETS, BETA2SER, GAMS, MSPIKE, SPEI (this displays SPEP labs)  No results found for: KPAFRELGTCHN, LAMBDASER, KAPLAMBRATIO (kappa/lambda light chains)  No results found for: HGBA, HGBA2QUANT, HGBFQUANT, HGBSQUAN (Hemoglobinopathy evaluation)   No results found for: LDH  No results found for: IRON, TIBC, IRONPCTSAT (Iron and TIBC)  No results found for: FERRITIN  Urinalysis No results found for: COLORURINE, APPEARANCEUR, LABSPEC, PHURINE, GLUCOSEU, HGBUR, BILIRUBINUR, KETONESUR, PROTEINUR, UROBILINOGEN, NITRITE, LEUKOCYTESUR   STUDIES: No results found.  ELIGIBLE FOR AVAILABLE RESEARCH PROTOCOL: exact sciences study  ASSESSMENT: 77 y.o. [place], [place] woman status post right breast biopsy 07/01/2017 for a clinical mT2 N0, stage IB invasive ductal carcinoma, grade 1, estrogen and progesterone receptor positive, HER-2 nonamplified, with an MIB-105%  (1) a second area in the right breast biopsied  07/01/2017 showed ductal carcinoma in situ, grade 3, estrogen and progesterone receptor positive.  (2) status post right mastectomy and sentinel lymph node sampling 09/24/2017 for a pT1b pN0, stage IA invasive ductal carcinoma, grade 1, with negative margins  (a) a total of 5 sentinel lymph nodes were removed  (3) The Oncotype DX score was 28, predicting a risk of outside the breast recurrence over the next 9 years of 17% if the patient's only systemic therapy is  tamoxifen for 5 years.  It also predicts a significant benefit from chemotherapy.  (4) cyclophosphamide, methotrexate and fluorouracil (CMF) chemotherapy started 11/17/2017, repeated every 21 days x 8  (5) anastrozole started 08/04/2016, interrupted during chemotherapy  (6) genetics testing 07/22/2017 through the Common Hereditary Cancer Panel offered by [facility] found no deleterious mutations in APC, ATM, AXIN2, BARD1, BMPR1A, BRCA1, BRCA2, BRIP1, CDH1, CDKN2A (p14ARF), CDKN2A (p16INK4a), CKD4, CHEK2, CTNNA1, DICER1, EPCAM (Deletion/duplication testing only), GREM1 (promoter region deletion/duplication testing only), KIT, MEN1, MLH1, MSH2, MSH3, MSH6, MUTYH, NBN, NF1, NHTL1, PALB2, PDGFRA, PMS2, POLD1, POLE, PTEN, RAD50, RAD51C, RAD51D, SDHB, SDHC, SDHD, SMAD4, SMARCA4. STK11, TP53, TSC1, TSC2, and VHL.  The following genes were evaluated for sequence changes only: SDHA and HOXB13 c.251G>A variant only.   (a) a variant of uncertain significance in the gene DICER1 was identified.  c.2116+6G>A (Intronic)   PLAN: Dean continues to do very well with her chemotherapy.  She has lost a little bit of hair but this is not noticeable.  She had minimal fatigue.  Overall she could not be tolerating treatment any better.  We are going to continue to see her on day 8 or 9 after each cycle but not on the day of treatment itself.  I did let her know that once she gets to cycle 6 7 and 8 she will probably feel a little bit more fatigued.  The way to fight that is to be more active now  I also alerted her that she will see my physician's assistant Mendel Ryder at the next 2 visits since I will not be available.  She knows to call for any other issues that may develop before the next visit  Magrinat, Virgie Dad, MD  12/14/17 1:31 PM Medical Oncology and Hematology [facility] [place], [place] [address] Tel. [phone]    Fax. [phone]  Alice Rieger, am acting as scribe  for Chauncey Cruel MD.    I, Lurline Del MD, have reviewed the above documentation for accuracy and completeness, and I agree with the above.

## 2017-12-15 ENCOUNTER — Inpatient Hospital Stay: Payer: Medicare HMO

## 2017-12-15 ENCOUNTER — Inpatient Hospital Stay: Payer: Medicare HMO | Attending: Oncology

## 2017-12-15 ENCOUNTER — Telehealth: Payer: Self-pay | Admitting: Oncology

## 2017-12-15 ENCOUNTER — Inpatient Hospital Stay: Payer: Medicare HMO | Admitting: Oncology

## 2017-12-15 VITALS — BP 123/63 | HR 79 | Temp 98.0°F | Resp 18 | Ht 63.0 in | Wt 177.8 lb

## 2017-12-15 DIAGNOSIS — F419 Anxiety disorder, unspecified: Secondary | ICD-10-CM

## 2017-12-15 DIAGNOSIS — Z87891 Personal history of nicotine dependence: Secondary | ICD-10-CM

## 2017-12-15 DIAGNOSIS — Z170 Estrogen receptor positive status [ER+]: Principal | ICD-10-CM

## 2017-12-15 DIAGNOSIS — M8588 Other specified disorders of bone density and structure, other site: Secondary | ICD-10-CM | POA: Insufficient documentation

## 2017-12-15 DIAGNOSIS — C50311 Malignant neoplasm of lower-inner quadrant of right female breast: Secondary | ICD-10-CM

## 2017-12-15 DIAGNOSIS — Z9011 Acquired absence of right breast and nipple: Secondary | ICD-10-CM | POA: Diagnosis not present

## 2017-12-15 DIAGNOSIS — Z95828 Presence of other vascular implants and grafts: Secondary | ICD-10-CM

## 2017-12-15 DIAGNOSIS — Z5111 Encounter for antineoplastic chemotherapy: Secondary | ICD-10-CM | POA: Diagnosis not present

## 2017-12-15 LAB — CMP (CANCER CENTER ONLY)
ALBUMIN: 3.9 g/dL (ref 3.5–5.0)
ALK PHOS: 84 U/L (ref 38–126)
ALT: 13 U/L (ref 0–44)
ANION GAP: 9 (ref 5–15)
AST: 15 U/L (ref 15–41)
BILIRUBIN TOTAL: 0.4 mg/dL (ref 0.3–1.2)
BUN: 14 mg/dL (ref 8–23)
CALCIUM: 9.4 mg/dL (ref 8.9–10.3)
CO2: 27 mmol/L (ref 22–32)
Chloride: 102 mmol/L (ref 98–111)
Creatinine: 0.71 mg/dL (ref 0.44–1.00)
GFR, Estimated: >60 mL/min (ref >60)
GLUCOSE: 101 mg/dL — AB (ref 70–99)
Potassium: 4.3 mmol/L (ref 3.5–5.1)
Sodium: 138 mmol/L (ref 135–145)
TOTAL PROTEIN: 6.9 g/dL (ref 6.5–8.1)

## 2017-12-15 LAB — CBC WITH DIFFERENTIAL (CANCER CENTER ONLY)
Basophils Absolute: 0 10*3/uL (ref 0.0–0.1)
Basophils Relative: 0 %
Eosinophils Absolute: 0 10*3/uL (ref 0.0–0.5)
Eosinophils Relative: 1 %
HEMATOCRIT: 36.9 % (ref 34.8–46.6)
HEMOGLOBIN: 12.1 g/dL (ref 11.6–15.9)
LYMPHS ABS: 1.2 10*3/uL (ref 0.9–3.3)
LYMPHS PCT: 39 %
MCH: 31.5 pg (ref 25.1–34.0)
MCHC: 32.8 g/dL (ref 31.5–36.0)
MCV: 96.1 fL (ref 79.5–101.0)
MONOS PCT: 2 %
Monocytes Absolute: 0.1 10*3/uL (ref 0.1–0.9)
NEUTROS ABS: 1.7 10*3/uL (ref 1.5–6.5)
NEUTROS PCT: 58 %
Platelet Count: 150 10*3/uL (ref 145–400)
RBC: 3.84 MIL/uL (ref 3.70–5.45)
RDW: 12.8 % (ref 11.2–14.5)
WBC Count: 3 10*3/uL — ABNORMAL LOW (ref 3.9–10.3)

## 2017-12-15 MED ORDER — HEPARIN SOD (PORK) LOCK FLUSH 100 UNIT/ML IV SOLN
500.0000 [IU] | Freq: Once | INTRAVENOUS | Status: AC
  Administered 2017-12-15: 500 [IU]
  Filled 2017-12-15: qty 5

## 2017-12-15 MED ORDER — SODIUM CHLORIDE 0.9 % IJ SOLN
10.0000 mL | Freq: Once | INTRAMUSCULAR | Status: AC
  Administered 2017-12-15: 10 mL
  Filled 2017-12-15: qty 10

## 2017-12-15 NOTE — Telephone Encounter (Signed)
Gave patient avs and calendars.  °

## 2017-12-29 ENCOUNTER — Inpatient Hospital Stay: Payer: Medicare HMO

## 2017-12-29 VITALS — BP 117/68 | HR 88 | Temp 98.3°F | Resp 18 | Ht 63.0 in | Wt 180.2 lb

## 2017-12-29 DIAGNOSIS — Z95828 Presence of other vascular implants and grafts: Secondary | ICD-10-CM

## 2017-12-29 DIAGNOSIS — Z170 Estrogen receptor positive status [ER+]: Principal | ICD-10-CM

## 2017-12-29 DIAGNOSIS — C50311 Malignant neoplasm of lower-inner quadrant of right female breast: Secondary | ICD-10-CM

## 2017-12-29 DIAGNOSIS — Z9011 Acquired absence of right breast and nipple: Secondary | ICD-10-CM | POA: Diagnosis not present

## 2017-12-29 DIAGNOSIS — F419 Anxiety disorder, unspecified: Secondary | ICD-10-CM | POA: Diagnosis not present

## 2017-12-29 DIAGNOSIS — Z5111 Encounter for antineoplastic chemotherapy: Secondary | ICD-10-CM | POA: Diagnosis not present

## 2017-12-29 DIAGNOSIS — M8588 Other specified disorders of bone density and structure, other site: Secondary | ICD-10-CM | POA: Diagnosis not present

## 2017-12-29 DIAGNOSIS — Z87891 Personal history of nicotine dependence: Secondary | ICD-10-CM | POA: Diagnosis not present

## 2017-12-29 LAB — CBC WITH DIFFERENTIAL/PLATELET
Basophils Absolute: 0 10*3/uL (ref 0.0–0.1)
Basophils Relative: 1 %
Eosinophils Absolute: 0.1 10*3/uL (ref 0.0–0.5)
Eosinophils Relative: 2 %
HCT: 36.5 % (ref 34.8–46.6)
HEMOGLOBIN: 12.3 g/dL (ref 11.6–15.9)
LYMPHS ABS: 1.3 10*3/uL (ref 0.9–3.3)
LYMPHS PCT: 42 %
MCH: 32 pg (ref 25.1–34.0)
MCHC: 33.7 g/dL (ref 31.5–36.0)
MCV: 95 fL (ref 79.5–101.0)
Monocytes Absolute: 0.4 10*3/uL (ref 0.1–0.9)
Monocytes Relative: 13 %
NEUTROS ABS: 1.3 10*3/uL — AB (ref 1.5–6.5)
NEUTROS PCT: 42 %
Platelets: 285 10*3/uL (ref 145–400)
RBC: 3.84 MIL/uL (ref 3.70–5.45)
RDW: 13.4 % (ref 11.2–14.5)
WBC: 3.1 10*3/uL — ABNORMAL LOW (ref 3.9–10.3)

## 2017-12-29 LAB — COMPREHENSIVE METABOLIC PANEL
ALT: 12 U/L (ref 0–44)
ANION GAP: 7 (ref 5–15)
AST: 18 U/L (ref 15–41)
Albumin: 3.7 g/dL (ref 3.5–5.0)
Alkaline Phosphatase: 92 U/L (ref 38–126)
BUN: 11 mg/dL (ref 8–23)
CO2: 29 mmol/L (ref 22–32)
Calcium: 9.3 mg/dL (ref 8.9–10.3)
Chloride: 105 mmol/L (ref 98–111)
Creatinine, Ser: 0.72 mg/dL (ref 0.44–1.00)
GFR calc non Af Amer: >60 mL/min (ref >60)
Glucose, Bld: 98 mg/dL (ref 70–99)
POTASSIUM: 4.4 mmol/L (ref 3.5–5.1)
Sodium: 141 mmol/L (ref 135–145)
Total Bilirubin: 0.3 mg/dL (ref 0.3–1.2)
Total Protein: 6.8 g/dL (ref 6.5–8.1)

## 2017-12-29 MED ORDER — FLUOROURACIL CHEMO INJECTION 2.5 GM/50ML
600.0000 mg/m2 | Freq: Once | INTRAVENOUS | Status: AC
  Administered 2017-12-29: 1150 mg via INTRAVENOUS
  Filled 2017-12-29: qty 23

## 2017-12-29 MED ORDER — DEXAMETHASONE SODIUM PHOSPHATE 10 MG/ML IJ SOLN
10.0000 mg | Freq: Once | INTRAMUSCULAR | Status: AC
  Administered 2017-12-29: 10 mg via INTRAVENOUS

## 2017-12-29 MED ORDER — METHOTREXATE SODIUM (PF) CHEMO INJECTION 250 MG/10ML
39.7000 mg/m2 | Freq: Once | INTRAMUSCULAR | Status: AC
  Administered 2017-12-29: 75 mg via INTRAVENOUS
  Filled 2017-12-29: qty 3

## 2017-12-29 MED ORDER — SODIUM CHLORIDE 0.9% FLUSH
10.0000 mL | INTRAVENOUS | Status: DC | PRN
  Administered 2017-12-29: 10 mL
  Filled 2017-12-29: qty 10

## 2017-12-29 MED ORDER — SODIUM CHLORIDE 0.9 % IV SOLN
Freq: Once | INTRAVENOUS | Status: AC
  Administered 2017-12-29: 13:00:00 via INTRAVENOUS
  Filled 2017-12-29: qty 250

## 2017-12-29 MED ORDER — SODIUM CHLORIDE 0.9 % IV SOLN
600.0000 mg/m2 | Freq: Once | INTRAVENOUS | Status: AC
  Administered 2017-12-29: 1140 mg via INTRAVENOUS
  Filled 2017-12-29: qty 57

## 2017-12-29 MED ORDER — PALONOSETRON HCL INJECTION 0.25 MG/5ML
0.2500 mg | Freq: Once | INTRAVENOUS | Status: AC
  Administered 2017-12-29: 0.25 mg via INTRAVENOUS

## 2017-12-29 MED ORDER — SODIUM CHLORIDE 0.9% FLUSH
10.0000 mL | INTRAVENOUS | Status: DC | PRN
  Administered 2017-12-29: 10 mL via INTRAVENOUS
  Filled 2017-12-29: qty 10

## 2017-12-29 MED ORDER — DEXAMETHASONE SODIUM PHOSPHATE 10 MG/ML IJ SOLN
INTRAMUSCULAR | Status: AC
  Filled 2017-12-29: qty 1

## 2017-12-29 MED ORDER — PALONOSETRON HCL INJECTION 0.25 MG/5ML
INTRAVENOUS | Status: AC
  Filled 2017-12-29: qty 5

## 2017-12-29 MED ORDER — HEPARIN SOD (PORK) LOCK FLUSH 100 UNIT/ML IV SOLN
500.0000 [IU] | Freq: Once | INTRAVENOUS | Status: AC | PRN
  Administered 2017-12-29: 500 [IU]
  Filled 2017-12-29: qty 5

## 2017-12-29 NOTE — Patient Instructions (Signed)
[facility] Discharge Instructions for Patients Receiving Chemotherapy  Today you received the following chemotherapy agents: Cyclophosphamide (Cytoxan), Methotrexate (PF), and Fluorouracil (Adrucil, 5-FU)  To help prevent nausea and vomiting after your treatment, we encourage you to take your nausea medication as directed.    If you develop nausea and vomiting that is not controlled by your nausea medication, call the clinic.   BELOW ARE SYMPTOMS THAT SHOULD BE REPORTED IMMEDIATELY:  *FEVER GREATER THAN 100.5 F  *CHILLS WITH OR WITHOUT FEVER  NAUSEA AND VOMITING THAT IS NOT CONTROLLED WITH YOUR NAUSEA MEDICATION  *UNUSUAL SHORTNESS OF BREATH  *UNUSUAL BRUISING OR BLEEDING  TENDERNESS IN MOUTH AND THROAT WITH OR WITHOUT PRESENCE OF ULCERS  *URINARY PROBLEMS  *BOWEL PROBLEMS  UNUSUAL RASH Items with * indicate a potential emergency and should be followed up as soon as possible.  Feel free to call the clinic should you have any questions or concerns. The clinic phone number is [phone].  Please show the [facility] at check-in to the Emergency Department and triage nurse.

## 2017-12-29 NOTE — Progress Notes (Signed)
OK to treat with labs today per Mendel Ryder NP

## 2018-01-05 ENCOUNTER — Inpatient Hospital Stay: Payer: Medicare HMO | Admitting: Adult Health

## 2018-01-05 ENCOUNTER — Telehealth: Payer: Self-pay | Admitting: Adult Health

## 2018-01-05 ENCOUNTER — Encounter: Payer: Self-pay | Admitting: Adult Health

## 2018-01-05 ENCOUNTER — Inpatient Hospital Stay: Payer: Medicare HMO

## 2018-01-05 VITALS — BP 130/82 | HR 79 | Temp 98.3°F | Resp 17 | Ht 63.0 in | Wt 178.7 lb

## 2018-01-05 DIAGNOSIS — M8588 Other specified disorders of bone density and structure, other site: Secondary | ICD-10-CM | POA: Diagnosis not present

## 2018-01-05 DIAGNOSIS — Z5111 Encounter for antineoplastic chemotherapy: Secondary | ICD-10-CM | POA: Diagnosis not present

## 2018-01-05 DIAGNOSIS — Z170 Estrogen receptor positive status [ER+]: Secondary | ICD-10-CM

## 2018-01-05 DIAGNOSIS — Z87891 Personal history of nicotine dependence: Secondary | ICD-10-CM

## 2018-01-05 DIAGNOSIS — Z9011 Acquired absence of right breast and nipple: Secondary | ICD-10-CM | POA: Diagnosis not present

## 2018-01-05 DIAGNOSIS — C50311 Malignant neoplasm of lower-inner quadrant of right female breast: Secondary | ICD-10-CM

## 2018-01-05 DIAGNOSIS — F419 Anxiety disorder, unspecified: Secondary | ICD-10-CM | POA: Diagnosis not present

## 2018-01-05 LAB — CBC WITH DIFFERENTIAL/PLATELET
BASOS PCT: 1 %
Basophils Absolute: 0 10*3/uL (ref 0.0–0.1)
EOS ABS: 0 10*3/uL (ref 0.0–0.5)
EOS PCT: 1 %
HCT: 35 % (ref 34.8–46.6)
Hemoglobin: 11.5 g/dL — ABNORMAL LOW (ref 11.6–15.9)
LYMPHS ABS: 0.9 10*3/uL (ref 0.9–3.3)
Lymphocytes Relative: 38 %
MCH: 31.6 pg (ref 25.1–34.0)
MCHC: 32.9 g/dL (ref 31.5–36.0)
MCV: 96.2 fL (ref 79.5–101.0)
MONOS PCT: 4 %
Monocytes Absolute: 0.1 10*3/uL (ref 0.1–0.9)
Neutro Abs: 1.4 10*3/uL — ABNORMAL LOW (ref 1.5–6.5)
Neutrophils Relative %: 56 %
PLATELETS: 139 10*3/uL — AB (ref 145–400)
RBC: 3.64 MIL/uL — AB (ref 3.70–5.45)
RDW: 13.2 % (ref 11.2–14.5)
WBC: 2.4 10*3/uL — AB (ref 3.9–10.3)

## 2018-01-05 LAB — COMPREHENSIVE METABOLIC PANEL
ALT: 14 U/L (ref 0–44)
AST: 14 U/L — AB (ref 15–41)
Albumin: 3.6 g/dL (ref 3.5–5.0)
Alkaline Phosphatase: 74 U/L (ref 38–126)
Anion gap: 8 (ref 5–15)
BUN: 11 mg/dL (ref 8–23)
CALCIUM: 8.9 mg/dL (ref 8.9–10.3)
CHLORIDE: 102 mmol/L (ref 98–111)
CO2: 28 mmol/L (ref 22–32)
Creatinine, Ser: 0.69 mg/dL (ref 0.44–1.00)
GFR calc Af Amer: >60 mL/min (ref >60)
Glucose, Bld: 123 mg/dL — ABNORMAL HIGH (ref 70–99)
Potassium: 4.2 mmol/L (ref 3.5–5.1)
Sodium: 138 mmol/L (ref 135–145)
Total Bilirubin: 0.5 mg/dL (ref 0.3–1.2)
Total Protein: 6.4 g/dL — ABNORMAL LOW (ref 6.5–8.1)

## 2018-01-05 NOTE — Progress Notes (Signed)
[place]  [phone]) [phone] [phone]) [phone]     ID: Lauren Holt DOB: [birth year]  MR#: [id number]  GYF#:[id number]  Patient Care Team: Alroy Dust, Carlean Jews.Marlou Sa, MD as PCP - General (Family Medicine) Jovita Kussmaul, MD as Consulting Physician (General Surgery) Magrinat, Virgie Dad, MD as Consulting Physician (Oncology) Kyung Rudd, MD as Consulting Physician (Radiation Oncology) OTHER MD:  CHIEF COMPLAINT: Estrogen receptor positive breast cancer  CURRENT TREATMENT: Adjuvant chemotherapy   HISTORY OF CURRENT ILLNESS: On the original intake note:  Lauren Holt had routine screening mammography on 06/14/2017 showing a possible area of calcifications in the right breast. She underwent unilateral right diagnostic mammography with tomography at [facility] on 06/23/2017 showing: Suspicious calcifications within the inferior right breast, spanning nearly 10 cm, more superior component spanning 4 cm extent. The more superior component may have a small associated mass. Ultrasonography of the right axilla on 07/01/2017 showed normal right axillary lymph nodes.   Accordingly on 06/29/2017 she proceeded to biopsy of the right breast area in question. The pathology from this procedure showed ([id number]): In the right breast lower central, more lateral superior: invasive ductal carcinoma grade I. In the lower central, more medial inferior: Ductal carcinoma in situ, high grade, with calcifications. The  invasive tumor was significant for estrogen receptor, 100% positive and progesterone receptor, 10% positive, both with strong staining intensity. Proliferation marker Ki67 at 5%. HER2  not amplified. The ductal carcinoma in situ was significant for estrogen receptor, 100% positive and progesterone receptor, 80% positive, both with strong staining intensity.   The patient's subsequent history is as detailed below.  INTERVAL HISTORY: Lauren Holt returns Holt for follow up and  treatment of her estrogen receptor positive breast cancer. She receives adjuvant chemotherapy with CMF given every 21 days for 8 planned cycles. Holt is day 8 cycle 3.   REVIEW OF SYSTEMS: Lauren Holt.  She continues to tolerate treatment well.  She isn't nauseated.  She does note a pain in her LLQ that has been present for 2-3 years.  She has undergone transvaginal ultrasound to evaluated.  She also underwent colonoscopy last year.  She denies any blood in her stool or black tarry stool.   She is constipated slightly since starting chemotherapy.  She isn't taking anything for it.  She is fearful of experiencing diarrhea.  Otherwise Lauren Holt.  A detailed ROS was otherwise non contributory.    PAST MEDICAL HISTORY: Past Medical History:  Diagnosis Date  . Anxiety   . Breast cancer, right breast ([place])   . Complication of anesthesia    difficult to wake - many years ago with tubal 50 years ago  . Dermatitis   . Family history of adverse reaction to anesthesia    "I think my sister was hard to wake up" (09/24/2017)  . Family history of breast cancer 07/12/2017  . Family history of prostate cancer 07/12/2017  . Genetic testing   . Palpitations    when she lays down, has not seen anyone for this, feels like a flutter   HLD ( but not too high)   PAST SURGICAL HISTORY: Past Surgical History:  Procedure Laterality Date  . BREAST BIOPSY Right 06/2017  . BREAST RECONSTRUCTION WITH PLACEMENT OF TISSUE EXPANDER AND ALLODERM Right 09/24/2017  . BREAST RECONSTRUCTION WITH PLACEMENT OF TISSUE EXPANDER AND ALLODERM Right 09/24/2017   Procedure: BREAST RECONSTRUCTION WITH PLACEMENT OF TISSUE EXPANDER AND ALLODERM;  Surgeon: Irene Limbo, MD;  Location: [facility]  OR;  Service: Clinical cytogeneticist;  Laterality: Right;  . CARPAL TUNNEL RELEASE Bilateral   . COLONOSCOPY    . MASTECTOMY COMPLETE / SIMPLE W/ SENTINEL NODE BIOPSY Right 09/24/2017  . MASTECTOMY W/ SENTINEL NODE BIOPSY  Right 09/24/2017   Procedure: MASTECTOMY WITH SENTINEL LYMPH NODE BIOPSY;  Surgeon: Jovita Kussmaul, MD;  Location: [facility];  Service: General;  Laterality: Right;  . PORTACATH PLACEMENT Left 11/08/2017   Procedure: INSERTION PORT-A-CATH;  Surgeon: Jovita Kussmaul, MD;  Location: [facility];  Service: General;  Laterality: Left;  . TUBAL LIGATION    . VAGINAL HYSTERECTOMY     Partial Hysterectomy without BSO. Because they were too close to the bladder.    FAMILY HISTORY Family History  Problem Relation Age of Onset  . Breast cancer Paternal Aunt   . Breast cancer Cousin 69  . Prostate cancer Father 56  . Breast cancer Cousin 75  . Breast cancer Cousin 25  . Breast cancer Cousin 46  . Breast cancer Other    The patient's father died at age 39 due to emphysema. The patient's mother is alive at age 58 as of March 2019. The patient has 5 brothers and 3 sisters. There was a paternal great aunt with breast cancer. There was also a paternal cousin with breast cancer.   GYNECOLOGIC HISTORY:  No LMP recorded. Patient has had a hysterectomy. Menarche: 77 years old Age at first live birth: 77 years old The patient is GXP3. She is s/p partial hysterectomy. Both of her ovaries remain. She used oral contraceptives with no complications. She also used HRT for 2 years without complications.     SOCIAL HISTORY:  Lauren Holt is now retired. 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 She is divorced. Her oldest daughter, Lauren Holt", is a 5th Land in [place], [place]. The patient's son, Lauren Holt, works in a Proofreader in [place]. The patient's son, Lauren Holt, works in Quarry manager in [place].  The patient has 4 grandchildren. She currently does not belong to a church.      ADVANCED DIRECTIVES: Not in place.  At the 07/07/2017 visit the patient was given the appropriate documents to complete and notarized at her discretion.  HEALTH MAINTENANCE: Social History    Tobacco Use  . Smoking status: Former Smoker    Years: 1.00    Types: Cigarettes  . Smokeless tobacco: Never Used  . Tobacco comment: Smoked X 1 year at age 31 (less than ppd)  Substance Use Topics  . Alcohol use: Not Currently  . Drug use: Never     Colonoscopy: 02/2017 at [facility]  PAP:   Bone density: 2018/ osteopenia   No Known Allergies  Current Outpatient Medications  Medication Sig Dispense Refill  . ALPRAZolam (XANAX) 0.25 MG tablet Take 0.125 mg by mouth 2 (two) times daily as needed for anxiety.     Marland Kitchen aspirin 325 MG tablet Take 325 mg by mouth daily.    Marland Kitchen BETA CAROTENE PO Take 7,500 mcg by mouth daily.    . calcium carbonate (CALCIUM 600) 600 MG TABS tablet Take 600 mg by mouth daily.    . Cholecalciferol (VITAMIN D-3) 5000 units TABS Take 1 tablet by mouth daily.    Marland Kitchen lidocaine-prilocaine (EMLA) cream Apply 1 application topically as needed. 30 g 1  . Lutein 20 MG CAPS Take 20 mg by mouth daily.     . Magnesium Citrate 100 MG TABS Take  100 mg by mouth daily.     . Multiple Vitamins-Minerals (ICAPS AREDS FORMULA PO) Take 1 capsule by mouth 3 (three) times a week.     . Probiotic Product (PROBIOTIC DAILY PO) Take 1 capsule by mouth 3 (three) times a week.     . prochlorperazine (COMPAZINE) 10 MG tablet Take 1 tablet (10 mg total) by mouth every 6 (six) hours as needed for nausea or vomiting. 30 tablet 0  . Red Yeast Rice 600 MG CAPS Take 600 mg by mouth daily.     Marland Kitchen VITAMIN E PO Take 180 mg by mouth daily.    Marland Kitchen zinc gluconate 50 MG tablet Take 50 mg by mouth daily as needed (cold prevention).     No current facility-administered medications for this visit.     OBJECTIVE:  Vitals:   01/05/18 1010  BP: 130/82  Pulse: 79  Resp: 17  Temp: 98.3 F (36.8 C)  SpO2: 99%     Body mass index is 31.66 kg/m.   Wt Readings from Last 3 Encounters:  01/05/18 178 lb 11.2 oz (81.1 kg)  12/29/17 180 lb 4 oz (81.8 kg)  12/15/17 177 lb 12.8 oz (80.6 kg)  ECOG FS:1  - Symptomatic but completely ambulatory GENERAL: Patient is a well appearing female in no acute distress HEENT:  Sclerae anicteric.  Oropharynx clear and moist. No ulcerations or evidence of oropharyngeal candidiasis. Neck is supple.  NODES:  No cervical, supraclavicular, or axillary lymphadenopathy palpated.  BREAST EXAM:  Right breast s/p mastectomy and implant placement, healing well, no sign of recurrence, left breast is benign LUNGS:  Clear to auscultation bilaterally.  No wheezes or rhonchi. HEART:  Regular rate and rhythm. No murmur appreciated. ABDOMEN:  Soft, nontender.  Positive, normoactive bowel sounds. No organomegaly palpated. MSK:  No focal spinal tenderness to palpation. Full range of motion bilaterally in the upper extremities. EXTREMITIES:  No peripheral edema.   SKIN:  Clear with no obvious rashes or skin changes. No nail dyscrasia. NEURO:  Nonfocal. Well oriented.  Appropriate affect.   LAB RESULTS:  CMP     Component Value Date/Time   NA 138 01/05/2018 0930   K 4.2 01/05/2018 0930   CL 102 01/05/2018 0930   CO2 28 01/05/2018 0930   GLUCOSE 123 (H) 01/05/2018 0930   BUN 11 01/05/2018 0930   CREATININE 0.69 01/05/2018 0930   CREATININE 0.71 12/15/2017 1049   CALCIUM 8.9 01/05/2018 0930   PROT 6.4 (L) 01/05/2018 0930   ALBUMIN 3.6 01/05/2018 0930   AST 14 (L) 01/05/2018 0930   AST 15 12/15/2017 1049   ALT 14 01/05/2018 0930   ALT 13 12/15/2017 1049   ALKPHOS 74 01/05/2018 0930   BILITOT 0.5 01/05/2018 0930   BILITOT 0.4 12/15/2017 1049   GFRNONAA >60 01/05/2018 0930   GFRNONAA >60 12/15/2017 1049   GFRAA >60 01/05/2018 0930   GFRAA >60 12/15/2017 1049    No results found for: TOTALPROTELP, ALBUMINELP, A1GS, A2GS, BETS, BETA2SER, GAMS, MSPIKE, SPEI  No results found for: KPAFRELGTCHN, LAMBDASER, KAPLAMBRATIO  Lab Results  Component Value Date   WBC 2.4 (L) 01/05/2018   NEUTROABS 1.4 (L) 01/05/2018   HGB 11.5 (L) 01/05/2018   HCT 35.0 01/05/2018    MCV 96.2 01/05/2018   PLT 139 (L) 01/05/2018    _0 @  No results found for: LABCA2  No components found for: GHWEXH371  No results for input(s): INR in the last 168 hours.  No results found for: LABCA2  No results found for: LGX211  No results found for: HER740  No results found for: CXK481  No results found for: CA2729  No components found for: HGQUANT  No results found for: CEA1 / No results found for: CEA1   No results found for: AFPTUMOR  No results found for: CHROMOGRNA  No results found for: PSA1  Appointment on 01/05/2018  Component Date Value Ref Range Status  . Sodium 01/05/2018 138  135 - 145 mmol/L Final  . Potassium 01/05/2018 4.2  3.5 - 5.1 mmol/L Final  . Chloride 01/05/2018 102  98 - 111 mmol/L Final  . CO2 01/05/2018 28  22 - 32 mmol/L Final  . Glucose, Bld 01/05/2018 123* 70 - 99 mg/dL Final  . BUN 01/05/2018 11  8 - 23 mg/dL Final  . Creatinine, Ser 01/05/2018 0.69  0.44 - 1.00 mg/dL Final  . Calcium 01/05/2018 8.9  8.9 - 10.3 mg/dL Final  . Total Protein 01/05/2018 6.4* 6.5 - 8.1 g/dL Final  . Albumin 01/05/2018 3.6  3.5 - 5.0 g/dL Final  . AST 01/05/2018 14* 15 - 41 U/L Final  . ALT 01/05/2018 14  0 - 44 U/L Final  . Alkaline Phosphatase 01/05/2018 74  38 - 126 U/L Final  . Total Bilirubin 01/05/2018 0.5  0.3 - 1.2 mg/dL Final  . GFR calc non Af Amer 01/05/2018 >60  >60 mL/min Final  . GFR calc Af Amer 01/05/2018 >60  >60 mL/min Final   Comment: (NOTE) The eGFR has been calculated using the CKD EPI equation. This calculation has not been validated in all clinical situations. eGFR's persistently <60 mL/min signify possible Chronic Kidney Disease.   Georgiann Hahn gap 01/05/2018 8  5 - 15 Final   Performed at [facility], [place] [address], [place], [place] [address]  . WBC 01/05/2018 2.4* 3.9 - 10.3 K/uL Final  . RBC 01/05/2018 3.64* 3.70 - 5.45 MIL/uL Final  . Hemoglobin 01/05/2018 11.5* 11.6 - 15.9 g/dL Final    . HCT 01/05/2018 35.0  34.8 - 46.6 % Final  . MCV 01/05/2018 96.2  79.5 - 101.0 fL Final  . MCH 01/05/2018 31.6  25.1 - 34.0 pg Final  . MCHC 01/05/2018 32.9  31.5 - 36.0 g/dL Final  . RDW 01/05/2018 13.2  11.2 - 14.5 % Final  . Platelets 01/05/2018 139* 145 - 400 K/uL Final  . Neutrophils Relative % 01/05/2018 56  % Final  . Neutro Abs 01/05/2018 1.4* 1.5 - 6.5 K/uL Final  . Lymphocytes Relative 01/05/2018 38  % Final  . Lymphs Abs 01/05/2018 0.9  0.9 - 3.3 K/uL Final  . Monocytes Relative 01/05/2018 4  % Final  . Monocytes Absolute 01/05/2018 0.1  0.1 - 0.9 K/uL Final  . Eosinophils Relative 01/05/2018 1  % Final  . Eosinophils Absolute 01/05/2018 0.0  0.0 - 0.5 K/uL Final  . Basophils Relative 01/05/2018 1  % Final  . Basophils Absolute 01/05/2018 0.0  0.0 - 0.1 K/uL Final   Performed at [facility], [place] [address], [place], [place] [address]    (this displays the last labs from the last 3 days)  No results found for: TOTALPROTELP, ALBUMINELP, A1GS, A2GS, BETS, BETA2SER, GAMS, MSPIKE, SPEI (this displays SPEP labs)  No results found for: KPAFRELGTCHN, LAMBDASER, KAPLAMBRATIO (kappa/lambda light chains)  No results found for: HGBA, HGBA2QUANT, HGBFQUANT, HGBSQUAN (Hemoglobinopathy evaluation)   No results found for: LDH  No results found for: IRON, TIBC, IRONPCTSAT (Iron  and TIBC)  No results found for: FERRITIN  Urinalysis No results found for: COLORURINE, APPEARANCEUR, LABSPEC, PHURINE, GLUCOSEU, HGBUR, BILIRUBINUR, KETONESUR, PROTEINUR, UROBILINOGEN, NITRITE, LEUKOCYTESUR   STUDIES: No results found.  ELIGIBLE FOR AVAILABLE RESEARCH PROTOCOL: exact sciences study  ASSESSMENT: 77 y.o. [place], [place] woman status post right breast biopsy 07/01/2017 for a clinical mT2 N0, stage IB invasive ductal carcinoma, grade 1, estrogen and progesterone receptor positive, HER-2 nonamplified, with an MIB-105%  (1) a second area in the right breast  biopsied 07/01/2017 showed ductal carcinoma in situ, grade 3, estrogen and progesterone receptor positive.  (2) status post right mastectomy and sentinel lymph node sampling 09/24/2017 for a pT1b pN0, stage IA invasive ductal carcinoma, grade 1, with negative margins  (a) a total of 5 sentinel lymph nodes were removed  (3) The Oncotype DX score was 28, predicting a risk of outside the breast recurrence over the next 9 years of 17% if the patient's only systemic therapy is tamoxifen for 5 years.  It also predicts a significant benefit from chemotherapy.  (4) cyclophosphamide, methotrexate and fluorouracil (CMF) chemotherapy started 11/17/2017, repeated every 21 days x 8  (5) anastrozole started 08/04/2016, interrupted during chemotherapy  (6) genetics testing 07/22/2017 through the Common Hereditary Cancer Panel offered by [facility] found no deleterious mutations in APC, ATM, AXIN2, BARD1, BMPR1A, BRCA1, BRCA2, BRIP1, CDH1, CDKN2A (p14ARF), CDKN2A (p16INK4a), CKD4, CHEK2, CTNNA1, DICER1, EPCAM (Deletion/duplication testing only), GREM1 (promoter region deletion/duplication testing only), KIT, MEN1, MLH1, MSH2, MSH3, MSH6, MUTYH, NBN, NF1, NHTL1, PALB2, PDGFRA, PMS2, POLD1, POLE, PTEN, RAD50, RAD51C, RAD51D, SDHB, SDHC, SDHD, SMAD4, SMARCA4. STK11, TP53, TSC1, TSC2, and VHL.  The following genes were evaluated for sequence changes only: SDHA and HOXB13 c.251G>A variant only.   (a) a variant of uncertain significance in the gene DICER1 was identified.  c.2116+6G>A (Intronic)   PLAN: Norell is doing well Holt. Her labs are stable and she continues to tolerate her adjuvant chemotherapy well.  She has no sign of recurrence.  I reviewed that she can take colace for her mild constipation, one tablet per day to see if that will slightly help her mild constipation.    Erisha and I did spend time Holt revieweing the purpose of the Oncotype on predicting recurrence risk.  We reviewed that she may have  microscopic disease, or disease she cannot see as 2 cells or 8 cells, and that this chemotherapy's purpose is to clean anything microscopic up so that it will not cause problems down the road.  I also reviewed with her that if her breast cancer does recur outside the breast, it is then called breast cancer metastatic to the liver for example.  Not two different cancer.  She understands this.    Jamacia and I reviewed her treatment plan.  She will return in 2 weeks for labs and her CMF chemotherapy, and in 3 weeks for labs and f/u with [place].  She knows to call for any other issues that may develop before the next visit  A total of (30) minutes of face-to-face time was spent with this patient with greater than 50% of that time in counseling and care-coordination.   Wilber Bihari, NP  01/05/18 10:47 AM Medical Oncology and Hematology [facility] [address] [place], [place] [address] Tel. [phone]    Fax. [phone]

## 2018-01-05 NOTE — Telephone Encounter (Signed)
Per 9/25 los, no orders.

## 2018-01-19 ENCOUNTER — Inpatient Hospital Stay: Payer: Medicare HMO | Attending: Oncology

## 2018-01-19 ENCOUNTER — Other Ambulatory Visit: Payer: Self-pay | Admitting: Oncology

## 2018-01-19 ENCOUNTER — Inpatient Hospital Stay: Payer: Medicare HMO

## 2018-01-19 ENCOUNTER — Telehealth: Payer: Self-pay | Admitting: Oncology

## 2018-01-19 VITALS — BP 118/92 | HR 72 | Temp 98.1°F | Resp 18 | Ht 63.0 in | Wt 177.5 lb

## 2018-01-19 DIAGNOSIS — C50311 Malignant neoplasm of lower-inner quadrant of right female breast: Secondary | ICD-10-CM | POA: Insufficient documentation

## 2018-01-19 DIAGNOSIS — Z79899 Other long term (current) drug therapy: Secondary | ICD-10-CM | POA: Insufficient documentation

## 2018-01-19 DIAGNOSIS — Z5111 Encounter for antineoplastic chemotherapy: Secondary | ICD-10-CM | POA: Diagnosis not present

## 2018-01-19 DIAGNOSIS — Z9011 Acquired absence of right breast and nipple: Secondary | ICD-10-CM | POA: Insufficient documentation

## 2018-01-19 DIAGNOSIS — Z170 Estrogen receptor positive status [ER+]: Secondary | ICD-10-CM | POA: Insufficient documentation

## 2018-01-19 DIAGNOSIS — F419 Anxiety disorder, unspecified: Secondary | ICD-10-CM | POA: Insufficient documentation

## 2018-01-19 DIAGNOSIS — M8580 Other specified disorders of bone density and structure, unspecified site: Secondary | ICD-10-CM | POA: Insufficient documentation

## 2018-01-19 DIAGNOSIS — Z95828 Presence of other vascular implants and grafts: Secondary | ICD-10-CM

## 2018-01-19 DIAGNOSIS — Z7982 Long term (current) use of aspirin: Secondary | ICD-10-CM | POA: Diagnosis not present

## 2018-01-19 DIAGNOSIS — Z87891 Personal history of nicotine dependence: Secondary | ICD-10-CM | POA: Insufficient documentation

## 2018-01-19 LAB — CBC WITH DIFFERENTIAL/PLATELET
Abs Immature Granulocytes: 0.02 10*3/uL (ref 0.00–0.07)
BASOS ABS: 0 10*3/uL (ref 0.0–0.1)
Basophils Relative: 0 %
Eosinophils Absolute: 0 10*3/uL (ref 0.0–0.5)
Eosinophils Relative: 2 %
HCT: 37.5 % (ref 36.0–46.0)
HEMOGLOBIN: 12.2 g/dL (ref 12.0–15.0)
IMMATURE GRANULOCYTES: 1 %
LYMPHS ABS: 1.1 10*3/uL (ref 0.7–4.0)
LYMPHS PCT: 40 %
MCH: 31.9 pg (ref 26.0–34.0)
MCHC: 32.5 g/dL (ref 30.0–36.0)
MCV: 97.9 fL (ref 80.0–100.0)
Monocytes Absolute: 0.4 10*3/uL (ref 0.1–1.0)
Monocytes Relative: 14 %
NEUTROS PCT: 43 %
NRBC: 0 % (ref 0.0–0.2)
Neutro Abs: 1.1 10*3/uL — ABNORMAL LOW (ref 1.7–7.7)
Platelets: 226 10*3/uL (ref 150–400)
RBC: 3.83 MIL/uL — ABNORMAL LOW (ref 3.87–5.11)
RDW: 14 % (ref 11.5–15.5)
WBC: 2.6 10*3/uL — ABNORMAL LOW (ref 4.0–10.5)

## 2018-01-19 LAB — COMPREHENSIVE METABOLIC PANEL
ALBUMIN: 3.7 g/dL (ref 3.5–5.0)
ALT: 14 U/L (ref 0–44)
AST: 20 U/L (ref 15–41)
Alkaline Phosphatase: 84 U/L (ref 38–126)
Anion gap: 11 (ref 5–15)
BUN: 13 mg/dL (ref 8–23)
CHLORIDE: 102 mmol/L (ref 98–111)
CO2: 26 mmol/L (ref 22–32)
Calcium: 9.3 mg/dL (ref 8.9–10.3)
Creatinine, Ser: 0.73 mg/dL (ref 0.44–1.00)
GFR calc Af Amer: >60 mL/min (ref >60)
GFR calc non Af Amer: >60 mL/min (ref >60)
Glucose, Bld: 111 mg/dL — ABNORMAL HIGH (ref 70–99)
Potassium: 4.1 mmol/L (ref 3.5–5.1)
SODIUM: 139 mmol/L (ref 135–145)
Total Bilirubin: 0.3 mg/dL (ref 0.3–1.2)
Total Protein: 6.7 g/dL (ref 6.5–8.1)

## 2018-01-19 MED ORDER — SODIUM CHLORIDE 0.9% FLUSH
10.0000 mL | INTRAVENOUS | Status: DC | PRN
  Administered 2018-01-19: 10 mL
  Filled 2018-01-19: qty 10

## 2018-01-19 MED ORDER — PALONOSETRON HCL INJECTION 0.25 MG/5ML
0.2500 mg | Freq: Once | INTRAVENOUS | Status: AC
  Administered 2018-01-19: 0.25 mg via INTRAVENOUS

## 2018-01-19 MED ORDER — PALONOSETRON HCL INJECTION 0.25 MG/5ML
INTRAVENOUS | Status: AC
  Filled 2018-01-19: qty 5

## 2018-01-19 MED ORDER — DEXAMETHASONE SODIUM PHOSPHATE 10 MG/ML IJ SOLN
10.0000 mg | Freq: Once | INTRAMUSCULAR | Status: AC
  Administered 2018-01-19: 10 mg via INTRAVENOUS

## 2018-01-19 MED ORDER — SODIUM CHLORIDE 0.9 % IV SOLN
600.0000 mg/m2 | Freq: Once | INTRAVENOUS | Status: AC
  Administered 2018-01-19: 1140 mg via INTRAVENOUS
  Filled 2018-01-19: qty 57

## 2018-01-19 MED ORDER — HEPARIN SOD (PORK) LOCK FLUSH 100 UNIT/ML IV SOLN
500.0000 [IU] | Freq: Once | INTRAVENOUS | Status: AC | PRN
  Administered 2018-01-19: 500 [IU]
  Filled 2018-01-19: qty 5

## 2018-01-19 MED ORDER — METHOTREXATE SODIUM (PF) CHEMO INJECTION 250 MG/10ML
39.7000 mg/m2 | Freq: Once | INTRAMUSCULAR | Status: AC
  Administered 2018-01-19: 75 mg via INTRAVENOUS
  Filled 2018-01-19: qty 3

## 2018-01-19 MED ORDER — SODIUM CHLORIDE 0.9 % IV SOLN
Freq: Once | INTRAVENOUS | Status: AC
  Administered 2018-01-19: 13:00:00 via INTRAVENOUS
  Filled 2018-01-19: qty 250

## 2018-01-19 MED ORDER — DEXAMETHASONE SODIUM PHOSPHATE 10 MG/ML IJ SOLN
INTRAMUSCULAR | Status: AC
  Filled 2018-01-19: qty 1

## 2018-01-19 MED ORDER — FLUOROURACIL CHEMO INJECTION 2.5 GM/50ML
600.0000 mg/m2 | Freq: Once | INTRAVENOUS | Status: AC
  Administered 2018-01-19: 1150 mg via INTRAVENOUS
  Filled 2018-01-19: qty 23

## 2018-01-19 NOTE — Telephone Encounter (Signed)
Appts scheduled avs/calendar printed per 10/9 los °

## 2018-01-19 NOTE — Progress Notes (Signed)
Per Dr. Jana Hakim, okay to treat with [place] of 1.1.

## 2018-01-19 NOTE — Patient Instructions (Signed)
[facility] Discharge Instructions for Patients Receiving Chemotherapy  Today you received the following chemotherapy agents Cyclophosphamide (Cytoxan), Methotrexate & Fluorouracil (Adrucil).  To help prevent nausea and vomiting after your treatment, we encourage you to take your nausea medication as prescribed.  If you develop nausea and vomiting that is not controlled by your nausea medication, call the clinic.   BELOW ARE SYMPTOMS THAT SHOULD BE REPORTED IMMEDIATELY:  *FEVER GREATER THAN 100.5 F  *CHILLS WITH OR WITHOUT FEVER  NAUSEA AND VOMITING THAT IS NOT CONTROLLED WITH YOUR NAUSEA MEDICATION  *UNUSUAL SHORTNESS OF BREATH  *UNUSUAL BRUISING OR BLEEDING  TENDERNESS IN MOUTH AND THROAT WITH OR WITHOUT PRESENCE OF ULCERS  *URINARY PROBLEMS  *BOWEL PROBLEMS  UNUSUAL RASH Items with * indicate a potential emergency and should be followed up as soon as possible.  Feel free to call the clinic should you have any questions or concerns. The clinic phone number is [phone].  Please show the CHEMO ALERT CARD at check-in to the Emergency Department and triage nurse.   

## 2018-01-21 ENCOUNTER — Inpatient Hospital Stay: Payer: Medicare HMO

## 2018-01-21 DIAGNOSIS — M8580 Other specified disorders of bone density and structure, unspecified site: Secondary | ICD-10-CM | POA: Diagnosis not present

## 2018-01-21 DIAGNOSIS — Z79899 Other long term (current) drug therapy: Secondary | ICD-10-CM | POA: Diagnosis not present

## 2018-01-21 DIAGNOSIS — Z9011 Acquired absence of right breast and nipple: Secondary | ICD-10-CM | POA: Diagnosis not present

## 2018-01-21 DIAGNOSIS — C50311 Malignant neoplasm of lower-inner quadrant of right female breast: Secondary | ICD-10-CM | POA: Diagnosis not present

## 2018-01-21 DIAGNOSIS — Z95828 Presence of other vascular implants and grafts: Secondary | ICD-10-CM

## 2018-01-21 DIAGNOSIS — F419 Anxiety disorder, unspecified: Secondary | ICD-10-CM | POA: Diagnosis not present

## 2018-01-21 DIAGNOSIS — Z5111 Encounter for antineoplastic chemotherapy: Secondary | ICD-10-CM | POA: Diagnosis not present

## 2018-01-21 DIAGNOSIS — Z7982 Long term (current) use of aspirin: Secondary | ICD-10-CM | POA: Diagnosis not present

## 2018-01-21 DIAGNOSIS — Z170 Estrogen receptor positive status [ER+]: Secondary | ICD-10-CM | POA: Diagnosis not present

## 2018-01-21 DIAGNOSIS — Z87891 Personal history of nicotine dependence: Secondary | ICD-10-CM | POA: Diagnosis not present

## 2018-01-21 MED ORDER — PEGFILGRASTIM-CBQV 6 MG/0.6ML ~~LOC~~ SOSY
PREFILLED_SYRINGE | SUBCUTANEOUS | Status: AC
  Filled 2018-01-21: qty 0.6

## 2018-01-21 MED ORDER — PEGFILGRASTIM-CBQV 6 MG/0.6ML ~~LOC~~ SOSY
6.0000 mg | PREFILLED_SYRINGE | Freq: Once | SUBCUTANEOUS | Status: AC
  Administered 2018-01-21: 6 mg via SUBCUTANEOUS

## 2018-01-21 NOTE — Patient Instructions (Signed)
Pegfilgrastim injection What is this medicine? PEGFILGRASTIM (PEG fil gra stim) is a long-acting granulocyte colony-stimulating factor that stimulates the growth of neutrophils, a type of white blood cell important in the body's fight against infection. It is used to reduce the incidence of fever and infection in patients with certain types of cancer who are receiving chemotherapy that affects the bone marrow, and to increase survival after being exposed to high doses of radiation. This medicine may be used for other purposes; ask your health care provider or pharmacist if you have questions. COMMON BRAND NAME(S): Neulasta What should I tell my health care provider before I take this medicine? They need to know if you have any of these conditions: -kidney disease -latex allergy -ongoing radiation therapy -sickle cell disease -skin reactions to acrylic adhesives (On-Body Injector only) -an unusual or allergic reaction to pegfilgrastim, filgrastim, other medicines, foods, dyes, or preservatives -pregnant or trying to get pregnant -breast-feeding How should I use this medicine? This medicine is for injection under the skin. If you get this medicine at home, you will be taught how to prepare and give the pre-filled syringe or how to use the On-body Injector. Refer to the patient Instructions for Use for detailed instructions. Use exactly as directed. Tell your healthcare provider immediately if you suspect that the On-body Injector may not have performed as intended or if you suspect the use of the On-body Injector resulted in a missed or partial dose. It is important that you put your used needles and syringes in a special sharps container. Do not put them in a trash can. If you do not have a sharps container, call your pharmacist or healthcare provider to get one. Talk to your pediatrician regarding the use of this medicine in children. While this drug may be prescribed for selected conditions,  precautions do apply. Overdosage: If you think you have taken too much of this medicine contact a poison control center or emergency room at once. NOTE: This medicine is only for you. Do not share this medicine with others. What if I miss a dose? It is important not to miss your dose. Call your doctor or health care professional if you miss your dose. If you miss a dose due to an On-body Injector failure or leakage, a new dose should be administered as soon as possible using a single prefilled syringe for manual use. What may interact with this medicine? Interactions have not been studied. Give your health care provider a list of all the medicines, herbs, non-prescription drugs, or dietary supplements you use. Also tell them if you smoke, drink alcohol, or use illegal drugs. Some items may interact with your medicine. This list may not describe all possible interactions. Give your health care provider a list of all the medicines, herbs, non-prescription drugs, or dietary supplements you use. Also tell them if you smoke, drink alcohol, or use illegal drugs. Some items may interact with your medicine. What should I watch for while using this medicine? You may need blood work done while you are taking this medicine. If you are going to need a MRI, CT scan, or other procedure, tell your doctor that you are using this medicine (On-Body Injector only). What side effects may I notice from receiving this medicine? Side effects that you should report to your doctor or health care professional as soon as possible: -allergic reactions like skin rash, itching or hives, swelling of the face, lips, or tongue -dizziness -fever -pain, redness, or irritation at site   where injected -pinpoint red spots on the skin -red or dark-brown urine -shortness of breath or breathing problems -stomach or side pain, or pain at the shoulder -swelling -tiredness -trouble passing urine or change in the amount of urine Side  effects that usually do not require medical attention (report to your doctor or health care professional if they continue or are bothersome): -bone pain -muscle pain This list may not describe all possible side effects. Call your doctor for medical advice about side effects. You may report side effects to FDA at 1-800-FDA-1088. Where should I keep my medicine? Keep out of the reach of children. Store pre-filled syringes in a refrigerator between 2 and 8 degrees C (36 and 46 degrees F). Do not freeze. Keep in carton to protect from light. Throw away this medicine if it is left out of the refrigerator for more than 48 hours. Throw away any unused medicine after the expiration date. NOTE: This sheet is a summary. It may not cover all possible information. If you have questions about this medicine, talk to your doctor, pharmacist, or health care provider.  2018 Elsevier/Gold Standard (2016-03-26 12:58:03)  

## 2018-01-26 ENCOUNTER — Telehealth: Payer: Self-pay | Admitting: Adult Health

## 2018-01-26 ENCOUNTER — Inpatient Hospital Stay: Payer: Medicare HMO

## 2018-01-26 ENCOUNTER — Encounter: Payer: Self-pay | Admitting: Adult Health

## 2018-01-26 ENCOUNTER — Inpatient Hospital Stay: Payer: Medicare HMO | Admitting: Adult Health

## 2018-01-26 VITALS — BP 144/72 | HR 76 | Temp 98.3°F | Resp 18 | Ht 63.0 in | Wt 177.7 lb

## 2018-01-26 DIAGNOSIS — Z170 Estrogen receptor positive status [ER+]: Secondary | ICD-10-CM | POA: Diagnosis not present

## 2018-01-26 DIAGNOSIS — F419 Anxiety disorder, unspecified: Secondary | ICD-10-CM

## 2018-01-26 DIAGNOSIS — Z79899 Other long term (current) drug therapy: Secondary | ICD-10-CM | POA: Diagnosis not present

## 2018-01-26 DIAGNOSIS — Z95828 Presence of other vascular implants and grafts: Secondary | ICD-10-CM

## 2018-01-26 DIAGNOSIS — Z5111 Encounter for antineoplastic chemotherapy: Secondary | ICD-10-CM | POA: Diagnosis not present

## 2018-01-26 DIAGNOSIS — R1032 Left lower quadrant pain: Secondary | ICD-10-CM

## 2018-01-26 DIAGNOSIS — C50311 Malignant neoplasm of lower-inner quadrant of right female breast: Secondary | ICD-10-CM | POA: Diagnosis not present

## 2018-01-26 DIAGNOSIS — Z87891 Personal history of nicotine dependence: Secondary | ICD-10-CM

## 2018-01-26 DIAGNOSIS — Z9011 Acquired absence of right breast and nipple: Secondary | ICD-10-CM | POA: Diagnosis not present

## 2018-01-26 DIAGNOSIS — Z7982 Long term (current) use of aspirin: Secondary | ICD-10-CM | POA: Diagnosis not present

## 2018-01-26 DIAGNOSIS — M8580 Other specified disorders of bone density and structure, unspecified site: Secondary | ICD-10-CM | POA: Diagnosis not present

## 2018-01-26 LAB — CBC WITH DIFFERENTIAL/PLATELET
ABS IMMATURE GRANULOCYTES: 0.04 10*3/uL (ref 0.00–0.07)
BASOS PCT: 1 %
Basophils Absolute: 0 10*3/uL (ref 0.0–0.1)
EOS ABS: 0 10*3/uL (ref 0.0–0.5)
Eosinophils Relative: 1 %
HCT: 35.5 % — ABNORMAL LOW (ref 36.0–46.0)
Hemoglobin: 11.5 g/dL — ABNORMAL LOW (ref 12.0–15.0)
Immature Granulocytes: 1 %
Lymphocytes Relative: 29 %
Lymphs Abs: 1 10*3/uL (ref 0.7–4.0)
MCH: 31.7 pg (ref 26.0–34.0)
MCHC: 32.4 g/dL (ref 30.0–36.0)
MCV: 97.8 fL (ref 80.0–100.0)
Monocytes Absolute: 0.6 10*3/uL (ref 0.1–1.0)
Monocytes Relative: 17 %
NEUTROS PCT: 51 %
NRBC: 0 % (ref 0.0–0.2)
Neutro Abs: 1.7 10*3/uL (ref 1.7–7.7)
PLATELETS: 101 10*3/uL — AB (ref 150–400)
RBC: 3.63 MIL/uL — AB (ref 3.87–5.11)
RDW: 13.8 % (ref 11.5–15.5)
WBC: 3.4 10*3/uL — AB (ref 4.0–10.5)

## 2018-01-26 LAB — COMPREHENSIVE METABOLIC PANEL
ALK PHOS: 108 U/L (ref 38–126)
ALT: 13 U/L (ref 0–44)
ANION GAP: 9 (ref 5–15)
AST: 14 U/L — ABNORMAL LOW (ref 15–41)
Albumin: 3.8 g/dL (ref 3.5–5.0)
BILIRUBIN TOTAL: 0.5 mg/dL (ref 0.3–1.2)
BUN: 10 mg/dL (ref 8–23)
CALCIUM: 9.2 mg/dL (ref 8.9–10.3)
CO2: 28 mmol/L (ref 22–32)
Chloride: 104 mmol/L (ref 98–111)
Creatinine, Ser: 0.65 mg/dL (ref 0.44–1.00)
GFR calc non Af Amer: >60 mL/min (ref >60)
Glucose, Bld: 100 mg/dL — ABNORMAL HIGH (ref 70–99)
Potassium: 4.2 mmol/L (ref 3.5–5.1)
Sodium: 141 mmol/L (ref 135–145)
TOTAL PROTEIN: 6.6 g/dL (ref 6.5–8.1)

## 2018-01-26 MED ORDER — HEPARIN SOD (PORK) LOCK FLUSH 100 UNIT/ML IV SOLN
500.0000 [IU] | Freq: Once | INTRAVENOUS | Status: AC | PRN
  Administered 2018-01-26: 500 [IU]
  Filled 2018-01-26: qty 5

## 2018-01-26 MED ORDER — SODIUM CHLORIDE 0.9% FLUSH
10.0000 mL | INTRAVENOUS | Status: DC | PRN
  Administered 2018-01-26: 10 mL
  Filled 2018-01-26: qty 10

## 2018-01-26 NOTE — Patient Instructions (Signed)
Implanted Port Home Guide An implanted port is a type of central line that is placed under the skin. Central lines are used to provide IV access when treatment or nutrition needs to be given through a person's veins. Implanted ports are used for long-term IV access. An implanted port may be placed because:  You need IV medicine that would be irritating to the small veins in your hands or arms.  You need long-term IV medicines, such as antibiotics.  You need IV nutrition for a long period.  You need frequent blood draws for lab tests.  You need dialysis.  Implanted ports are usually placed in the chest area, but they can also be placed in the upper arm, the abdomen, or the leg. An implanted port has two main parts:  Reservoir. The reservoir is round and will appear as a small, raised area under your skin. The reservoir is the part where a needle is inserted to give medicines or draw blood.  Catheter. The catheter is a thin, flexible tube that extends from the reservoir. The catheter is placed into a large vein. Medicine that is inserted into the reservoir goes into the catheter and then into the vein.  How will I care for my incision site? Do not get the incision site wet. Bathe or shower as directed by your health care provider. How is my port accessed? Special steps must be taken to access the port:  Before the port is accessed, a numbing cream can be placed on the skin. This helps numb the skin over the port site.  Your health care provider uses a sterile technique to access the port. ? Your health care provider must put on a mask and sterile gloves. ? The skin over your port is cleaned carefully with an antiseptic and allowed to dry. ? The port is gently pinched between sterile gloves, and a needle is inserted into the port.  Only "non-coring" port needles should be used to access the port. Once the port is accessed, a blood return should be checked. This helps ensure that the port  is in the vein and is not clogged.  If your port needs to remain accessed for a constant infusion, a clear (transparent) bandage will be placed over the needle site. The bandage and needle will need to be changed every week, or as directed by your health care provider.  Keep the bandage covering the needle clean and dry. Do not get it wet. Follow your health care provider's instructions on how to take a shower or bath while the port is accessed.  If your port does not need to stay accessed, no bandage is needed over the port.  What is flushing? Flushing helps keep the port from getting clogged. Follow your health care provider's instructions on how and when to flush the port. Ports are usually flushed with saline solution or a medicine called heparin. The need for flushing will depend on how the port is used.  If the port is used for intermittent medicines or blood draws, the port will need to be flushed: ? After medicines have been given. ? After blood has been drawn. ? As part of routine maintenance.  If a constant infusion is running, the port may not need to be flushed.  How long will my port stay implanted? The port can stay in for as long as your health care provider thinks it is needed. When it is time for the port to come out, surgery will be   done to remove it. The procedure is similar to the one performed when the port was put in. When should I seek immediate medical care? When you have an implanted port, you should seek immediate medical care if:  You notice a bad smell coming from the incision site.  You have swelling, redness, or drainage at the incision site.  You have more swelling or pain at the port site or the surrounding area.  You have a fever that is not controlled with medicine.  This information is not intended to replace advice given to you by your health care provider. Make sure you discuss any questions you have with your health care provider. Document  Released: 03/30/2005 Document Revised: 09/05/2015 Document Reviewed: 12/05/2012 Elsevier Interactive Patient Education  2017 Elsevier Inc.  

## 2018-01-26 NOTE — Telephone Encounter (Signed)
Gave avs and calendar ° °

## 2018-01-26 NOTE — Progress Notes (Signed)
[place]  [phone]) [phone] [phone]) [phone]     ID: Lauren Holt DOB: 18-Jul-1940  MR#: [id number]  CHY#:[id number]  Patient Care Team: Alroy Dust, Carlean Jews.Marlou Sa, MD as PCP - General (Family Medicine) Jovita Kussmaul, MD as Consulting Physician (General Surgery) Magrinat, Virgie Dad, MD as Consulting Physician (Oncology) Kyung Rudd, MD as Consulting Physician (Radiation Oncology) OTHER MD:  CHIEF COMPLAINT: Estrogen receptor positive breast cancer  CURRENT TREATMENT: Adjuvant chemotherapy   HISTORY OF CURRENT ILLNESS: On the original intake note:  Lauren Holt had routine screening mammography on 06/14/2017 showing a possible area of calcifications in the right breast. She underwent unilateral right diagnostic mammography with tomography at [facility] on 06/23/2017 showing: Suspicious calcifications within the inferior right breast, spanning nearly 10 cm, more superior component spanning 4 cm extent. The more superior component may have a small associated mass. Ultrasonography of the right axilla on 07/01/2017 showed normal right axillary lymph nodes.   Accordingly on 06/29/2017 she proceeded to biopsy of the right breast area in question. The pathology from this procedure showed ([id number]): In the right breast lower central, more lateral superior: invasive ductal carcinoma grade I. In the lower central, more medial inferior: Ductal carcinoma in situ, high grade, with calcifications. The  invasive tumor was significant for estrogen receptor, 100% positive and progesterone receptor, 10% positive, both with strong staining intensity. Proliferation marker Ki67 at 5%. HER2  not amplified. The ductal carcinoma in situ was significant for estrogen receptor, 100% positive and progesterone receptor, 80% positive, both with strong staining intensity.   The patient's subsequent history is as detailed below.  INTERVAL HISTORY: Lauren Holt returns today for follow up and  treatment of her estrogen receptor positive breast cancer. She receives adjuvant chemotherapy with CMF given every 21 days for 8 planned cycles. Today is day 8 cycle 4.  Lauren Holt continues to tolerate her treatment well. She notes that initially after chemotherapy she will experience some redness around her neck line, that lasts for a few days and then resolves.  It is not rash like and it does not itch.    She received Udenyca with her last cycle due to an [place] of 1.1 on the day of treatment.  She tolerated this injection well and noted no issues.     She does note a persistent LLQ pain that is intermittent. She first brought this to our attention this on cycle 3 day 8.  At that point it had been present for a few days. She has undergone ultrasound to evaluate that was negative.  She is up to date with colonoscopy.  She denies any nausea, vomiting, diarrhea, constipation, blood in her stool, black tarry stool.  She would like a scan to evaluate.    REVIEW OF SYSTEMS: Other than the LLQ pain noted above Lauren Holt is feeling well.  She notes she is going out of town to see her mom and she will be out of town on 11/7 when her f/u with Dr. Jana Hakim is scheduled.  She wants to know what could be done about this.    Lauren Holt denies fevers, chills, headaches, unusual vision changes, chest pain, palpitations, cough, or shortness of breath.  A detailed ROS was otherwise non contributory today.    PAST MEDICAL HISTORY: Past Medical History:  Diagnosis Date  . Anxiety   . Breast cancer, right breast ([facility] [facility])   . Complication of anesthesia    difficult to wake - many years ago with tubal 50 years ago  .  Dermatitis   . Family history of adverse reaction to anesthesia    "I think my sister was hard to wake up" (09/24/2017)  . Family history of breast cancer 07/12/2017  . Family history of prostate cancer 07/12/2017  . Genetic testing   . Palpitations    when she lays down, has not seen anyone for this, feels like a  flutter   HLD ( but not too high)   PAST SURGICAL HISTORY: Past Surgical History:  Procedure Laterality Date  . BREAST BIOPSY Right 06/2017  . BREAST RECONSTRUCTION WITH PLACEMENT OF TISSUE EXPANDER AND ALLODERM Right 09/24/2017  . BREAST RECONSTRUCTION WITH PLACEMENT OF TISSUE EXPANDER AND ALLODERM Right 09/24/2017   Procedure: BREAST RECONSTRUCTION WITH PLACEMENT OF TISSUE EXPANDER AND ALLODERM;  Surgeon: Irene Limbo, MD;  Location: [facility];  Service: Plastics;  Laterality: Right;  . CARPAL TUNNEL RELEASE Bilateral   . COLONOSCOPY    . MASTECTOMY COMPLETE / SIMPLE W/ SENTINEL NODE BIOPSY Right 09/24/2017  . MASTECTOMY W/ SENTINEL NODE BIOPSY Right 09/24/2017   Procedure: MASTECTOMY WITH SENTINEL LYMPH NODE BIOPSY;  Surgeon: Jovita Kussmaul, MD;  Location: [facility];  Service: General;  Laterality: Right;  . PORTACATH PLACEMENT Left 11/08/2017   Procedure: INSERTION PORT-A-CATH;  Surgeon: Jovita Kussmaul, MD;  Location: [facility];  Service: General;  Laterality: Left;  . TUBAL LIGATION    . VAGINAL HYSTERECTOMY     Partial Hysterectomy without BSO. Because they were too close to the bladder.    FAMILY HISTORY Family History  Problem Relation Age of Onset  . Breast cancer Paternal Aunt   . Breast cancer Cousin 52  . Prostate cancer Father 79  . Breast cancer Cousin 18  . Breast cancer Cousin 25  . Breast cancer Cousin 60  . Breast cancer Other    The patient's father died at age 37 due to emphysema. The patient's mother is alive at age 17 as of March 2019. The patient has 5 brothers and 3 sisters. There was a paternal great aunt with breast cancer. There was also a paternal cousin with breast cancer.   GYNECOLOGIC HISTORY:  No LMP recorded. Patient has had a hysterectomy. Menarche: 77 years old Age at first live birth: 77 years old The patient is GXP3. She is s/p partial hysterectomy. Both of her ovaries remain. She used oral contraceptives with no complications. She also used HRT for 2  years without complications.     SOCIAL HISTORY:  Lauren Holt is now retired. 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 She is divorced. Her oldest daughter, [name]", is a [employer]th Land in [place], [place]. The patient's son, [name], works in a Proofreader in [place]. The patient's son, [name], works in Quarry manager in [place].  The patient has 4 grandchildren. She currently does not belong to a church.      ADVANCED DIRECTIVES: Not in place.  At the 07/07/2017 visit the patient was given the appropriate documents to complete and notarized at her discretion.  HEALTH MAINTENANCE: Social History   Tobacco Use  . Smoking status: Former Smoker    Years: 1.00    Types: Cigarettes  . Smokeless tobacco: Never Used  . Tobacco comment: Smoked X 1 year at age 11 (less than ppd)  Substance Use Topics  . Alcohol use: Not Currently  . Drug use: Never     Colonoscopy: 02/2017 at [facility]  PAP:   Bone density:  2018/ osteopenia   No Known Allergies  Current Outpatient Medications  Medication Sig Dispense Refill  . ALPRAZolam (XANAX) 0.25 MG tablet Take 0.125 mg by mouth 2 (two) times daily as needed for anxiety.     Marland Kitchen aspirin 325 MG tablet Take 325 mg by mouth daily.    Marland Kitchen BETA CAROTENE PO Take 7,500 mcg by mouth daily.    . calcium carbonate (CALCIUM 600) 600 MG TABS tablet Take 600 mg by mouth daily.    . Cholecalciferol (VITAMIN D-3) 5000 units TABS Take 1 tablet by mouth daily.    Marland Kitchen lidocaine-prilocaine (EMLA) cream Apply 1 application topically as needed. 30 g 1  . Lutein 20 MG CAPS Take 20 mg by mouth daily.     . Magnesium Citrate 100 MG TABS Take 100 mg by mouth daily.     . Multiple Vitamins-Minerals (ICAPS AREDS FORMULA PO) Take 1 capsule by mouth 3 (three) times a week.     . Probiotic Product (PROBIOTIC DAILY PO) Take 1 capsule by mouth 3 (three) times a week.     . prochlorperazine (COMPAZINE) 10 MG tablet Take 1 tablet  (10 mg total) by mouth every 6 (six) hours as needed for nausea or vomiting. 30 tablet 0  . Red Yeast Rice 600 MG CAPS Take 600 mg by mouth daily.     Marland Kitchen VITAMIN E PO Take 180 mg by mouth daily.    Marland Kitchen zinc gluconate 50 MG tablet Take 50 mg by mouth daily as needed (cold prevention).     No current facility-administered medications for this visit.     OBJECTIVE:  Vitals:   01/26/18 0950  BP: (!) 144/72  Pulse: 76  Resp: 18  Temp: 98.3 F (36.8 C)  SpO2: 100%     Body mass index is 31.48 kg/m.   Wt Readings from Last 3 Encounters:  01/26/18 177 lb 11.2 oz (80.6 kg)  01/19/18 177 lb 8 oz (80.5 kg)  01/05/18 178 lb 11.2 oz (81.1 kg)  ECOG FS:1 - Symptomatic but completely ambulatory GENERAL: Patient is a well appearing female in no acute distress HEENT:  Sclerae anicteric.  Oropharynx clear and moist. No ulcerations or evidence of oropharyngeal candidiasis. Neck is supple.  NODES:  No cervical, supraclavicular, or axillary lymphadenopathy palpated.  BREAST EXAM:  Right breast s/p mastectomy and implant placement, healing well, no sign of recurrence, left breast is benign LUNGS:  Clear to auscultation bilaterally.  No wheezes or rhonchi. HEART:  Regular rate and rhythm. No murmur appreciated. ABDOMEN:  Soft, mildly tender in LLQ.  Positive, normoactive bowel sounds. No organomegaly palpated. MSK:  No focal spinal tenderness to palpation. Full range of motion bilaterally in the upper extremities. EXTREMITIES:  No peripheral edema.   SKIN:  Clear with no obvious rashes or skin changes. No nail dyscrasia. NEURO:  Nonfocal. Well oriented.  Appropriate affect.   LAB RESULTS:  CMP     Component Value Date/Time   NA 141 01/26/2018 0853   K 4.2 01/26/2018 0853   CL 104 01/26/2018 0853   CO2 28 01/26/2018 0853   GLUCOSE 100 (H) 01/26/2018 0853   BUN 10 01/26/2018 0853   CREATININE 0.65 01/26/2018 0853   CREATININE 0.71 12/15/2017 1049   CALCIUM 9.2 01/26/2018 0853   PROT 6.6  01/26/2018 0853   ALBUMIN 3.8 01/26/2018 0853   AST 14 (L) 01/26/2018 0853   AST 15 12/15/2017 1049   ALT 13 01/26/2018 0853   ALT 13 12/15/2017 1049  ALKPHOS 108 01/26/2018 0853   BILITOT 0.5 01/26/2018 0853   BILITOT 0.4 12/15/2017 1049   GFRNONAA >60 01/26/2018 0853   GFRNONAA >60 12/15/2017 1049   GFRAA >60 01/26/2018 0853   GFRAA >60 12/15/2017 1049    No results found for: TOTALPROTELP, ALBUMINELP, A1GS, A2GS, BETS, BETA2SER, GAMS, MSPIKE, SPEI  No results found for: KPAFRELGTCHN, LAMBDASER, KAPLAMBRATIO  Lab Results  Component Value Date   WBC 3.4 (L) 01/26/2018   NEUTROABS PENDING 01/26/2018   HGB 11.5 (L) 01/26/2018   HCT 35.5 (L) 01/26/2018   MCV 97.8 01/26/2018   PLT 101 (L) 01/26/2018    '@LASTCHEMISTRY'$ @  No results found for: LABCA2  No components found for: ZYSAYT016  No results for input(s): INR in the last 168 hours.  No results found for: LABCA2  No results found for: WFU932  No results found for: TFT732  No results found for: KGU542  No results found for: CA2729  No components found for: HGQUANT  No results found for: CEA1 / No results found for: CEA1   No results found for: AFPTUMOR  No results found for: CHROMOGRNA  No results found for: PSA1  Appointment on 01/26/2018  Component Date Value Ref Range Status  . Sodium 01/26/2018 141  135 - 145 mmol/L Final  . Potassium 01/26/2018 4.2  3.5 - 5.1 mmol/L Final  . Chloride 01/26/2018 104  98 - 111 mmol/L Final  . CO2 01/26/2018 28  22 - 32 mmol/L Final  . Glucose, Bld 01/26/2018 100* 70 - 99 mg/dL Final  . BUN 01/26/2018 10  8 - 23 mg/dL Final  . Creatinine, Ser 01/26/2018 0.65  0.44 - 1.00 mg/dL Final  . Calcium 01/26/2018 9.2  8.9 - 10.3 mg/dL Final  . Total Protein 01/26/2018 6.6  6.5 - 8.1 g/dL Final  . Albumin 01/26/2018 3.8  3.5 - 5.0 g/dL Final  . AST 01/26/2018 14* 15 - 41 U/L Final  . ALT 01/26/2018 13  0 - 44 U/L Final  . Alkaline Phosphatase 01/26/2018 108  38 - 126 U/L  Final  . Total Bilirubin 01/26/2018 0.5  0.3 - 1.2 mg/dL Final  . GFR calc non Af Amer 01/26/2018 >60  >60 mL/min Final  . GFR calc Af Amer 01/26/2018 >60  >60 mL/min Final   Comment: (NOTE) The eGFR has been calculated using the CKD EPI equation. This calculation has not been validated in all clinical situations. eGFR's persistently <60 mL/min signify possible Chronic Kidney Disease.   Georgiann Hahn gap 01/26/2018 9  5 - 15 Final   Performed at [facility], [place] [address] [place], [place] [address]  . WBC 01/26/2018 3.4* 4.0 - 10.5 K/uL Final  . RBC 01/26/2018 3.63* 3.87 - 5.11 MIL/uL Final  . Hemoglobin 01/26/2018 11.5* 12.0 - 15.0 g/dL Final  . HCT 01/26/2018 35.5* 36.0 - 46.0 % Final  . MCV 01/26/2018 97.8  80.0 - 100.0 fL Final  . MCH 01/26/2018 31.7  26.0 - 34.0 pg Final  . MCHC 01/26/2018 32.4  30.0 - 36.0 g/dL Final  . RDW 01/26/2018 13.8  11.5 - 15.5 % Final  . Platelets 01/26/2018 101* 150 - 400 K/uL Final  . nRBC 01/26/2018 0.0  0.0 - 0.2 % Final   Performed at [facility], [place] [address] [place], [place] [address]  . Neutrophils Relative % 01/26/2018 PENDING  % Incomplete  . Neutro Abs 01/26/2018 PENDING  1.7 - 7.7 K/uL Incomplete  . Band  Neutrophils 01/26/2018 PENDING  % Incomplete  . Lymphocytes Relative 01/26/2018 PENDING  % Incomplete  . Lymphs Abs 01/26/2018 PENDING  0.7 - 4.0 K/uL Incomplete  . Monocytes Relative 01/26/2018 PENDING  % Incomplete  . Monocytes Absolute 01/26/2018 PENDING  0.1 - 1.0 K/uL Incomplete  . Eosinophils Relative 01/26/2018 PENDING  % Incomplete  . Eosinophils Absolute 01/26/2018 PENDING  0.0 - 0.5 K/uL Incomplete  . Basophils Relative 01/26/2018 PENDING  % Incomplete  . Basophils Absolute 01/26/2018 PENDING  0.0 - 0.1 K/uL Incomplete  . WBC Morphology 01/26/2018 PENDING   Incomplete  . RBC Morphology 01/26/2018 PENDING   Incomplete  . Smear Review 01/26/2018 PENDING   Incomplete  . Other  01/26/2018 PENDING  % Incomplete  . nRBC 01/26/2018 PENDING  0 /100 WBC Incomplete  . Metamyelocytes Relative 01/26/2018 PENDING  % Incomplete  . Myelocytes 01/26/2018 PENDING  % Incomplete  . Promyelocytes Relative 01/26/2018 PENDING  % Incomplete  . Blasts 01/26/2018 PENDING  % Incomplete    (this displays the last labs from the last 3 days)  No results found for: TOTALPROTELP, ALBUMINELP, A1GS, A2GS, BETS, BETA2SER, GAMS, MSPIKE, SPEI (this displays SPEP labs)  No results found for: KPAFRELGTCHN, LAMBDASER, KAPLAMBRATIO (kappa/lambda light chains)  No results found for: HGBA, HGBA2QUANT, HGBFQUANT, HGBSQUAN (Hemoglobinopathy evaluation)   No results found for: LDH  No results found for: IRON, TIBC, IRONPCTSAT (Iron and TIBC)  No results found for: FERRITIN  Urinalysis No results found for: COLORURINE, APPEARANCEUR, LABSPEC, PHURINE, GLUCOSEU, HGBUR, BILIRUBINUR, KETONESUR, PROTEINUR, UROBILINOGEN, NITRITE, LEUKOCYTESUR   STUDIES: No results found.  ELIGIBLE FOR AVAILABLE RESEARCH PROTOCOL: exact sciences study  ASSESSMENT: 77 y.o. [place], [place] woman status post right breast biopsy 07/01/2017 for a clinical mT2 N0, stage IB invasive ductal carcinoma, grade 1, estrogen and progesterone receptor positive, HER-2 nonamplified, with an MIB-105%  (1) a second area in the right breast biopsied 07/01/2017 showed ductal carcinoma in situ, grade 3, estrogen and progesterone receptor positive.  (2) status post right mastectomy and sentinel lymph node sampling 09/24/2017 for a pT1b pN0, stage IA invasive ductal carcinoma, grade 1, with negative margins  (a) a total of 5 sentinel lymph nodes were removed  (3) The Oncotype DX score was 28, predicting a risk of outside the breast recurrence over the next 9 years of 17% if the patient's only systemic therapy is tamoxifen for 5 years.  It also predicts a significant benefit from chemotherapy.  (4) cyclophosphamide, methotrexate and  fluorouracil (CMF) chemotherapy started 11/17/2017, repeated every 21 days x 8  (a) Udenyca added with cycle 4 and subsequent cycles due to neutropenia  (5) anastrozole started 08/04/2016, interrupted during chemotherapy  (6) genetics testing 07/22/2017 through the Common Hereditary Cancer Panel offered by [facility] found no deleterious mutations in APC, ATM, AXIN2, BARD1, BMPR1A, BRCA1, BRCA2, BRIP1, CDH1, CDKN2A (p14ARF), CDKN2A (p16INK4a), CKD4, CHEK2, CTNNA1, DICER1, EPCAM (Deletion/duplication testing only), GREM1 (promoter region deletion/duplication testing only), KIT, MEN1, MLH1, MSH2, MSH3, MSH6, MUTYH, NBN, NF1, NHTL1, PALB2, PDGFRA, PMS2, POLD1, POLE, PTEN, RAD50, RAD51C, RAD51D, SDHB, SDHC, SDHD, SMAD4, SMARCA4. STK11, TP53, TSC1, TSC2, and VHL.  The following genes were evaluated for sequence changes only: SDHA and HOXB13 c.251G>A variant only.   (a) a variant of uncertain significance in the gene DICER1 was identified.  c.2116+6G>A (Intronic)   PLAN: Lauren Holt is dong moderately well today.  Her CBC is stable, however her ANC and full panel of labs have not resulted by the time of the completion of her appointment.  She tolerated her treatment well, along with the addition of Udenyca.  We have adjusted her appointments around to accommodate her trip to see her mom.  She will see [place] at the start of cycle 6 instead.   For her persistent abdominal pain I ordered a CT abdomen and pelvis to fully evaluate, particularly since she had an ultrasound that didn't show anything.  This should hopefully get done within the next week.    She will return in 2 weeks for labs and her CMF chemotherapy with Udenyca support.  She knows to call for any other issues that may develop before the next visit  A total of (30) minutes of face-to-face time was spent with this patient with greater than 50% of that time in counseling and care-coordination.   Wilber Bihari, NP  01/26/18 10:35 AM Medical Oncology and  Hematology [facility] [address] [place], [place] [address] Tel. [phone]    Fax. [phone]

## 2018-02-09 ENCOUNTER — Inpatient Hospital Stay: Payer: Medicare HMO

## 2018-02-09 VITALS — BP 133/71 | HR 82 | Temp 98.4°F | Resp 18

## 2018-02-09 DIAGNOSIS — Z95828 Presence of other vascular implants and grafts: Secondary | ICD-10-CM

## 2018-02-09 DIAGNOSIS — Z5111 Encounter for antineoplastic chemotherapy: Secondary | ICD-10-CM | POA: Diagnosis not present

## 2018-02-09 DIAGNOSIS — F419 Anxiety disorder, unspecified: Secondary | ICD-10-CM | POA: Diagnosis not present

## 2018-02-09 DIAGNOSIS — Z170 Estrogen receptor positive status [ER+]: Principal | ICD-10-CM

## 2018-02-09 DIAGNOSIS — C50311 Malignant neoplasm of lower-inner quadrant of right female breast: Secondary | ICD-10-CM

## 2018-02-09 DIAGNOSIS — Z87891 Personal history of nicotine dependence: Secondary | ICD-10-CM | POA: Diagnosis not present

## 2018-02-09 DIAGNOSIS — Z9011 Acquired absence of right breast and nipple: Secondary | ICD-10-CM | POA: Diagnosis not present

## 2018-02-09 DIAGNOSIS — M8580 Other specified disorders of bone density and structure, unspecified site: Secondary | ICD-10-CM | POA: Diagnosis not present

## 2018-02-09 DIAGNOSIS — Z7982 Long term (current) use of aspirin: Secondary | ICD-10-CM | POA: Diagnosis not present

## 2018-02-09 DIAGNOSIS — Z79899 Other long term (current) drug therapy: Secondary | ICD-10-CM | POA: Diagnosis not present

## 2018-02-09 LAB — CBC WITH DIFFERENTIAL/PLATELET
Abs Immature Granulocytes: 0.01 10*3/uL (ref 0.00–0.07)
Basophils Absolute: 0 10*3/uL (ref 0.0–0.1)
Basophils Relative: 1 %
EOS ABS: 0 10*3/uL (ref 0.0–0.5)
EOS PCT: 1 %
HCT: 35.6 % — ABNORMAL LOW (ref 36.0–46.0)
HEMOGLOBIN: 11.7 g/dL — AB (ref 12.0–15.0)
Immature Granulocytes: 0 %
LYMPHS ABS: 1 10*3/uL (ref 0.7–4.0)
Lymphocytes Relative: 28 %
MCH: 32.4 pg (ref 26.0–34.0)
MCHC: 32.9 g/dL (ref 30.0–36.0)
MCV: 98.6 fL (ref 80.0–100.0)
MONO ABS: 0.5 10*3/uL (ref 0.1–1.0)
MONOS PCT: 13 %
Neutro Abs: 2 10*3/uL (ref 1.7–7.7)
Neutrophils Relative %: 57 %
Platelets: 244 10*3/uL (ref 150–400)
RBC: 3.61 MIL/uL — ABNORMAL LOW (ref 3.87–5.11)
RDW: 15 % (ref 11.5–15.5)
WBC: 3.4 10*3/uL — ABNORMAL LOW (ref 4.0–10.5)
nRBC: 0 % (ref 0.0–0.2)

## 2018-02-09 LAB — COMPREHENSIVE METABOLIC PANEL
ALK PHOS: 87 U/L (ref 38–126)
ALT: 22 U/L (ref 0–44)
ANION GAP: 8 (ref 5–15)
AST: 22 U/L (ref 15–41)
Albumin: 3.7 g/dL (ref 3.5–5.0)
BUN: 11 mg/dL (ref 8–23)
CALCIUM: 9.1 mg/dL (ref 8.9–10.3)
CO2: 29 mmol/L (ref 22–32)
Chloride: 104 mmol/L (ref 98–111)
Creatinine, Ser: 0.72 mg/dL (ref 0.44–1.00)
GFR calc Af Amer: >60 mL/min (ref >60)
Glucose, Bld: 103 mg/dL — ABNORMAL HIGH (ref 70–99)
POTASSIUM: 4.3 mmol/L (ref 3.5–5.1)
Sodium: 141 mmol/L (ref 135–145)
TOTAL PROTEIN: 6.8 g/dL (ref 6.5–8.1)
Total Bilirubin: 0.4 mg/dL (ref 0.3–1.2)

## 2018-02-09 MED ORDER — PALONOSETRON HCL INJECTION 0.25 MG/5ML
INTRAVENOUS | Status: AC
  Filled 2018-02-09: qty 5

## 2018-02-09 MED ORDER — HEPARIN SOD (PORK) LOCK FLUSH 100 UNIT/ML IV SOLN
500.0000 [IU] | Freq: Once | INTRAVENOUS | Status: AC | PRN
  Administered 2018-02-09: 500 [IU]
  Filled 2018-02-09: qty 5

## 2018-02-09 MED ORDER — DEXAMETHASONE SODIUM PHOSPHATE 10 MG/ML IJ SOLN
10.0000 mg | Freq: Once | INTRAMUSCULAR | Status: AC
  Administered 2018-02-09: 10 mg via INTRAVENOUS

## 2018-02-09 MED ORDER — SODIUM CHLORIDE 0.9% FLUSH
10.0000 mL | INTRAVENOUS | Status: DC | PRN
  Administered 2018-02-09: 10 mL
  Filled 2018-02-09: qty 10

## 2018-02-09 MED ORDER — FLUOROURACIL CHEMO INJECTION 2.5 GM/50ML
600.0000 mg/m2 | Freq: Once | INTRAVENOUS | Status: AC
  Administered 2018-02-09: 1150 mg via INTRAVENOUS
  Filled 2018-02-09: qty 23

## 2018-02-09 MED ORDER — DEXAMETHASONE SODIUM PHOSPHATE 10 MG/ML IJ SOLN
INTRAMUSCULAR | Status: AC
  Filled 2018-02-09: qty 1

## 2018-02-09 MED ORDER — PALONOSETRON HCL INJECTION 0.25 MG/5ML
0.2500 mg | Freq: Once | INTRAVENOUS | Status: AC
  Administered 2018-02-09: 0.25 mg via INTRAVENOUS

## 2018-02-09 MED ORDER — METHOTREXATE SODIUM (PF) CHEMO INJECTION 250 MG/10ML
39.7000 mg/m2 | Freq: Once | INTRAMUSCULAR | Status: AC
  Administered 2018-02-09: 75 mg via INTRAVENOUS
  Filled 2018-02-09: qty 3

## 2018-02-09 MED ORDER — SODIUM CHLORIDE 0.9 % IV SOLN
Freq: Once | INTRAVENOUS | Status: AC
  Administered 2018-02-09: 13:00:00 via INTRAVENOUS
  Filled 2018-02-09: qty 250

## 2018-02-09 MED ORDER — SODIUM CHLORIDE 0.9 % IV SOLN
600.0000 mg/m2 | Freq: Once | INTRAVENOUS | Status: AC
  Administered 2018-02-09: 1140 mg via INTRAVENOUS
  Filled 2018-02-09: qty 57

## 2018-02-09 NOTE — Patient Instructions (Signed)
[facility] Discharge Instructions for Patients Receiving Chemotherapy  Today you received the following chemotherapy agents Cyclophosphamide (Cytoxan), Methotrexate & Fluorouracil (Adrucil).  To help prevent nausea and vomiting after your treatment, we encourage you to take your nausea medication as prescribed.  If you develop nausea and vomiting that is not controlled by your nausea medication, call the clinic.   BELOW ARE SYMPTOMS THAT SHOULD BE REPORTED IMMEDIATELY:  *FEVER GREATER THAN 100.5 F  *CHILLS WITH OR WITHOUT FEVER  NAUSEA AND VOMITING THAT IS NOT CONTROLLED WITH YOUR NAUSEA MEDICATION  *UNUSUAL SHORTNESS OF BREATH  *UNUSUAL BRUISING OR BLEEDING  TENDERNESS IN MOUTH AND THROAT WITH OR WITHOUT PRESENCE OF ULCERS  *URINARY PROBLEMS  *BOWEL PROBLEMS  UNUSUAL RASH Items with * indicate a potential emergency and should be followed up as soon as possible.  Feel free to call the clinic should you have any questions or concerns. The clinic phone number is [phone].  Please show the CHEMO ALERT CARD at check-in to the Emergency Department and triage nurse.   

## 2018-02-11 ENCOUNTER — Inpatient Hospital Stay: Payer: Medicare HMO | Attending: Oncology

## 2018-02-11 ENCOUNTER — Encounter (HOSPITAL_COMMUNITY): Payer: Self-pay

## 2018-02-11 ENCOUNTER — Ambulatory Visit (HOSPITAL_COMMUNITY)
Admission: RE | Admit: 2018-02-11 | Discharge: 2018-02-11 | Disposition: A | Discharge status: Home / Self Care | Payer: Medicare HMO | Source: Ambulatory Visit | Attending: Adult Health | Admitting: Adult Health

## 2018-02-11 VITALS — BP 121/56 | HR 83 | Temp 98.6°F | Resp 18

## 2018-02-11 DIAGNOSIS — M8580 Other specified disorders of bone density and structure, unspecified site: Secondary | ICD-10-CM | POA: Insufficient documentation

## 2018-02-11 DIAGNOSIS — R1032 Left lower quadrant pain: Secondary | ICD-10-CM | POA: Insufficient documentation

## 2018-02-11 DIAGNOSIS — Z87891 Personal history of nicotine dependence: Secondary | ICD-10-CM | POA: Insufficient documentation

## 2018-02-11 DIAGNOSIS — R918 Other nonspecific abnormal finding of lung field: Secondary | ICD-10-CM | POA: Diagnosis not present

## 2018-02-11 DIAGNOSIS — Z79899 Other long term (current) drug therapy: Secondary | ICD-10-CM | POA: Diagnosis not present

## 2018-02-11 DIAGNOSIS — Z5189 Encounter for other specified aftercare: Secondary | ICD-10-CM | POA: Insufficient documentation

## 2018-02-11 DIAGNOSIS — Z170 Estrogen receptor positive status [ER+]: Secondary | ICD-10-CM | POA: Diagnosis not present

## 2018-02-11 DIAGNOSIS — K769 Liver disease, unspecified: Secondary | ICD-10-CM | POA: Diagnosis not present

## 2018-02-11 DIAGNOSIS — C50311 Malignant neoplasm of lower-inner quadrant of right female breast: Secondary | ICD-10-CM | POA: Insufficient documentation

## 2018-02-11 DIAGNOSIS — K429 Umbilical hernia without obstruction or gangrene: Secondary | ICD-10-CM | POA: Diagnosis not present

## 2018-02-11 DIAGNOSIS — Z5111 Encounter for antineoplastic chemotherapy: Secondary | ICD-10-CM | POA: Diagnosis not present

## 2018-02-11 DIAGNOSIS — Z95828 Presence of other vascular implants and grafts: Secondary | ICD-10-CM

## 2018-02-11 DIAGNOSIS — Z7982 Long term (current) use of aspirin: Secondary | ICD-10-CM | POA: Diagnosis not present

## 2018-02-11 DIAGNOSIS — F419 Anxiety disorder, unspecified: Secondary | ICD-10-CM | POA: Insufficient documentation

## 2018-02-11 DIAGNOSIS — Z79811 Long term (current) use of aromatase inhibitors: Secondary | ICD-10-CM | POA: Insufficient documentation

## 2018-02-11 MED ORDER — IOHEXOL 300 MG/ML  SOLN
100.0000 mL | Freq: Once | INTRAMUSCULAR | Status: AC | PRN
  Administered 2018-02-11: 100 mL via INTRAVENOUS

## 2018-02-11 MED ORDER — PEGFILGRASTIM-CBQV 6 MG/0.6ML ~~LOC~~ SOSY
6.0000 mg | PREFILLED_SYRINGE | Freq: Once | SUBCUTANEOUS | Status: AC
  Administered 2018-02-11: 6 mg via SUBCUTANEOUS

## 2018-02-11 MED ORDER — PEGFILGRASTIM-CBQV 6 MG/0.6ML ~~LOC~~ SOSY
PREFILLED_SYRINGE | SUBCUTANEOUS | Status: AC
  Filled 2018-02-11: qty 0.6

## 2018-02-11 MED ORDER — SODIUM CHLORIDE 0.9 % IJ SOLN
INTRAMUSCULAR | Status: AC
  Filled 2018-02-11: qty 50

## 2018-02-11 NOTE — Patient Instructions (Signed)
Pegfilgrastim injection What is this medicine? PEGFILGRASTIM (PEG fil gra stim) is a long-acting granulocyte colony-stimulating factor that stimulates the growth of neutrophils, a type of white blood cell important in the body's fight against infection. It is used to reduce the incidence of fever and infection in patients with certain types of cancer who are receiving chemotherapy that affects the bone marrow, and to increase survival after being exposed to high doses of radiation. This medicine may be used for other purposes; ask your health care provider or pharmacist if you have questions. COMMON BRAND NAME(S): Neulasta,Udenyca  What should I tell my health care provider before I take this medicine? They need to know if you have any of these conditions: -kidney disease -latex allergy -ongoing radiation therapy -sickle cell disease -skin reactions to acrylic adhesives (On-Body Injector only) -an unusual or allergic reaction to pegfilgrastim, filgrastim, other medicines, foods, dyes, or preservatives -pregnant or trying to get pregnant -breast-feeding How should I use this medicine? This medicine is for injection under the skin. If you get this medicine at home, you will be taught how to prepare and give the pre-filled syringe or how to use the On-body Injector. Refer to the patient Instructions for Use for detailed instructions. Use exactly as directed. Tell your healthcare provider immediately if you suspect that the On-body Injector may not have performed as intended or if you suspect the use of the On-body Injector resulted in a missed or partial dose. It is important that you put your used needles and syringes in a special sharps container. Do not put them in a trash can. If you do not have a sharps container, call your pharmacist or healthcare provider to get one. Talk to your pediatrician regarding the use of this medicine in children. While this drug may be prescribed for selected  conditions, precautions do apply. Overdosage: If you think you have taken too much of this medicine contact a poison control center or emergency room at once. NOTE: This medicine is only for you. Do not share this medicine with others. What if I miss a dose? It is important not to miss your dose. Call your doctor or health care professional if you miss your dose. If you miss a dose due to an On-body Injector failure or leakage, a new dose should be administered as soon as possible using a single prefilled syringe for manual use. What may interact with this medicine? Interactions have not been studied. Give your health care provider a list of all the medicines, herbs, non-prescription drugs, or dietary supplements you use. Also tell them if you smoke, drink alcohol, or use illegal drugs. Some items may interact with your medicine. This list may not describe all possible interactions. Give your health care provider a list of all the medicines, herbs, non-prescription drugs, or dietary supplements you use. Also tell them if you smoke, drink alcohol, or use illegal drugs. Some items may interact with your medicine. What should I watch for while using this medicine? You may need blood work done while you are taking this medicine. If you are going to need a MRI, CT scan, or other procedure, tell your doctor that you are using this medicine (On-Body Injector only). What side effects may I notice from receiving this medicine? Side effects that you should report to your doctor or health care professional as soon as possible: -allergic reactions like skin rash, itching or hives, swelling of the face, lips, or tongue -dizziness -fever -pain, redness, or irritation at   site where injected -pinpoint red spots on the skin -red or dark-brown urine -shortness of breath or breathing problems -stomach or side pain, or pain at the shoulder -swelling -tiredness -trouble passing urine or change in the amount of  urine Side effects that usually do not require medical attention (report to your doctor or health care professional if they continue or are bothersome): -bone pain -muscle pain This list may not describe all possible side effects. Call your doctor for medical advice about side effects. You may report side effects to FDA at 1-800-FDA-1088. Where should I keep my medicine? Keep out of the reach of children. Store pre-filled syringes in a refrigerator between 2 and 8 degrees C (36 and 46 degrees F). Do not freeze. Keep in carton to protect from light. Throw away this medicine if it is left out of the refrigerator for more than 48 hours. Throw away any unused medicine after the expiration date. NOTE: This sheet is a summary. It may not cover all possible information. If you have questions about this medicine, talk to your doctor, pharmacist, or health care provider.  2018 [employer]vier/Gold Standard (2016-03-26 12:58:03)  

## 2018-02-14 ENCOUNTER — Telehealth: Payer: Self-pay

## 2018-02-14 ENCOUNTER — Other Ambulatory Visit: Payer: Self-pay | Admitting: Adult Health

## 2018-02-14 DIAGNOSIS — C50311 Malignant neoplasm of lower-inner quadrant of right female breast: Secondary | ICD-10-CM

## 2018-02-14 DIAGNOSIS — Z170 Estrogen receptor positive status [ER+]: Principal | ICD-10-CM

## 2018-02-14 NOTE — Telephone Encounter (Signed)
-----   Message from Gardenia Phlegm, NP sent at 02/14/2018  1:10 PM EST ----- Please call in cipro 500mg  po bid disp 14 and flagyl 500mg  po q8h disp 21, no refills to [facility] in [place].  [phone].  Please call in under Dr. Virgie Dad name and license number since this is an out of state RX.  Also, call patient and let her know we sent this in and NOT to drink alcohol while taking flagyl as it will cause her to vomit.    Thanks,  lindsey

## 2018-02-14 NOTE — Progress Notes (Signed)
Reviewed CT scans with patient, including need ot order MR liver for liver lesions.  She is currently in [place] state with her sister.  Demoni has continued LLQ pain with constipation alternating with diarrhea.  I reviewed Dr. Virgie Dad suggestion of prescribing antibiotics for empiric treatment of diverticulitis.  Patient is in agreement and has no known allergies.  Will send antibiotics to [facility] at [phone].  Wilber Bihari, NP

## 2018-02-14 NOTE — Telephone Encounter (Signed)
LVM for patient that Cipro and Flagyl has been called in to [facility] in [place] state [phone] per patient request and she can pick them up there.  Also informed patient not to drink alcohol with Flagyl because it will cause her to vomit.  Left cancer center number on vm in case patient has any questions.

## 2018-02-17 ENCOUNTER — Other Ambulatory Visit: Payer: Medicare HMO

## 2018-02-17 ENCOUNTER — Ambulatory Visit: Payer: Medicare HMO | Admitting: Oncology

## 2018-03-02 ENCOUNTER — Inpatient Hospital Stay: Payer: Medicare HMO

## 2018-03-02 ENCOUNTER — Telehealth: Payer: Self-pay | Admitting: Adult Health

## 2018-03-02 ENCOUNTER — Other Ambulatory Visit: Payer: Medicare HMO

## 2018-03-02 ENCOUNTER — Inpatient Hospital Stay: Payer: Medicare HMO | Admitting: Adult Health

## 2018-03-02 ENCOUNTER — Encounter: Payer: Self-pay | Admitting: Adult Health

## 2018-03-02 VITALS — BP 135/53 | HR 88 | Temp 97.7°F | Resp 18 | Ht 63.0 in | Wt 178.9 lb

## 2018-03-02 DIAGNOSIS — C50311 Malignant neoplasm of lower-inner quadrant of right female breast: Secondary | ICD-10-CM

## 2018-03-02 DIAGNOSIS — Z170 Estrogen receptor positive status [ER+]: Secondary | ICD-10-CM | POA: Diagnosis not present

## 2018-03-02 DIAGNOSIS — Z5189 Encounter for other specified aftercare: Secondary | ICD-10-CM | POA: Diagnosis not present

## 2018-03-02 DIAGNOSIS — Z5111 Encounter for antineoplastic chemotherapy: Secondary | ICD-10-CM | POA: Diagnosis not present

## 2018-03-02 DIAGNOSIS — Z79811 Long term (current) use of aromatase inhibitors: Secondary | ICD-10-CM

## 2018-03-02 DIAGNOSIS — F419 Anxiety disorder, unspecified: Secondary | ICD-10-CM | POA: Diagnosis not present

## 2018-03-02 DIAGNOSIS — Z95828 Presence of other vascular implants and grafts: Secondary | ICD-10-CM

## 2018-03-02 DIAGNOSIS — Z87891 Personal history of nicotine dependence: Secondary | ICD-10-CM

## 2018-03-02 DIAGNOSIS — Z79899 Other long term (current) drug therapy: Secondary | ICD-10-CM | POA: Diagnosis not present

## 2018-03-02 DIAGNOSIS — M8580 Other specified disorders of bone density and structure, unspecified site: Secondary | ICD-10-CM | POA: Diagnosis not present

## 2018-03-02 LAB — CBC WITH DIFFERENTIAL/PLATELET
ABS IMMATURE GRANULOCYTES: 0.01 10*3/uL (ref 0.00–0.07)
BASOS ABS: 0 10*3/uL (ref 0.0–0.1)
Basophils Relative: 1 %
Eosinophils Absolute: 0 10*3/uL (ref 0.0–0.5)
Eosinophils Relative: 1 %
HCT: 35.9 % — ABNORMAL LOW (ref 36.0–46.0)
HEMOGLOBIN: 11.6 g/dL — AB (ref 12.0–15.0)
Immature Granulocytes: 0 %
LYMPHS PCT: 31 %
Lymphs Abs: 1 10*3/uL (ref 0.7–4.0)
MCH: 33 pg (ref 26.0–34.0)
MCHC: 32.3 g/dL (ref 30.0–36.0)
MCV: 102 fL — ABNORMAL HIGH (ref 80.0–100.0)
Monocytes Absolute: 0.4 10*3/uL (ref 0.1–1.0)
Monocytes Relative: 14 %
NEUTROS ABS: 1.8 10*3/uL (ref 1.7–7.7)
NRBC: 0 % (ref 0.0–0.2)
Neutrophils Relative %: 53 %
Platelets: 256 10*3/uL (ref 150–400)
RBC: 3.52 MIL/uL — AB (ref 3.87–5.11)
RDW: 15.5 % (ref 11.5–15.5)
WBC: 3.3 10*3/uL — AB (ref 4.0–10.5)

## 2018-03-02 LAB — COMPREHENSIVE METABOLIC PANEL
ALT: 30 U/L (ref 0–44)
AST: 36 U/L (ref 15–41)
Albumin: 3.7 g/dL (ref 3.5–5.0)
Alkaline Phosphatase: 92 U/L (ref 38–126)
Anion gap: 10 (ref 5–15)
BUN: 13 mg/dL (ref 8–23)
CHLORIDE: 103 mmol/L (ref 98–111)
CO2: 27 mmol/L (ref 22–32)
Calcium: 9.3 mg/dL (ref 8.9–10.3)
Creatinine, Ser: 0.77 mg/dL (ref 0.44–1.00)
GFR calc Af Amer: >60 mL/min (ref >60)
GFR calc non Af Amer: >60 mL/min (ref >60)
Glucose, Bld: 132 mg/dL — ABNORMAL HIGH (ref 70–99)
POTASSIUM: 4 mmol/L (ref 3.5–5.1)
SODIUM: 140 mmol/L (ref 135–145)
Total Bilirubin: 0.4 mg/dL (ref 0.3–1.2)
Total Protein: 6.9 g/dL (ref 6.5–8.1)

## 2018-03-02 MED ORDER — FLUOROURACIL CHEMO INJECTION 2.5 GM/50ML
600.0000 mg/m2 | Freq: Once | INTRAVENOUS | Status: AC
  Administered 2018-03-02: 1150 mg via INTRAVENOUS
  Filled 2018-03-02: qty 23

## 2018-03-02 MED ORDER — SODIUM CHLORIDE 0.9 % IV SOLN
600.0000 mg/m2 | Freq: Once | INTRAVENOUS | Status: AC
  Administered 2018-03-02: 1140 mg via INTRAVENOUS
  Filled 2018-03-02: qty 57

## 2018-03-02 MED ORDER — SODIUM CHLORIDE 0.9 % IV SOLN
Freq: Once | INTRAVENOUS | Status: AC
  Administered 2018-03-02: 13:00:00 via INTRAVENOUS
  Filled 2018-03-02: qty 250

## 2018-03-02 MED ORDER — DEXAMETHASONE SODIUM PHOSPHATE 10 MG/ML IJ SOLN
INTRAMUSCULAR | Status: AC
  Filled 2018-03-02: qty 1

## 2018-03-02 MED ORDER — PALONOSETRON HCL INJECTION 0.25 MG/5ML
INTRAVENOUS | Status: AC
  Filled 2018-03-02: qty 5

## 2018-03-02 MED ORDER — SODIUM CHLORIDE 0.9% FLUSH
10.0000 mL | INTRAVENOUS | Status: DC | PRN
  Administered 2018-03-02: 10 mL
  Filled 2018-03-02: qty 10

## 2018-03-02 MED ORDER — HEPARIN SOD (PORK) LOCK FLUSH 100 UNIT/ML IV SOLN
500.0000 [IU] | Freq: Once | INTRAVENOUS | Status: AC | PRN
  Administered 2018-03-02: 500 [IU]
  Filled 2018-03-02: qty 5

## 2018-03-02 MED ORDER — METHOTREXATE SODIUM (PF) CHEMO INJECTION 250 MG/10ML
39.7000 mg/m2 | Freq: Once | INTRAMUSCULAR | Status: AC
  Administered 2018-03-02: 75 mg via INTRAVENOUS
  Filled 2018-03-02: qty 3

## 2018-03-02 MED ORDER — DEXAMETHASONE SODIUM PHOSPHATE 10 MG/ML IJ SOLN
10.0000 mg | Freq: Once | INTRAMUSCULAR | Status: AC
  Administered 2018-03-02: 10 mg via INTRAVENOUS

## 2018-03-02 MED ORDER — PALONOSETRON HCL INJECTION 0.25 MG/5ML
0.2500 mg | Freq: Once | INTRAVENOUS | Status: AC
  Administered 2018-03-02: 0.25 mg via INTRAVENOUS

## 2018-03-02 NOTE — Patient Instructions (Signed)
[facility] Discharge Instructions for Patients Receiving Chemotherapy  Today you received the following chemotherapy agents Cyclophosphamide (Cytoxan), Methotrexate & Fluorouracil (Adrucil).  To help prevent nausea and vomiting after your treatment, we encourage you to take your nausea medication as prescribed.  If you develop nausea and vomiting that is not controlled by your nausea medication, call the clinic.   BELOW ARE SYMPTOMS THAT SHOULD BE REPORTED IMMEDIATELY:  *FEVER GREATER THAN 100.5 F  *CHILLS WITH OR WITHOUT FEVER  NAUSEA AND VOMITING THAT IS NOT CONTROLLED WITH YOUR NAUSEA MEDICATION  *UNUSUAL SHORTNESS OF BREATH  *UNUSUAL BRUISING OR BLEEDING  TENDERNESS IN MOUTH AND THROAT WITH OR WITHOUT PRESENCE OF ULCERS  *URINARY PROBLEMS  *BOWEL PROBLEMS  UNUSUAL RASH Items with * indicate a potential emergency and should be followed up as soon as possible.  Feel free to call the clinic should you have any questions or concerns. The clinic phone number is [phone].  Please show the [facility] at check-in to the Emergency Department and triage nurse.

## 2018-03-02 NOTE — Progress Notes (Signed)
[place]  [phone] [phone]) [phone]     ID: [name] DOB: 07/17/1940  MR#: [id number]  EUM#:[id number]  Patient Care Team: Alroy Dust, Carlean Jews.Marlou Sa, MD as PCP - General (Family Medicine) Jovita Kussmaul, MD as Consulting Physician (General Surgery) Magrinat, Virgie Dad, MD as Consulting Physician (Oncology) Kyung Rudd, MD as Consulting Physician (Radiation Oncology) OTHER MD:  CHIEF COMPLAINT: Estrogen receptor positive breast cancer  CURRENT TREATMENT: Adjuvant chemotherapy   HISTORY OF CURRENT ILLNESS: On the original intake note:  Lauren Holt had routine screening mammography on 06/14/2017 showing a possible area of calcifications in the right breast. She underwent unilateral right diagnostic mammography with tomography at [facility] on 06/23/2017 showing: Suspicious calcifications within the inferior right breast, spanning nearly 10 cm, more superior component spanning 4 cm extent. The more superior component may have a small associated mass. Ultrasonography of the right axilla on 07/01/2017 showed normal right axillary lymph nodes.   Accordingly on 06/29/2017 she proceeded to biopsy of the right breast area in question. The pathology from this procedure showed ([id number]): In the right breast lower central, more lateral superior: invasive ductal carcinoma grade I. In the lower central, more medial inferior: Ductal carcinoma in situ, high grade, with calcifications. The  invasive tumor was significant for estrogen receptor, 100% positive and progesterone receptor, 10% positive, both with strong staining intensity. Proliferation marker Ki67 at 5%. HER2  not amplified. The ductal carcinoma in situ was significant for estrogen receptor, 100% positive and progesterone receptor, 80% positive, both with strong staining intensity.   The patient's subsequent history is as detailed below.  INTERVAL HISTORY: Lauren Holt returns today for follow up and  treatment of her estrogen receptor positive breast cancer. She receives adjuvant chemotherapy with CMF given every 21 days for 8 planned cycles. Today is day 1 cycle 6.  Lauren Holt continues to tolerate her treatment well.   REVIEW OF SYSTEMS: She is doing well today.  She recently went to the [employer] to visit family.  She enjoyed herself.  She was written for cipro/flagyl and it was sent out [place], however she didn't take it until she returned because of the contraindication of drinking ETOH with Flagyl.  The LLQ pain is improving, she has until Saturday to finish these.  She has not yet had liver MRI, but this is scheduled on Friday, 03/04/2018.  Otherwise, Lauren Holt is doing well and a detailed ROS was non contributory.      PAST MEDICAL HISTORY: Past Medical History:  Diagnosis Date  . Anxiety   . Breast cancer, right breast ([place])   . Complication of anesthesia    difficult to wake - many years ago with tubal 50 years ago  . Dermatitis   . Family history of adverse reaction to anesthesia    "I think my sister was hard to wake up" (09/24/2017)  . Family history of breast cancer 07/12/2017  . Family history of prostate cancer 07/12/2017  . Genetic testing   . Palpitations    when she lays down, has not seen anyone for this, feels like a flutter   HLD ( but not too high)   PAST SURGICAL HISTORY: Past Surgical History:  Procedure Laterality Date  . BREAST BIOPSY Right 06/2017  . BREAST RECONSTRUCTION WITH PLACEMENT OF TISSUE EXPANDER AND ALLODERM Right 09/24/2017  . BREAST RECONSTRUCTION WITH PLACEMENT OF TISSUE EXPANDER AND ALLODERM Right 09/24/2017   Procedure: BREAST RECONSTRUCTION WITH PLACEMENT OF TISSUE EXPANDER AND ALLODERM;  Surgeon: Irene Limbo, MD;  Location: [facility];  Service: Plastics;  Laterality: Right;  . CARPAL TUNNEL RELEASE Bilateral   . COLONOSCOPY    . MASTECTOMY COMPLETE / SIMPLE W/ SENTINEL NODE BIOPSY Right 09/24/2017  . MASTECTOMY W/ SENTINEL NODE BIOPSY Right  09/24/2017   Procedure: MASTECTOMY WITH SENTINEL LYMPH NODE BIOPSY;  Surgeon: Jovita Kussmaul, MD;  Location: [facility];  Service: General;  Laterality: Right;  . PORTACATH PLACEMENT Left 11/08/2017   Procedure: INSERTION PORT-A-CATH;  Surgeon: Jovita Kussmaul, MD;  Location: [facility];  Service: General;  Laterality: Left;  . TUBAL LIGATION    . VAGINAL HYSTERECTOMY     Partial Hysterectomy without BSO. Because they were too close to the bladder.    FAMILY HISTORY Family History  Problem Relation Age of Onset  . Breast cancer Paternal Aunt   . Breast cancer Cousin 65  . Prostate cancer Father 67  . Breast cancer Cousin 31  . Breast cancer Cousin 25  . Breast cancer Cousin 1  . Breast cancer Other    The patient's father died at age 11 due to emphysema. The patient's mother is alive at age 73 as of March 2019. The patient has 5 brothers and 3 sisters. There was a paternal great aunt with breast cancer. There was also a paternal cousin with breast cancer.   GYNECOLOGIC HISTORY:  No LMP recorded. Patient has had a hysterectomy. Menarche: 77 years old Age at first live birth: 77 years old The patient is GXP3. She is s/p partial hysterectomy. Both of her ovaries remain. She used oral contraceptives with no complications. She also used HRT for 2 years without complications.     SOCIAL HISTORY:  Lauren Holt is now retired. 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 She is divorced. Her oldest daughter, Lauren Holt", is a [employer]th Land in [place], [place]. The patient's son, Lauren Holt, works in a Proofreader in [place]. The patient's son, Lauren Holt, works in Quarry manager in [place].  The patient has 4 grandchildren. She currently does not belong to a church.      ADVANCED DIRECTIVES: Not in place.  At the 07/07/2017 visit the patient was given the appropriate documents to complete and notarized at her discretion.  HEALTH MAINTENANCE: Social History   Tobacco  Use  . Smoking status: Former Smoker    Years: 1.00    Types: Cigarettes  . Smokeless tobacco: Never Used  . Tobacco comment: Smoked X 1 year at age 82 (less than ppd)  Substance Use Topics  . Alcohol use: Not Currently  . Drug use: Never     Colonoscopy: 02/2017 at [facility]  PAP:   Bone density: 2018/ osteopenia   No Known Allergies  Current Outpatient Medications  Medication Sig Dispense Refill  . ALPRAZolam (XANAX) 0.25 MG tablet Take 0.125 mg by mouth 2 (two) times daily as needed for anxiety.     Marland Kitchen aspirin 325 MG tablet Take 325 mg by mouth daily.    Marland Kitchen BETA CAROTENE PO Take 7,500 mcg by mouth daily.    . calcium carbonate (CALCIUM 600) 600 MG TABS tablet Take 600 mg by mouth daily.    . Cholecalciferol (VITAMIN D-3) 5000 units TABS Take 1 tablet by mouth daily.    Marland Kitchen lidocaine-prilocaine (EMLA) cream Apply 1 application topically as needed. 30 g 1  . Lutein 20 MG CAPS Take 20 mg by mouth daily.     . Magnesium Citrate 100 MG  TABS Take 100 mg by mouth daily.     . Multiple Vitamins-Minerals (ICAPS AREDS FORMULA PO) Take 1 capsule by mouth 3 (three) times a week.     . Probiotic Product (PROBIOTIC DAILY PO) Take 1 capsule by mouth 3 (three) times a week.     . prochlorperazine (COMPAZINE) 10 MG tablet Take 1 tablet (10 mg total) by mouth every 6 (six) hours as needed for nausea or vomiting. 30 tablet 0  . Red Yeast Rice 600 MG CAPS Take 600 mg by mouth daily.     Marland Kitchen VITAMIN E PO Take 180 mg by mouth daily.    Marland Kitchen zinc gluconate 50 MG tablet Take 50 mg by mouth daily as needed (cold prevention).     No current facility-administered medications for this visit.     OBJECTIVE:  There were no vitals filed for this visit.   There is no height or weight on file to calculate BMI.   Wt Readings from Last 3 Encounters:  01/26/18 177 lb 11.2 oz (80.6 kg)  01/19/18 177 lb 8 oz (80.5 kg)  01/05/18 178 lb 11.2 oz (81.1 kg)  ECOG FS:1 - Symptomatic but completely  ambulatory GENERAL: Patient is a well appearing female in no acute distress HEENT:  Sclerae anicteric.  Oropharynx clear and moist. No ulcerations or evidence of oropharyngeal candidiasis. Neck is supple.  NODES:  No cervical, supraclavicular, or axillary lymphadenopathy palpated.  BREAST EXAM:  Right breast s/p mastectomy and implant placement, healing well, no sign of recurrence, left breast is benign LUNGS:  Clear to auscultation bilaterally.  No wheezes or rhonchi. HEART:  Regular rate and rhythm. No murmur appreciated. ABDOMEN:  Soft, mildly tender in LLQ.  Positive, normoactive bowel sounds. No organomegaly palpated. MSK:  No focal spinal tenderness to palpation. Full range of motion bilaterally in the upper extremities. EXTREMITIES:  No peripheral edema.   SKIN:  Clear with no obvious rashes or skin changes. No nail dyscrasia. NEURO:  Nonfocal. Well oriented.  Appropriate affect.   LAB RESULTS:  CMP     Component Value Date/Time   NA 141 02/09/2018 1113   K 4.3 02/09/2018 1113   CL 104 02/09/2018 1113   CO2 29 02/09/2018 1113   GLUCOSE 103 (H) 02/09/2018 1113   BUN 11 02/09/2018 1113   CREATININE 0.72 02/09/2018 1113   CREATININE 0.71 12/15/2017 1049   CALCIUM 9.1 02/09/2018 1113   PROT 6.8 02/09/2018 1113   ALBUMIN 3.7 02/09/2018 1113   AST 22 02/09/2018 1113   AST 15 12/15/2017 1049   ALT 22 02/09/2018 1113   ALT 13 12/15/2017 1049   ALKPHOS 87 02/09/2018 1113   BILITOT 0.4 02/09/2018 1113   BILITOT 0.4 12/15/2017 1049   GFRNONAA >60 02/09/2018 1113   GFRNONAA >60 12/15/2017 1049   GFRAA >60 02/09/2018 1113   GFRAA >60 12/15/2017 1049    No results found for: TOTALPROTELP, ALBUMINELP, A1GS, A2GS, BETS, BETA2SER, GAMS, MSPIKE, SPEI  No results found for: KPAFRELGTCHN, LAMBDASER, KAPLAMBRATIO  Lab Results  Component Value Date   WBC 3.4 (L) 02/09/2018   NEUTROABS 2.0 02/09/2018   HGB 11.7 (L) 02/09/2018   HCT 35.6 (L) 02/09/2018   MCV 98.6 02/09/2018   PLT  244 02/09/2018    '@LASTCHEMISTRY'$ @  No results found for: LABCA2  No components found for: VOHYWV371  No results for input(s): INR in the last 168 hours.  No results found for: LABCA2  No results found for: GGY694  No results found  for: XHB716  No results found for: RCV893  No results found for: CA2729  No components found for: HGQUANT  No results found for: CEA1 / No results found for: CEA1   No results found for: AFPTUMOR  No results found for: CHROMOGRNA  No results found for: PSA1  No visits with results within 3 Day(s) from this visit.  Latest known visit with results is:  Appointment on 02/09/2018  Component Date Value Ref Range Status  . WBC 02/09/2018 3.4* 4.0 - 10.5 K/uL Final  . RBC 02/09/2018 3.61* 3.87 - 5.11 MIL/uL Final  . Hemoglobin 02/09/2018 11.7* 12.0 - 15.0 g/dL Final  . HCT 02/09/2018 35.6* 36.0 - 46.0 % Final  . MCV 02/09/2018 98.6  80.0 - 100.0 fL Final  . MCH 02/09/2018 32.4  26.0 - 34.0 pg Final  . MCHC 02/09/2018 32.9  30.0 - 36.0 g/dL Final  . RDW 02/09/2018 15.0  11.5 - 15.5 % Final  . Platelets 02/09/2018 244  150 - 400 K/uL Final  . nRBC 02/09/2018 0.0  0.0 - 0.2 % Final  . Neutrophils Relative % 02/09/2018 57  % Final  . Neutro Abs 02/09/2018 2.0  1.7 - 7.7 K/uL Final  . Lymphocytes Relative 02/09/2018 28  % Final  . Lymphs Abs 02/09/2018 1.0  0.7 - 4.0 K/uL Final  . Monocytes Relative 02/09/2018 13  % Final  . Monocytes Absolute 02/09/2018 0.5  0.1 - 1.0 K/uL Final  . Eosinophils Relative 02/09/2018 1  % Final  . Eosinophils Absolute 02/09/2018 0.0  0.0 - 0.5 K/uL Final  . Basophils Relative 02/09/2018 1  % Final  . Basophils Absolute 02/09/2018 0.0  0.0 - 0.1 K/uL Final  . Immature Granulocytes 02/09/2018 0  % Final  . Abs Immature Granulocytes 02/09/2018 0.01  0.00 - 0.07 K/uL Final   Performed at [facility], [place] [address], [place], [place] [address]  . Sodium 02/09/2018 141  135 - 145 mmol/L Final   . Potassium 02/09/2018 4.3  3.5 - 5.1 mmol/L Final  . Chloride 02/09/2018 104  98 - 111 mmol/L Final  . CO2 02/09/2018 29  22 - 32 mmol/L Final  . Glucose, Bld 02/09/2018 103* 70 - 99 mg/dL Final  . BUN 02/09/2018 11  8 - 23 mg/dL Final  . Creatinine, Ser 02/09/2018 0.72  0.44 - 1.00 mg/dL Final  . Calcium 02/09/2018 9.1  8.9 - 10.3 mg/dL Final  . Total Protein 02/09/2018 6.8  6.5 - 8.1 g/dL Final  . Albumin 02/09/2018 3.7  3.5 - 5.0 g/dL Final  . AST 02/09/2018 22  15 - 41 U/L Final  . ALT 02/09/2018 22  0 - 44 U/L Final  . Alkaline Phosphatase 02/09/2018 87  38 - 126 U/L Final  . Total Bilirubin 02/09/2018 0.4  0.3 - 1.2 mg/dL Final  . GFR calc non Af Amer 02/09/2018 >60  >60 mL/min Final  . GFR calc Af Amer 02/09/2018 >60  >60 mL/min Final   Comment: (NOTE) The eGFR has been calculated using the CKD EPI equation. This calculation has not been validated in all clinical situations. eGFR's persistently <60 mL/min signify possible Chronic Kidney Disease.   Georgiann Hahn gap 02/09/2018 8  5 - 15 Final   Performed at [facility], [place] [address] [place],  [address]    (this displays the last labs from the last 3 days)  No results found for: TOTALPROTELP, ALBUMINELP, A1GS, A2GS, BETS, BETA2SER, GAMS,  MSPIKE, SPEI (this displays SPEP labs)  No results found for: KPAFRELGTCHN, LAMBDASER, KAPLAMBRATIO (kappa/lambda light chains)  No results found for: HGBA, HGBA2QUANT, HGBFQUANT, HGBSQUAN (Hemoglobinopathy evaluation)   No results found for: LDH  No results found for: IRON, TIBC, IRONPCTSAT (Iron and TIBC)  No results found for: FERRITIN  Urinalysis No results found for: COLORURINE, APPEARANCEUR, LABSPEC, PHURINE, GLUCOSEU, HGBUR, BILIRUBINUR, KETONESUR, PROTEINUR, UROBILINOGEN, NITRITE, LEUKOCYTESUR   STUDIES: Ct Abdomen Pelvis W Contrast  Result Date: 02/11/2018 CLINICAL DATA:  Invasive RIGHT breast cancer diagnosed in 2019, on chemotherapy,  progression suspected, restaging, LEFT lower quadrant pain, constipation and diarrhea; former smoker EXAM: CT ABDOMEN AND PELVIS WITH CONTRAST TECHNIQUE: Multidetector CT imaging of the abdomen and pelvis was performed using the standard protocol following bolus administration of intravenous contrast. Sagittal and coronal MPR images reconstructed from axial data set. CONTRAST:  133m OMNIPAQUE IOHEXOL 300 MG/ML SOLN IV. Dilute oral contrast. COMPARISON:  None FINDINGS: Lower chest: Minimal subsegmental atelectasis at lung bases. Small focus of infiltrate RIGHT lower lobe. RIGHT breast prosthesis. Hepatobiliary: Poorly defined mass lesion identified LEFT lobe liver centrally 14 x 11 mm image 17 question metastasis. Additional tiny questionable lesion 5 mm diameter at inferior tip of RIGHT lobe liver image 38. No definite additional hepatic masses identified. Gallbladder unremarkable. No biliary dilatation. Pancreas: Normal appearance Spleen: Normal appearance Adrenals/Urinary Tract: Adrenal glands normal appearance. Tiny BILATERAL renal cysts. Kidneys, ureters, and bladder otherwise normal appearance Stomach/Bowel: Normal appendix. Stomach decompressed. Bowel loops normal appearance. Vascular/Lymphatic: Atherosclerotic calcification aorta without aneurysm. Few phleboliths in pelvis. No adenopathy. Reproductive: Uterus surgically absent.  Normal sized ovaries. Other: No free air or free fluid. Tiny umbilical hernia containing fat. No acute inflammatory process. Musculoskeletal: Mild degenerative disc disease changes lumbar spine greatest at L5-S1. No definite osseous metastases identified. Intramuscular lipoma of the LEFT tensor fascia lata. IMPRESSION: Two low-attenuation foci within the liver, 14 x 11 mm at central LEFT lobe and 5 mm at inferior RIGHT lobe, cannot exclude hepatic metastases. Focus of minimal infiltrate in RIGHT lower lobe. Tiny umbilical hernia containing fat. No additional acute intra-abdominal or  intrapelvic abnormalities. Electronically Signed   By: MLavonia DanaM.D.   On: 02/11/2018 10:31    ELIGIBLE FOR AVAILABLE RESEARCH PROTOCOL: exact sciences study  ASSESSMENT: 77[place].o. [place] status post right breast biopsy 07/01/2017 for a clinical mT2 N0, stage IB invasive ductal carcinoma, grade 1, estrogen and progesterone receptor positive, HER-2 nonamplified, with an MIB-105%  (1) a second area in the right breast biopsied 07/01/2017 showed ductal carcinoma in situ, grade 3, estrogen and progesterone receptor positive.  (2) status post right mastectomy and sentinel lymph node sampling 09/24/2017 for a pT1b pN0, stage IA invasive ductal carcinoma, grade 1, with negative margins  (a) a total of 5 sentinel lymph nodes were removed  (3) The Oncotype DX score was 28, predicting a risk of outside the breast recurrence over the next 9 years of 17% if the patient's only systemic therapy is tamoxifen for 5 years.  It also predicts a significant benefit from chemotherapy.  (4) cyclophosphamide, methotrexate and fluorouracil (CMF) chemotherapy started 11/17/2017, repeated every 21 days x 8  (a) Udenyca added with cycle 4 and subsequent cycles due to neutropenia  (5) anastrozole started 08/04/2016, interrupted during chemotherapy  (6) genetics testing 07/22/2017 through the Common Hereditary Cancer Panel offered by [facility] found no deleterious mutations in APC, ATM, AXIN2, BARD1, BMPR1A, BRCA1, BRCA2, BRIP1, CDH1, CDKN2A (p14ARF), CDKN2A (p16INK4a), CKD4, CHEK2, CTNNA1, DICER1, EPCAM (  Deletion/duplication testing only), GREM1 (promoter region deletion/duplication testing only), KIT, MEN1, MLH1, MSH2, MSH3, MSH6, MUTYH, NBN, NF1, NHTL1, PALB2, PDGFRA, PMS2, POLD1, POLE, PTEN, RAD50, RAD51C, RAD51D, SDHB, SDHC, SDHD, SMAD4, SMARCA4. STK11, TP53, TSC1, TSC2, and VHL.  The following genes were evaluated for sequence changes only: SDHA and HOXB13 c.251G>A variant only.   (a) a variant of  uncertain significance in the gene DICER1 was identified.  c.2116+6G>A (Intronic)   PLAN: Reinette is dong moderately well today.  Her labs are stable and she will proceed with her sixth cycle of CMF today.  We reviewed her upcoming schedule and will readjust some things.  She will go back to seeing [place] the week after her chemotherapy.    She will undergo MRI liver on Friday.  She will also continue her antibiotics.  It is reassuring that she is clinically improving with taking them.    She will return in 3 weeks for labs and CMF, and in 4 weeks for labs and f/u.  She knows to call for any other issues that may develop before the next visit  A total of (20) minutes of face-to-face time was spent with this patient with greater than 50% of that time in counseling and care-coordination.   Wilber Bihari, NP  03/02/18 6:50 AM Medical Oncology and Hematology [facility] [address] [place],  [address] Tel. [phone]    Fax. [phone]

## 2018-03-02 NOTE — Telephone Encounter (Signed)
Gave avs and calendar ° °

## 2018-03-04 ENCOUNTER — Inpatient Hospital Stay: Payer: Medicare HMO

## 2018-03-04 ENCOUNTER — Encounter (HOSPITAL_COMMUNITY): Payer: Self-pay

## 2018-03-04 ENCOUNTER — Ambulatory Visit (HOSPITAL_COMMUNITY)
Admission: RE | Admit: 2018-03-04 | Discharge: 2018-03-04 | Disposition: A | Discharge status: Home / Self Care | Payer: Medicare HMO | Source: Ambulatory Visit | Attending: Adult Health | Admitting: Adult Health

## 2018-03-04 DIAGNOSIS — Z79899 Other long term (current) drug therapy: Secondary | ICD-10-CM | POA: Diagnosis not present

## 2018-03-04 DIAGNOSIS — Z87891 Personal history of nicotine dependence: Secondary | ICD-10-CM | POA: Diagnosis not present

## 2018-03-04 DIAGNOSIS — Z5189 Encounter for other specified aftercare: Secondary | ICD-10-CM | POA: Diagnosis not present

## 2018-03-04 DIAGNOSIS — C50311 Malignant neoplasm of lower-inner quadrant of right female breast: Secondary | ICD-10-CM

## 2018-03-04 DIAGNOSIS — Z5111 Encounter for antineoplastic chemotherapy: Secondary | ICD-10-CM | POA: Diagnosis not present

## 2018-03-04 DIAGNOSIS — Z170 Estrogen receptor positive status [ER+]: Secondary | ICD-10-CM | POA: Diagnosis not present

## 2018-03-04 DIAGNOSIS — M8580 Other specified disorders of bone density and structure, unspecified site: Secondary | ICD-10-CM | POA: Diagnosis not present

## 2018-03-04 DIAGNOSIS — F419 Anxiety disorder, unspecified: Secondary | ICD-10-CM | POA: Diagnosis not present

## 2018-03-04 DIAGNOSIS — Z79811 Long term (current) use of aromatase inhibitors: Secondary | ICD-10-CM | POA: Diagnosis not present

## 2018-03-04 DIAGNOSIS — Z95828 Presence of other vascular implants and grafts: Secondary | ICD-10-CM

## 2018-03-04 MED ORDER — PEGFILGRASTIM-CBQV 6 MG/0.6ML ~~LOC~~ SOSY
6.0000 mg | PREFILLED_SYRINGE | Freq: Once | SUBCUTANEOUS | Status: AC
  Administered 2018-03-04: 6 mg via SUBCUTANEOUS

## 2018-03-04 MED ORDER — PEGFILGRASTIM-CBQV 6 MG/0.6ML ~~LOC~~ SOSY
PREFILLED_SYRINGE | SUBCUTANEOUS | Status: AC
  Filled 2018-03-04: qty 0.6

## 2018-03-04 NOTE — Progress Notes (Signed)
Patient arrived for MRI Liver wo/w. Patient has unsafe Breast Tissue expanders still in place until March 2020. Patient cannot be scanned until expanders are removed. Patient received clear instructions on why she could not be scanned. I explained that the order will be placed back into Epic and we would contact the ordering MD/NP and they will call he with further instructions. 8:01am  bhj

## 2018-03-04 NOTE — Patient Instructions (Signed)
Pegfilgrastim injection What is this medicine? PEGFILGRASTIM (PEG fil gra stim) is a long-acting granulocyte colony-stimulating factor that stimulates the growth of neutrophils, a type of white blood cell important in the body's fight against infection. It is used to reduce the incidence of fever and infection in patients with certain types of cancer who are receiving chemotherapy that affects the bone marrow, and to increase survival after being exposed to high doses of radiation. This medicine may be used for other purposes; ask your health care provider or pharmacist if you have questions. COMMON BRAND NAME(S): Neulasta What should I tell my health care provider before I take this medicine? They need to know if you have any of these conditions: -kidney disease -latex allergy -ongoing radiation therapy -sickle cell disease -skin reactions to acrylic adhesives (On-Body Injector only) -an unusual or allergic reaction to pegfilgrastim, filgrastim, other medicines, foods, dyes, or preservatives -pregnant or trying to get pregnant -breast-feeding How should I use this medicine? This medicine is for injection under the skin. If you get this medicine at home, you will be taught how to prepare and give the pre-filled syringe or how to use the On-body Injector. Refer to the patient Instructions for Use for detailed instructions. Use exactly as directed. Tell your healthcare provider immediately if you suspect that the On-body Injector may not have performed as intended or if you suspect the use of the On-body Injector resulted in a missed or partial dose. It is important that you put your used needles and syringes in a special sharps container. Do not put them in a trash can. If you do not have a sharps container, call your pharmacist or healthcare provider to get one. Talk to your pediatrician regarding the use of this medicine in children. While this drug may be prescribed for selected conditions,  precautions do apply. Overdosage: If you think you have taken too much of this medicine contact a poison control center or emergency room at once. NOTE: This medicine is only for you. Do not share this medicine with others. What if I miss a dose? It is important not to miss your dose. Call your doctor or health care professional if you miss your dose. If you miss a dose due to an On-body Injector failure or leakage, a new dose should be administered as soon as possible using a single prefilled syringe for manual use. What may interact with this medicine? Interactions have not been studied. Give your health care provider a list of all the medicines, herbs, non-prescription drugs, or dietary supplements you use. Also tell them if you smoke, drink alcohol, or use illegal drugs. Some items may interact with your medicine. This list may not describe all possible interactions. Give your health care provider a list of all the medicines, herbs, non-prescription drugs, or dietary supplements you use. Also tell them if you smoke, drink alcohol, or use illegal drugs. Some items may interact with your medicine. What should I watch for while using this medicine? You may need blood work done while you are taking this medicine. If you are going to need a MRI, CT scan, or other procedure, tell your doctor that you are using this medicine (On-Body Injector only). What side effects may I notice from receiving this medicine? Side effects that you should report to your doctor or health care professional as soon as possible: -allergic reactions like skin rash, itching or hives, swelling of the face, lips, or tongue -dizziness -fever -pain, redness, or irritation at site   where injected -pinpoint red spots on the skin -red or dark-brown urine -shortness of breath or breathing problems -stomach or side pain, or pain at the shoulder -swelling -tiredness -trouble passing urine or change in the amount of urine Side  effects that usually do not require medical attention (report to your doctor or health care professional if they continue or are bothersome): -bone pain -muscle pain This list may not describe all possible side effects. Call your doctor for medical advice about side effects. You may report side effects to FDA at 1-800-FDA-1088. Where should I keep my medicine? Keep out of the reach of children. Store pre-filled syringes in a refrigerator between 2 and 8 degrees C (36 and 46 degrees F). Do not freeze. Keep in carton to protect from light. Throw away this medicine if it is left out of the refrigerator for more than 48 hours. Throw away any unused medicine after the expiration date. NOTE: This sheet is a summary. It may not cover all possible information. If you have questions about this medicine, talk to your doctor, pharmacist, or health care provider.  2018 Elsevier/Gold Standard (2016-03-26 12:58:03)  

## 2018-03-23 ENCOUNTER — Inpatient Hospital Stay: Payer: Medicare HMO | Attending: Oncology

## 2018-03-23 ENCOUNTER — Inpatient Hospital Stay: Payer: Medicare HMO

## 2018-03-23 ENCOUNTER — Other Ambulatory Visit: Payer: Self-pay | Admitting: Oncology

## 2018-03-23 VITALS — BP 127/68 | HR 84 | Temp 98.1°F | Resp 18

## 2018-03-23 DIAGNOSIS — C50311 Malignant neoplasm of lower-inner quadrant of right female breast: Secondary | ICD-10-CM

## 2018-03-23 DIAGNOSIS — Z170 Estrogen receptor positive status [ER+]: Secondary | ICD-10-CM | POA: Insufficient documentation

## 2018-03-23 DIAGNOSIS — Z23 Encounter for immunization: Secondary | ICD-10-CM | POA: Insufficient documentation

## 2018-03-23 DIAGNOSIS — M8580 Other specified disorders of bone density and structure, unspecified site: Secondary | ICD-10-CM | POA: Insufficient documentation

## 2018-03-23 DIAGNOSIS — Z7982 Long term (current) use of aspirin: Secondary | ICD-10-CM | POA: Diagnosis not present

## 2018-03-23 DIAGNOSIS — Z79899 Other long term (current) drug therapy: Secondary | ICD-10-CM | POA: Diagnosis not present

## 2018-03-23 DIAGNOSIS — Z5111 Encounter for antineoplastic chemotherapy: Secondary | ICD-10-CM | POA: Insufficient documentation

## 2018-03-23 DIAGNOSIS — Z5189 Encounter for other specified aftercare: Secondary | ICD-10-CM | POA: Diagnosis not present

## 2018-03-23 DIAGNOSIS — F419 Anxiety disorder, unspecified: Secondary | ICD-10-CM | POA: Insufficient documentation

## 2018-03-23 DIAGNOSIS — Z87891 Personal history of nicotine dependence: Secondary | ICD-10-CM | POA: Diagnosis not present

## 2018-03-23 DIAGNOSIS — Z95828 Presence of other vascular implants and grafts: Secondary | ICD-10-CM

## 2018-03-23 LAB — CBC WITH DIFFERENTIAL/PLATELET
ABS IMMATURE GRANULOCYTES: 0.01 10*3/uL (ref 0.00–0.07)
Basophils Absolute: 0 10*3/uL (ref 0.0–0.1)
Basophils Relative: 1 %
Eosinophils Absolute: 0 10*3/uL (ref 0.0–0.5)
Eosinophils Relative: 2 %
HCT: 37.4 % (ref 36.0–46.0)
Hemoglobin: 11.9 g/dL — ABNORMAL LOW (ref 12.0–15.0)
IMMATURE GRANULOCYTES: 0 %
Lymphocytes Relative: 33 %
Lymphs Abs: 0.8 10*3/uL (ref 0.7–4.0)
MCH: 33 pg (ref 26.0–34.0)
MCHC: 31.8 g/dL (ref 30.0–36.0)
MCV: 103.6 fL — ABNORMAL HIGH (ref 80.0–100.0)
Monocytes Absolute: 0.4 10*3/uL (ref 0.1–1.0)
Monocytes Relative: 14 %
NEUTROS PCT: 50 %
Neutro Abs: 1.3 10*3/uL — ABNORMAL LOW (ref 1.7–7.7)
PLATELETS: 213 10*3/uL (ref 150–400)
RBC: 3.61 MIL/uL — ABNORMAL LOW (ref 3.87–5.11)
RDW: 15.1 % (ref 11.5–15.5)
WBC: 2.5 10*3/uL — ABNORMAL LOW (ref 4.0–10.5)
nRBC: 0 % (ref 0.0–0.2)

## 2018-03-23 LAB — COMPREHENSIVE METABOLIC PANEL
ALK PHOS: 101 U/L (ref 38–126)
ALT: 20 U/L (ref 0–44)
AST: 26 U/L (ref 15–41)
Albumin: 3.9 g/dL (ref 3.5–5.0)
Anion gap: 12 (ref 5–15)
BILIRUBIN TOTAL: 0.4 mg/dL (ref 0.3–1.2)
BUN: 12 mg/dL (ref 8–23)
CO2: 25 mmol/L (ref 22–32)
Calcium: 9.2 mg/dL (ref 8.9–10.3)
Chloride: 104 mmol/L (ref 98–111)
Creatinine, Ser: 0.78 mg/dL (ref 0.44–1.00)
Glucose, Bld: 153 mg/dL — ABNORMAL HIGH (ref 70–99)
Potassium: 4.3 mmol/L (ref 3.5–5.1)
SODIUM: 141 mmol/L (ref 135–145)
Total Protein: 7.1 g/dL (ref 6.5–8.1)

## 2018-03-23 MED ORDER — SODIUM CHLORIDE 0.9 % IV SOLN
600.0000 mg/m2 | Freq: Once | INTRAVENOUS | Status: AC
  Administered 2018-03-23: 1140 mg via INTRAVENOUS
  Filled 2018-03-23: qty 57

## 2018-03-23 MED ORDER — PALONOSETRON HCL INJECTION 0.25 MG/5ML
0.2500 mg | Freq: Once | INTRAVENOUS | Status: AC
  Administered 2018-03-23: 0.25 mg via INTRAVENOUS

## 2018-03-23 MED ORDER — DEXAMETHASONE SODIUM PHOSPHATE 10 MG/ML IJ SOLN
INTRAMUSCULAR | Status: AC
  Filled 2018-03-23: qty 1

## 2018-03-23 MED ORDER — PALONOSETRON HCL INJECTION 0.25 MG/5ML
INTRAVENOUS | Status: AC
  Filled 2018-03-23: qty 5

## 2018-03-23 MED ORDER — FLUOROURACIL CHEMO INJECTION 2.5 GM/50ML
600.0000 mg/m2 | Freq: Once | INTRAVENOUS | Status: AC
  Administered 2018-03-23: 1150 mg via INTRAVENOUS
  Filled 2018-03-23: qty 23

## 2018-03-23 MED ORDER — SODIUM CHLORIDE 0.9% FLUSH
10.0000 mL | INTRAVENOUS | Status: DC | PRN
  Administered 2018-03-23: 10 mL
  Filled 2018-03-23: qty 10

## 2018-03-23 MED ORDER — HEPARIN SOD (PORK) LOCK FLUSH 100 UNIT/ML IV SOLN
500.0000 [IU] | Freq: Once | INTRAVENOUS | Status: DC | PRN
  Filled 2018-03-23: qty 5

## 2018-03-23 MED ORDER — SODIUM CHLORIDE 0.9 % IV SOLN
Freq: Once | INTRAVENOUS | Status: AC
  Administered 2018-03-23: 13:00:00 via INTRAVENOUS
  Filled 2018-03-23: qty 250

## 2018-03-23 MED ORDER — DEXAMETHASONE SODIUM PHOSPHATE 10 MG/ML IJ SOLN
10.0000 mg | Freq: Once | INTRAMUSCULAR | Status: AC
  Administered 2018-03-23: 10 mg via INTRAVENOUS

## 2018-03-23 MED ORDER — METHOTREXATE SODIUM (PF) CHEMO INJECTION 250 MG/10ML
39.7000 mg/m2 | Freq: Once | INTRAMUSCULAR | Status: AC
  Administered 2018-03-23: 75 mg via INTRAVENOUS
  Filled 2018-03-23: qty 3

## 2018-03-23 MED ORDER — HEPARIN SOD (PORK) LOCK FLUSH 100 UNIT/ML IV SOLN
500.0000 [IU] | Freq: Once | INTRAVENOUS | Status: AC | PRN
  Administered 2018-03-23: 500 [IU]
  Filled 2018-03-23: qty 5

## 2018-03-23 NOTE — Patient Instructions (Signed)
[facility] Discharge Instructions for Patients Receiving Chemotherapy  Today you received the following chemotherapy agents Cytoxan, Methotrexate, Adrucil  To help prevent nausea and vomiting after your treatment, we encourage you to take your nausea medication as directed   If you develop nausea and vomiting that is not controlled by your nausea medication, call the clinic.   BELOW ARE SYMPTOMS THAT SHOULD BE REPORTED IMMEDIATELY:  *FEVER GREATER THAN 100.5 F  *CHILLS WITH OR WITHOUT FEVER  NAUSEA AND VOMITING THAT IS NOT CONTROLLED WITH YOUR NAUSEA MEDICATION  *UNUSUAL SHORTNESS OF BREATH  *UNUSUAL BRUISING OR BLEEDING  TENDERNESS IN MOUTH AND THROAT WITH OR WITHOUT PRESENCE OF ULCERS  *URINARY PROBLEMS  *BOWEL PROBLEMS  UNUSUAL RASH Items with * indicate a potential emergency and should be followed up as soon as possible.  Feel free to call the clinic should you have any questions or concerns. The clinic phone number is [phone].  Please show the [place] at check-in to the Emergency Department and triage nurse.

## 2018-03-23 NOTE — Patient Instructions (Signed)
Implanted Port Home Guide An implanted port is a type of central line that is placed under the skin. Central lines are used to provide IV access when treatment or nutrition needs to be given through a person's veins. Implanted ports are used for long-term IV access. An implanted port may be placed because:  You need IV medicine that would be irritating to the small veins in your hands or arms.  You need long-term IV medicines, such as antibiotics.  You need IV nutrition for a long period.  You need frequent blood draws for lab tests.  You need dialysis.  Implanted ports are usually placed in the chest area, but they can also be placed in the upper arm, the abdomen, or the leg. An implanted port has two main parts:  Reservoir. The reservoir is round and will appear as a small, raised area under your skin. The reservoir is the part where a needle is inserted to give medicines or draw blood.  Catheter. The catheter is a thin, flexible tube that extends from the reservoir. The catheter is placed into a large vein. Medicine that is inserted into the reservoir goes into the catheter and then into the vein.  How will I care for my incision site? Do not get the incision site wet. Bathe or shower as directed by your health care provider. How is my port accessed? Special steps must be taken to access the port:  Before the port is accessed, a numbing cream can be placed on the skin. This helps numb the skin over the port site.  Your health care provider uses a sterile technique to access the port. ? Your health care provider must put on a mask and sterile gloves. ? The skin over your port is cleaned carefully with an antiseptic and allowed to dry. ? The port is gently pinched between sterile gloves, and a needle is inserted into the port.  Only "non-coring" port needles should be used to access the port. Once the port is accessed, a blood return should be checked. This helps ensure that the port  is in the vein and is not clogged.  If your port needs to remain accessed for a constant infusion, a clear (transparent) bandage will be placed over the needle site. The bandage and needle will need to be changed every week, or as directed by your health care provider.  Keep the bandage covering the needle clean and dry. Do not get it wet. Follow your health care provider's instructions on how to take a shower or bath while the port is accessed.  If your port does not need to stay accessed, no bandage is needed over the port.  What is flushing? Flushing helps keep the port from getting clogged. Follow your health care provider's instructions on how and when to flush the port. Ports are usually flushed with saline solution or a medicine called heparin. The need for flushing will depend on how the port is used.  If the port is used for intermittent medicines or blood draws, the port will need to be flushed: ? After medicines have been given. ? After blood has been drawn. ? As part of routine maintenance.  If a constant infusion is running, the port may not need to be flushed.  How long will my port stay implanted? The port can stay in for as long as your health care provider thinks it is needed. When it is time for the port to come out, surgery will be   done to remove it. The procedure is similar to the one performed when the port was put in. When should I seek immediate medical care? When you have an implanted port, you should seek immediate medical care if:  You notice a bad smell coming from the incision site.  You have swelling, redness, or drainage at the incision site.  You have more swelling or pain at the port site or the surrounding area.  You have a fever that is not controlled with medicine.  This information is not intended to replace advice given to you by your health care provider. Make sure you discuss any questions you have with your health care provider. Document  Released: 03/30/2005 Document Revised: 09/05/2015 Document Reviewed: 12/05/2012 Elsevier Interactive Patient Education  2017 Elsevier Inc.  

## 2018-03-23 NOTE — Progress Notes (Signed)
Day 1, Cycle 7, for CMF.  Okay to treat with ANC of 1.3 today per Dr. Jana Hakim.

## 2018-03-24 ENCOUNTER — Other Ambulatory Visit: Payer: Self-pay | Admitting: Oncology

## 2018-03-25 ENCOUNTER — Inpatient Hospital Stay: Payer: Medicare HMO

## 2018-03-25 VITALS — BP 130/93 | HR 75 | Temp 97.5°F | Resp 18

## 2018-03-25 DIAGNOSIS — M8580 Other specified disorders of bone density and structure, unspecified site: Secondary | ICD-10-CM | POA: Diagnosis not present

## 2018-03-25 DIAGNOSIS — Z23 Encounter for immunization: Secondary | ICD-10-CM | POA: Diagnosis not present

## 2018-03-25 DIAGNOSIS — Z79899 Other long term (current) drug therapy: Secondary | ICD-10-CM | POA: Diagnosis not present

## 2018-03-25 DIAGNOSIS — C50311 Malignant neoplasm of lower-inner quadrant of right female breast: Secondary | ICD-10-CM | POA: Diagnosis not present

## 2018-03-25 DIAGNOSIS — Z87891 Personal history of nicotine dependence: Secondary | ICD-10-CM | POA: Diagnosis not present

## 2018-03-25 DIAGNOSIS — F419 Anxiety disorder, unspecified: Secondary | ICD-10-CM | POA: Diagnosis not present

## 2018-03-25 DIAGNOSIS — Z5189 Encounter for other specified aftercare: Secondary | ICD-10-CM | POA: Diagnosis not present

## 2018-03-25 DIAGNOSIS — Z5111 Encounter for antineoplastic chemotherapy: Secondary | ICD-10-CM | POA: Diagnosis not present

## 2018-03-25 DIAGNOSIS — Z170 Estrogen receptor positive status [ER+]: Secondary | ICD-10-CM | POA: Diagnosis not present

## 2018-03-25 DIAGNOSIS — Z95828 Presence of other vascular implants and grafts: Secondary | ICD-10-CM

## 2018-03-25 MED ORDER — PEGFILGRASTIM-CBQV 6 MG/0.6ML ~~LOC~~ SOSY
6.0000 mg | PREFILLED_SYRINGE | Freq: Once | SUBCUTANEOUS | Status: AC
  Administered 2018-03-25: 6 mg via SUBCUTANEOUS

## 2018-03-30 ENCOUNTER — Telehealth: Payer: Self-pay | Admitting: Adult Health

## 2018-03-30 ENCOUNTER — Inpatient Hospital Stay: Payer: Medicare HMO

## 2018-03-30 ENCOUNTER — Inpatient Hospital Stay (HOSPITAL_BASED_OUTPATIENT_CLINIC_OR_DEPARTMENT_OTHER): Payer: Medicare HMO | Admitting: Adult Health

## 2018-03-30 ENCOUNTER — Encounter: Payer: Self-pay | Admitting: Adult Health

## 2018-03-30 VITALS — BP 126/71 | HR 85 | Temp 98.3°F | Resp 18 | Ht 63.0 in | Wt 174.3 lb

## 2018-03-30 DIAGNOSIS — F419 Anxiety disorder, unspecified: Secondary | ICD-10-CM | POA: Diagnosis not present

## 2018-03-30 DIAGNOSIS — Z170 Estrogen receptor positive status [ER+]: Secondary | ICD-10-CM

## 2018-03-30 DIAGNOSIS — C50311 Malignant neoplasm of lower-inner quadrant of right female breast: Secondary | ICD-10-CM

## 2018-03-30 DIAGNOSIS — Z87891 Personal history of nicotine dependence: Secondary | ICD-10-CM

## 2018-03-30 DIAGNOSIS — Z79899 Other long term (current) drug therapy: Secondary | ICD-10-CM | POA: Diagnosis not present

## 2018-03-30 DIAGNOSIS — M8580 Other specified disorders of bone density and structure, unspecified site: Secondary | ICD-10-CM

## 2018-03-30 DIAGNOSIS — Z9011 Acquired absence of right breast and nipple: Secondary | ICD-10-CM

## 2018-03-30 DIAGNOSIS — Z23 Encounter for immunization: Secondary | ICD-10-CM

## 2018-03-30 DIAGNOSIS — Z5111 Encounter for antineoplastic chemotherapy: Secondary | ICD-10-CM | POA: Diagnosis not present

## 2018-03-30 DIAGNOSIS — Z5189 Encounter for other specified aftercare: Secondary | ICD-10-CM | POA: Diagnosis not present

## 2018-03-30 LAB — CBC WITH DIFFERENTIAL/PLATELET
Abs Immature Granulocytes: 0.03 10*3/uL (ref 0.00–0.07)
Basophils Absolute: 0 10*3/uL (ref 0.0–0.1)
Basophils Relative: 1 %
Eosinophils Absolute: 0.1 10*3/uL (ref 0.0–0.5)
Eosinophils Relative: 1 %
HEMATOCRIT: 34.9 % — AB (ref 36.0–46.0)
Hemoglobin: 11.3 g/dL — ABNORMAL LOW (ref 12.0–15.0)
Immature Granulocytes: 1 %
Lymphocytes Relative: 17 %
Lymphs Abs: 0.8 10*3/uL (ref 0.7–4.0)
MCH: 33.5 pg (ref 26.0–34.0)
MCHC: 32.4 g/dL (ref 30.0–36.0)
MCV: 103.6 fL — ABNORMAL HIGH (ref 80.0–100.0)
MONO ABS: 0.7 10*3/uL (ref 0.1–1.0)
Monocytes Relative: 13 %
Neutro Abs: 3.4 10*3/uL (ref 1.7–7.7)
Neutrophils Relative %: 67 %
Platelets: 99 10*3/uL — ABNORMAL LOW (ref 150–400)
RBC: 3.37 MIL/uL — ABNORMAL LOW (ref 3.87–5.11)
RDW: 14.8 % (ref 11.5–15.5)
WBC: 5 10*3/uL (ref 4.0–10.5)
nRBC: 0 % (ref 0.0–0.2)

## 2018-03-30 LAB — COMPREHENSIVE METABOLIC PANEL
ALT: 16 U/L (ref 0–44)
AST: 15 U/L (ref 15–41)
Albumin: 4 g/dL (ref 3.5–5.0)
Alkaline Phosphatase: 128 U/L — ABNORMAL HIGH (ref 38–126)
Anion gap: 8 (ref 5–15)
BUN: 11 mg/dL (ref 8–23)
CALCIUM: 9 mg/dL (ref 8.9–10.3)
CO2: 27 mmol/L (ref 22–32)
Chloride: 103 mmol/L (ref 98–111)
Creatinine, Ser: 0.76 mg/dL (ref 0.44–1.00)
GFR calc Af Amer: >60 mL/min (ref >60)
GFR calc non Af Amer: >60 mL/min (ref >60)
Glucose, Bld: 123 mg/dL — ABNORMAL HIGH (ref 70–99)
Potassium: 4.2 mmol/L (ref 3.5–5.1)
Sodium: 138 mmol/L (ref 135–145)
Total Bilirubin: 0.4 mg/dL (ref 0.3–1.2)
Total Protein: 6.8 g/dL (ref 6.5–8.1)

## 2018-03-30 MED ORDER — INFLUENZA VAC SPLIT QUAD 0.5 ML IM SUSY
PREFILLED_SYRINGE | INTRAMUSCULAR | Status: AC
  Filled 2018-03-30: qty 0.5

## 2018-03-30 MED ORDER — INFLUENZA VAC SPLIT QUAD 0.5 ML IM SUSY
0.5000 mL | PREFILLED_SYRINGE | Freq: Once | INTRAMUSCULAR | Status: AC
  Administered 2018-03-30: 0.5 mL via INTRAMUSCULAR

## 2018-03-30 NOTE — Telephone Encounter (Signed)
No 12/18 los. °

## 2018-03-30 NOTE — Progress Notes (Signed)
[facility]  [phone] [phone]     ID: Lauren Holt DOB: 1941-02-02  MR#: [id number]  JJK#:[id number]  Patient Care Team: Alroy Dust, Carlean Jews.Marlou Sa, MD as PCP - General (Family Medicine) Jovita Kussmaul, MD as Consulting Physician (General Surgery) Magrinat, Virgie Dad, MD as Consulting Physician (Oncology) Kyung Rudd, MD as Consulting Physician (Radiation Oncology) OTHER MD:  CHIEF COMPLAINT: Estrogen receptor positive breast cancer  CURRENT TREATMENT: Adjuvant chemotherapy   HISTORY OF CURRENT ILLNESS: On the original intake note:  Lauren Holt had routine screening mammography on 06/14/2017 showing a possible area of calcifications in the right breast. She underwent unilateral right diagnostic mammography with tomography at [facility] on 06/23/2017 showing: Suspicious calcifications within the inferior right breast, spanning nearly 10 cm, more superior component spanning 4 cm extent. The more superior component may have a small associated mass. Ultrasonography of the right axilla on 07/01/2017 showed normal right axillary lymph nodes.   Accordingly on 06/29/2017 she proceeded to biopsy of the right breast area in question. The pathology from this procedure showed ([id number]): In the right breast lower central, more lateral superior: invasive ductal carcinoma grade I. In the lower central, more medial inferior: Ductal carcinoma in situ, high grade, with calcifications. The  invasive tumor was significant for estrogen receptor, 100% positive and progesterone receptor, 10% positive, both with strong staining intensity. Proliferation marker Ki67 at 5%. HER2  not amplified. The ductal carcinoma in situ was significant for estrogen receptor, 100% positive and progesterone receptor, 80% positive, both with strong staining intensity.   The patient's subsequent history is as detailed below.  INTERVAL HISTORY: Lauren Holt returns today for follow up and  treatment of her estrogen receptor positive breast cancer. She receives adjuvant chemotherapy with CMF given every 21 days for 8 planned cycles. Today is day 8 cycle 7.  Lauren Holt continues to tolerate her treatment well.   REVIEW OF SYSTEMS: She is doing well today.  She does have some days where she is constipated and some days where she has more frequent bowel movements.  As many as 4 per day. She notes over the past week she has been feeling okay with her bowel movements though.  She was going to have a liver mri to evaluate two lesions on a previous CT scan that were indeterminate.  This has to be delayed since she still has an expander in place.   Lauren Holt denies headaches or vision changes. She has some mild taste changes.  She is eating and drinking well and denies nausea, vomiting, dysphagia.  She denies bladder changes.  She is without chest pain, palpitations, cough, or shortness of breath.  A detailed ROS was otherwise non contributory.      PAST MEDICAL HISTORY: Past Medical History:  Diagnosis Date  . Anxiety   . Breast cancer, right breast ([place])   . Complication of anesthesia    difficult to wake - many years ago with tubal 50 years ago  . Dermatitis   . Family history of adverse reaction to anesthesia    "I think my sister was hard to wake up" (09/24/2017)  . Family history of breast cancer 07/12/2017  . Family history of prostate cancer 07/12/2017  . Genetic testing   . Palpitations    when she lays down, has not seen anyone for this, feels like a flutter   HLD ( but not too high)   PAST SURGICAL HISTORY: Past Surgical History:  Procedure Laterality Date  . BREAST BIOPSY Right  06/2017  . BREAST RECONSTRUCTION WITH PLACEMENT OF TISSUE EXPANDER AND ALLODERM Right 09/24/2017  . BREAST RECONSTRUCTION WITH PLACEMENT OF TISSUE EXPANDER AND ALLODERM Right 09/24/2017   Procedure: BREAST RECONSTRUCTION WITH PLACEMENT OF TISSUE EXPANDER AND ALLODERM;  Surgeon: Irene Limbo, MD;   Location: [facility];  Service: Plastics;  Laterality: Right;  . CARPAL TUNNEL RELEASE Bilateral   . COLONOSCOPY    . MASTECTOMY COMPLETE / SIMPLE W/ SENTINEL NODE BIOPSY Right 09/24/2017  . MASTECTOMY W/ SENTINEL NODE BIOPSY Right 09/24/2017   Procedure: MASTECTOMY WITH SENTINEL LYMPH NODE BIOPSY;  Surgeon: Jovita Kussmaul, MD;  Location: [facility];  Service: General;  Laterality: Right;  . PORTACATH PLACEMENT Left 11/08/2017   Procedure: INSERTION PORT-A-CATH;  Surgeon: Jovita Kussmaul, MD;  Location: [facility];  Service: General;  Laterality: Left;  . TUBAL LIGATION    . VAGINAL HYSTERECTOMY     Partial Hysterectomy without BSO. Because they were too close to the bladder.    FAMILY HISTORY Family History  Problem Relation Age of Onset  . Breast cancer Paternal Aunt   . Breast cancer Cousin 61  . Prostate cancer Father 57  . Breast cancer Cousin 55  . Breast cancer Cousin 25  . Breast cancer Cousin 58  . Breast cancer Other    The patient's father died at age 72 due to emphysema. The patient's mother is alive at age 63 as of March 2019. The patient has 5 brothers and 3 sisters. There was a paternal great aunt with breast cancer. There was also a paternal cousin with breast cancer.   GYNECOLOGIC HISTORY:  No LMP recorded. Patient has had a hysterectomy. Menarche: 77 years old Age at first live birth: 77 years old The patient is GXP3. She is s/p partial hysterectomy. Both of her ovaries remain. She used oral contraceptives with no complications. She also used HRT for 2 years without complications.     SOCIAL HISTORY:  Lauren Holt is now retired. 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 She is divorced. Her oldest daughter, Lauren Holt", is a 5th Land in [place], [place]. The patient's son, Lauren Holt, works in a Proofreader in [place]. The patient's son, Lauren Holt, works in Quarry manager in [place].  The patient has 4 grandchildren. She currently does not  belong to a church.     ADVANCED DIRECTIVES: Not in place.  At the 07/07/2017 visit the patient was given the appropriate documents to complete and notarized at her discretion.  HEALTH MAINTENANCE: Social History   Tobacco Use  . Smoking status: Former Smoker    Years: 1.00    Types: Cigarettes  . Smokeless tobacco: Never Used  . Tobacco comment: Smoked X 1 year at age 83 (less than ppd)  Substance Use Topics  . Alcohol use: Not Currently  . Drug use: Never     Colonoscopy: 02/2017 at [facility]  PAP:   Bone density: 2018/ osteopenia   No Known Allergies  Current Outpatient Medications  Medication Sig Dispense Refill  . ALPRAZolam (XANAX) 0.25 MG tablet Take 0.125 mg by mouth 2 (two) times daily as needed for anxiety.     Marland Kitchen aspirin 325 MG tablet Take 325 mg by mouth daily.    Marland Kitchen BETA CAROTENE PO Take 7,500 mcg by mouth daily.    . calcium carbonate (CALCIUM 600) 600 MG TABS tablet Take 600 mg by mouth daily.    . Cholecalciferol (VITAMIN D-3) 5000 units  TABS Take 1 tablet by mouth daily.    . ciprofloxacin (CIPRO) 500 MG tablet Take 500 mg by mouth 2 (two) times daily. for 7 days  0  . lidocaine-prilocaine (EMLA) cream Apply 1 application topically as needed. 30 g 1  . Lutein 20 MG CAPS Take 20 mg by mouth daily.     . Magnesium Citrate 100 MG TABS Take 100 mg by mouth daily.     . metroNIDAZOLE (FLAGYL) 500 MG tablet Take 500 mg by mouth every 8 (eight) hours.  0  . Multiple Vitamins-Minerals (ICAPS AREDS FORMULA PO) Take 1 capsule by mouth 3 (three) times a week.     . Probiotic Product (PROBIOTIC DAILY PO) Take 1 capsule by mouth 3 (three) times a week.     . prochlorperazine (COMPAZINE) 10 MG tablet Take 1 tablet (10 mg total) by mouth every 6 (six) hours as needed for nausea or vomiting. 30 tablet 0  . Red Yeast Rice 600 MG CAPS Take 600 mg by mouth daily.     Marland Kitchen VITAMIN E PO Take 180 mg by mouth daily.    Marland Kitchen zinc gluconate 50 MG tablet Take 50 mg by mouth daily as  needed (cold prevention).     No current facility-administered medications for this visit.     OBJECTIVE:  Vitals:   03/30/18 1136  BP: 126/71  Pulse: 85  Resp: 18  Temp: 98.3 F (36.8 C)  SpO2: 100%     Body mass index is 30.88 kg/m.   Wt Readings from Last 3 Encounters:  03/30/18 174 lb 4.8 oz (79.1 kg)  03/02/18 178 lb 14.4 oz (81.1 kg)  01/26/18 177 lb 11.2 oz (80.6 kg)  ECOG FS:1 - Symptomatic but completely ambulatory GENERAL: Patient is a well appearing female in no acute distress HEENT:  Sclerae anicteric.  Oropharynx clear and moist. No ulcerations or evidence of oropharyngeal candidiasis. Neck is supple.  NODES:  No cervical, supraclavicular, or axillary lymphadenopathy palpated.  BREAST  EXAM:  Declined, examined a couple of weeks ago LUNGS:  Clear to auscultation bilaterally.  No wheezes or rhonchi. HEART:  Regular rate and rhythm. No murmur appreciated. ABDOMEN:  Soft, mildly tender in LLQ.  Positive, normoactive bowel sounds. No organomegaly palpated. MSK:  No focal spinal tenderness to palpation. Full range of motion bilaterally in the upper extremities. EXTREMITIES:  No peripheral edema.   SKIN:  Clear with no obvious rashes or skin changes. No nail dyscrasia. NEURO:  Nonfocal. Well oriented.  Appropriate affect.   LAB RESULTS:  CMP     Component Value Date/Time   NA 138 03/30/2018 1053   K 4.2 03/30/2018 1053   CL 103 03/30/2018 1053   CO2 27 03/30/2018 1053   GLUCOSE 123 (H) 03/30/2018 1053   BUN 11 03/30/2018 1053   CREATININE 0.76 03/30/2018 1053   CREATININE 0.71 12/15/2017 1049   CALCIUM 9.0 03/30/2018 1053   PROT 6.8 03/30/2018 1053   ALBUMIN 4.0 03/30/2018 1053   AST 15 03/30/2018 1053   AST 15 12/15/2017 1049   ALT 16 03/30/2018 1053   ALT 13 12/15/2017 1049   ALKPHOS 128 (H) 03/30/2018 1053   BILITOT 0.4 03/30/2018 1053   BILITOT 0.4 12/15/2017 1049   GFRNONAA >60 03/30/2018 1053   GFRNONAA >60 12/15/2017 1049   GFRAA >60 03/30/2018  1053   GFRAA >60 12/15/2017 1049    No results found for: TOTALPROTELP, ALBUMINELP, A1GS, A2GS, BETS, BETA2SER, GAMS, MSPIKE, SPEI  No results found  for: KPAFRELGTCHN, LAMBDASER, [facility]  Lab Results  Component Value Date   WBC 5.0 03/30/2018   NEUTROABS 3.4 03/30/2018   HGB 11.3 (L) 03/30/2018   HCT 34.9 (L) 03/30/2018   MCV 103.6 (H) 03/30/2018   PLT 99 (L) 03/30/2018    '@LASTCHEMISTRY'$ @  No results found for: LABCA2  No components found for: MOQHUT654  No results for input(s): INR in the last 168 hours.  No results found for: LABCA2  No results found for: YTK354  No results found for: SFK812  No results found for: XNT700  No results found for: CA2729  No components found for: HGQUANT  No results found for: CEA1 / No results found for: CEA1   No results found for: AFPTUMOR  No results found for: CHROMOGRNA  No results found for: PSA1  Appointment on 03/30/2018  Component Date Value Ref Range Status  . Sodium 03/30/2018 138  135 - 145 mmol/L Final  . Potassium 03/30/2018 4.2  3.5 - 5.1 mmol/L Final  . Chloride 03/30/2018 103  98 - 111 mmol/L Final  . CO2 03/30/2018 27  22 - 32 mmol/L Final  . Glucose, Bld 03/30/2018 123* 70 - 99 mg/dL Final  . BUN 03/30/2018 11  8 - 23 mg/dL Final  . Creatinine, Ser 03/30/2018 0.76  0.44 - 1.00 mg/dL Final  . Calcium 03/30/2018 9.0  8.9 - 10.3 mg/dL Final  . Total Protein 03/30/2018 6.8  6.5 - 8.1 g/dL Final  . Albumin 03/30/2018 4.0  3.5 - 5.0 g/dL Final  . AST 03/30/2018 15  15 - 41 U/L Final  . ALT 03/30/2018 16  0 - 44 U/L Final  . Alkaline Phosphatase 03/30/2018 128* 38 - 126 U/L Final  . Total Bilirubin 03/30/2018 0.4  0.3 - 1.2 mg/dL Final  . GFR calc non Af Amer 03/30/2018 >60  >60 mL/min Final  . GFR calc Af Amer 03/30/2018 >60  >60 mL/min Final  . Anion gap 03/30/2018 8  5 - 15 Final   Performed at [facility], [place] [address], [place], [place] [address]  . WBC 03/30/2018 5.0   4.0 - 10.5 K/uL Final  . RBC 03/30/2018 3.37* 3.87 - 5.11 MIL/uL Final  . Hemoglobin 03/30/2018 11.3* 12.0 - 15.0 g/dL Final  . HCT 03/30/2018 34.9* 36.0 - 46.0 % Final  . MCV 03/30/2018 103.6* 80.0 - 100.0 fL Final  . MCH 03/30/2018 33.5  26.0 - 34.0 pg Final  . MCHC 03/30/2018 32.4  30.0 - 36.0 g/dL Final  . RDW 03/30/2018 14.8  11.5 - 15.5 % Final  . Platelets 03/30/2018 99* 150 - 400 K/uL Final  . nRBC 03/30/2018 0.0  0.0 - 0.2 % Final  . Neutrophils Relative % 03/30/2018 67  % Final  . Neutro Abs 03/30/2018 3.4  1.7 - 7.7 K/uL Final  . Lymphocytes Relative 03/30/2018 17  % Final  . Lymphs Abs 03/30/2018 0.8  0.7 - 4.0 K/uL Final  . Monocytes Relative 03/30/2018 13  % Final  . Monocytes Absolute 03/30/2018 0.7  0.1 - 1.0 K/uL Final  . Eosinophils Relative 03/30/2018 1  % Final  . Eosinophils Absolute 03/30/2018 0.1  0.0 - 0.5 K/uL Final  . Basophils Relative 03/30/2018 1  % Final  . Basophils Absolute 03/30/2018 0.0  0.0 - 0.1 K/uL Final  . Immature Granulocytes 03/30/2018 1  % Final  . Abs Immature Granulocytes 03/30/2018 0.03  0.00 - 0.07 K/uL Final   Performed at [facility], [place] [address], [place], [place] [address]    (this displays the last labs from the last 3 days)  No results found for: TOTALPROTELP, ALBUMINELP, A1GS, A2GS, BETS, BETA2SER, GAMS, MSPIKE, SPEI (this displays SPEP labs)  No results found for: KPAFRELGTCHN, LAMBDASER, KAPLAMBRATIO (kappa/lambda light chains)  No results found for: HGBA, HGBA2QUANT, HGBFQUANT, HGBSQUAN (Hemoglobinopathy evaluation)   No results found for: LDH  No results found for: IRON, TIBC, IRONPCTSAT (Iron and TIBC)  No results found for: FERRITIN  Urinalysis No results found for: COLORURINE, APPEARANCEUR, LABSPEC, PHURINE, GLUCOSEU, HGBUR, BILIRUBINUR, KETONESUR, PROTEINUR, UROBILINOGEN, NITRITE, LEUKOCYTESUR   STUDIES: No results found.  ELIGIBLE FOR AVAILABLE RESEARCH PROTOCOL: exact sciences  study  ASSESSMENT: 77 y.o. [place], [place] woman status post right breast biopsy 07/01/2017 for a clinical mT2 N0, stage IB invasive ductal carcinoma, grade 1, estrogen and progesterone receptor positive, HER-2 nonamplified, with an MIB-105%  (1) a second area in the right breast biopsied 07/01/2017 showed ductal carcinoma in situ, grade 3, estrogen and progesterone receptor positive.  (2) status post right mastectomy and sentinel lymph node sampling 09/24/2017 for a pT1b pN0, stage IA invasive ductal carcinoma, grade 1, with negative margins  (a) a total of 5 sentinel lymph nodes were removed  (3) The Oncotype DX score was 28, predicting a risk of outside the breast recurrence over the next 9 years of 17% if the patient's only systemic therapy is tamoxifen for 5 years.  It also predicts a significant benefit from chemotherapy.  (4) cyclophosphamide, methotrexate and fluorouracil (CMF) chemotherapy started 11/17/2017, repeated every 21 days x 8  (a) Udenyca added with cycle 4 and subsequent cycles due to neutropenia  (5) anastrozole started 08/04/2016, interrupted during chemotherapy  (6) genetics testing 07/22/2017 through the Common Hereditary Cancer Panel offered by [facility] found no deleterious mutations in APC, ATM, AXIN2, BARD1, BMPR1A, BRCA1, BRCA2, BRIP1, CDH1, CDKN2A (p14ARF), CDKN2A (p16INK4a), CKD4, CHEK2, CTNNA1, DICER1, EPCAM (Deletion/duplication testing only), GREM1 (promoter region deletion/duplication testing only), KIT, MEN1, MLH1, MSH2, MSH3, MSH6, MUTYH, NBN, NF1, NHTL1, PALB2, PDGFRA, PMS2, POLD1, POLE, PTEN, RAD50, RAD51C, RAD51D, SDHB, SDHC, SDHD, SMAD4, SMARCA4. STK11, TP53, TSC1, TSC2, and VHL.  The following genes were evaluated for sequence changes only: SDHA and HOXB13 c.251G>A variant only.   (a) a variant of uncertain significance in the gene DICER1 was identified.  c.2116+6G>A (Intronic)  (7) CT A/P done on 02/11/2018 for LLQ pain, no cause for pain identified, but two  indeterminate liver lesions were noted  (a) MRI liver to be done once she has undergone implant exchange.   PLAN: Terra is doing well today.  Her labs are stable today.  She tolerated her chemotherapy well.  She has one more cycle left to go.  She is looking forward to getting this last one over with.  She wonders if she will need the Udenyca on 1/2 since it will be her last chemotherapy.  She would rather not receive it if she doesn't have to.  She will ask her nurse to call me on 12/31 when she is here for chemo with her WBC and ANC and we will decide at that time.    Mulki will return in 2 weeks for labs, f/u and cycle 8 of CMF.  She will see Dr. Jana Hakim one week after to evaluate how she tolerated it.  She knows to call for any questions or concerns prior to her next appointment with [place].    A total of (20) minutes of face-to-face time  was spent with this patient with greater than 50% of that time in counseling and care-coordination.   Wilber Bihari, NP  03/30/18 1:04 PM Medical Oncology and Hematology [facility] [address] [place], [place] [address] Tel. [phone]    Fax. [phone]

## 2018-04-12 ENCOUNTER — Inpatient Hospital Stay: Payer: Medicare HMO

## 2018-04-12 ENCOUNTER — Telehealth: Payer: Self-pay | Admitting: Oncology

## 2018-04-12 ENCOUNTER — Other Ambulatory Visit: Payer: Self-pay | Admitting: Adult Health

## 2018-04-12 VITALS — BP 127/70 | HR 78 | Temp 97.6°F | Resp 18

## 2018-04-12 DIAGNOSIS — Z170 Estrogen receptor positive status [ER+]: Principal | ICD-10-CM

## 2018-04-12 DIAGNOSIS — C50311 Malignant neoplasm of lower-inner quadrant of right female breast: Secondary | ICD-10-CM

## 2018-04-12 DIAGNOSIS — Z23 Encounter for immunization: Secondary | ICD-10-CM | POA: Diagnosis not present

## 2018-04-12 DIAGNOSIS — Z5111 Encounter for antineoplastic chemotherapy: Secondary | ICD-10-CM | POA: Diagnosis not present

## 2018-04-12 DIAGNOSIS — M8580 Other specified disorders of bone density and structure, unspecified site: Secondary | ICD-10-CM | POA: Diagnosis not present

## 2018-04-12 DIAGNOSIS — R399 Unspecified symptoms and signs involving the genitourinary system: Secondary | ICD-10-CM

## 2018-04-12 DIAGNOSIS — F419 Anxiety disorder, unspecified: Secondary | ICD-10-CM | POA: Diagnosis not present

## 2018-04-12 DIAGNOSIS — Z5189 Encounter for other specified aftercare: Secondary | ICD-10-CM | POA: Diagnosis not present

## 2018-04-12 DIAGNOSIS — Z87891 Personal history of nicotine dependence: Secondary | ICD-10-CM | POA: Diagnosis not present

## 2018-04-12 DIAGNOSIS — Z79899 Other long term (current) drug therapy: Secondary | ICD-10-CM | POA: Diagnosis not present

## 2018-04-12 DIAGNOSIS — Z95828 Presence of other vascular implants and grafts: Secondary | ICD-10-CM

## 2018-04-12 LAB — URINALYSIS, COMPLETE (UACMP) WITH MICROSCOPIC
Bacteria, UA: NONE SEEN
Bilirubin Urine: NEGATIVE
GLUCOSE, UA: NEGATIVE mg/dL
HGB URINE DIPSTICK: NEGATIVE
Ketones, ur: NEGATIVE mg/dL
Leukocytes, UA: NEGATIVE
NITRITE: NEGATIVE
Protein, ur: NEGATIVE mg/dL
Specific Gravity, Urine: 1.003 — ABNORMAL LOW (ref 1.005–1.030)
pH: 6 (ref 5.0–8.0)

## 2018-04-12 LAB — COMPREHENSIVE METABOLIC PANEL
ALT: 30 U/L (ref 0–44)
AST: 24 U/L (ref 15–41)
Albumin: 3.8 g/dL (ref 3.5–5.0)
Alkaline Phosphatase: 118 U/L (ref 38–126)
Anion gap: 9 (ref 5–15)
BILIRUBIN TOTAL: 0.3 mg/dL (ref 0.3–1.2)
BUN: 15 mg/dL (ref 8–23)
CO2: 26 mmol/L (ref 22–32)
Calcium: 9.3 mg/dL (ref 8.9–10.3)
Chloride: 104 mmol/L (ref 98–111)
Creatinine, Ser: 0.7 mg/dL (ref 0.44–1.00)
GFR calc Af Amer: >60 mL/min (ref >60)
Glucose, Bld: 115 mg/dL — ABNORMAL HIGH (ref 70–99)
Potassium: 4.2 mmol/L (ref 3.5–5.1)
Sodium: 139 mmol/L (ref 135–145)
TOTAL PROTEIN: 7 g/dL (ref 6.5–8.1)

## 2018-04-12 LAB — CBC WITH DIFFERENTIAL/PLATELET
Abs Immature Granulocytes: 0.02 10*3/uL (ref 0.00–0.07)
BASOS PCT: 0 %
Basophils Absolute: 0 10*3/uL (ref 0.0–0.1)
Eosinophils Absolute: 0 10*3/uL (ref 0.0–0.5)
Eosinophils Relative: 1 %
HCT: 36.1 % (ref 36.0–46.0)
Hemoglobin: 11.7 g/dL — ABNORMAL LOW (ref 12.0–15.0)
Immature Granulocytes: 0 %
Lymphocytes Relative: 13 %
Lymphs Abs: 1.1 10*3/uL (ref 0.7–4.0)
MCH: 33.6 pg (ref 26.0–34.0)
MCHC: 32.4 g/dL (ref 30.0–36.0)
MCV: 103.7 fL — ABNORMAL HIGH (ref 80.0–100.0)
Monocytes Absolute: 0.7 10*3/uL (ref 0.1–1.0)
Monocytes Relative: 9 %
NRBC: 0 % (ref 0.0–0.2)
Neutro Abs: 6.1 10*3/uL (ref 1.7–7.7)
Neutrophils Relative %: 77 %
Platelets: 205 10*3/uL (ref 150–400)
RBC: 3.48 MIL/uL — ABNORMAL LOW (ref 3.87–5.11)
RDW: 14.5 % (ref 11.5–15.5)
WBC: 8 10*3/uL (ref 4.0–10.5)

## 2018-04-12 MED ORDER — PALONOSETRON HCL INJECTION 0.25 MG/5ML
0.2500 mg | Freq: Once | INTRAVENOUS | Status: AC
  Administered 2018-04-12: 0.25 mg via INTRAVENOUS

## 2018-04-12 MED ORDER — DEXAMETHASONE SODIUM PHOSPHATE 10 MG/ML IJ SOLN
INTRAMUSCULAR | Status: AC
  Filled 2018-04-12: qty 1

## 2018-04-12 MED ORDER — SODIUM CHLORIDE 0.9% FLUSH
10.0000 mL | INTRAVENOUS | Status: DC | PRN
  Administered 2018-04-12: 10 mL
  Filled 2018-04-12: qty 10

## 2018-04-12 MED ORDER — METHOTREXATE SODIUM (PF) CHEMO INJECTION 250 MG/10ML
39.7000 mg/m2 | Freq: Once | INTRAMUSCULAR | Status: AC
  Administered 2018-04-12: 75 mg via INTRAVENOUS
  Filled 2018-04-12: qty 3

## 2018-04-12 MED ORDER — DEXAMETHASONE SODIUM PHOSPHATE 10 MG/ML IJ SOLN
10.0000 mg | Freq: Once | INTRAMUSCULAR | Status: AC
  Administered 2018-04-12: 10 mg via INTRAVENOUS

## 2018-04-12 MED ORDER — PALONOSETRON HCL INJECTION 0.25 MG/5ML
INTRAVENOUS | Status: AC
  Filled 2018-04-12: qty 5

## 2018-04-12 MED ORDER — SODIUM CHLORIDE 0.9 % IV SOLN
Freq: Once | INTRAVENOUS | Status: AC
  Administered 2018-04-12: 14:00:00 via INTRAVENOUS
  Filled 2018-04-12: qty 250

## 2018-04-12 MED ORDER — FLUOROURACIL CHEMO INJECTION 2.5 GM/50ML
600.0000 mg/m2 | Freq: Once | INTRAVENOUS | Status: AC
  Administered 2018-04-12: 1150 mg via INTRAVENOUS
  Filled 2018-04-12: qty 23

## 2018-04-12 MED ORDER — SODIUM CHLORIDE 0.9 % IV SOLN
600.0000 mg/m2 | Freq: Once | INTRAVENOUS | Status: AC
  Administered 2018-04-12: 1140 mg via INTRAVENOUS
  Filled 2018-04-12: qty 57

## 2018-04-12 MED ORDER — HEPARIN SOD (PORK) LOCK FLUSH 100 UNIT/ML IV SOLN
500.0000 [IU] | Freq: Once | INTRAVENOUS | Status: AC | PRN
  Administered 2018-04-12: 500 [IU]
  Filled 2018-04-12: qty 5

## 2018-04-12 NOTE — Progress Notes (Signed)
NP Mendel Ryder reviewed pt's labs today, ok to proceed with treatment, no Neulasta per verbal order.  Also per NP Mendel Ryder pt to give urine sample for urinalysis/culture since she's reporting dysuria/polyuria.  Afebrile.  Per Cecille Rubin RN for NP Mendel Ryder pt's urine showed no sign of infection.  Pt verbalized understanding.  Will call with any worsening symptoms or further concerns.  Advised to increase oral fluids at home.

## 2018-04-12 NOTE — Telephone Encounter (Signed)
Canceled appt per 12/31 sch message - left message for patient with appt date and time

## 2018-04-12 NOTE — Patient Instructions (Signed)
[facility] Discharge Instructions for Patients Receiving Chemotherapy  Today you received the following chemotherapy agents Cytoxan, Methotrexate, Adrucil  To help prevent nausea and vomiting after your treatment, we encourage you to take your nausea medication as directed   If you develop nausea and vomiting that is not controlled by your nausea medication, call the clinic.   BELOW ARE SYMPTOMS THAT SHOULD BE REPORTED IMMEDIATELY:  *FEVER GREATER THAN 100.5 F  *CHILLS WITH OR WITHOUT FEVER  NAUSEA AND VOMITING THAT IS NOT CONTROLLED WITH YOUR NAUSEA MEDICATION  *UNUSUAL SHORTNESS OF BREATH  *UNUSUAL BRUISING OR BLEEDING  TENDERNESS IN MOUTH AND THROAT WITH OR WITHOUT PRESENCE OF ULCERS  *URINARY PROBLEMS  *BOWEL PROBLEMS  UNUSUAL RASH Items with * indicate a potential emergency and should be followed up as soon as possible.  Feel free to call the clinic should you have any questions or concerns. The clinic phone number is [phone].  Please show the [facility] at check-in to the Emergency Department and triage nurse.

## 2018-04-13 HISTORY — PX: CATARACT EXTRACTION W/ INTRAOCULAR LENS IMPLANT: SHX1309

## 2018-04-13 LAB — URINE CULTURE: Culture: <10000 — AB

## 2018-04-14 ENCOUNTER — Ambulatory Visit: Payer: Medicare HMO

## 2018-04-14 ENCOUNTER — Other Ambulatory Visit: Payer: Medicare HMO

## 2018-04-14 ENCOUNTER — Inpatient Hospital Stay: Payer: Medicare HMO

## 2018-04-20 ENCOUNTER — Ambulatory Visit: Payer: Medicare HMO | Admitting: Oncology

## 2018-04-20 ENCOUNTER — Other Ambulatory Visit: Payer: Medicare HMO

## 2018-05-17 ENCOUNTER — Other Ambulatory Visit: Payer: Self-pay | Admitting: Oncology

## 2018-05-18 ENCOUNTER — Inpatient Hospital Stay: Payer: Medicare HMO | Attending: Oncology | Admitting: Oncology

## 2018-05-18 ENCOUNTER — Inpatient Hospital Stay: Payer: Medicare HMO

## 2018-05-18 ENCOUNTER — Encounter: Payer: Self-pay | Admitting: Oncology

## 2018-05-18 ENCOUNTER — Telehealth: Payer: Self-pay | Admitting: Oncology

## 2018-05-18 VITALS — BP 156/77 | HR 81 | Temp 98.2°F | Resp 18 | Wt 180.6 lb

## 2018-05-18 DIAGNOSIS — Z79811 Long term (current) use of aromatase inhibitors: Secondary | ICD-10-CM | POA: Insufficient documentation

## 2018-05-18 DIAGNOSIS — Z9011 Acquired absence of right breast and nipple: Secondary | ICD-10-CM | POA: Insufficient documentation

## 2018-05-18 DIAGNOSIS — I890 Lymphedema, not elsewhere classified: Secondary | ICD-10-CM | POA: Diagnosis not present

## 2018-05-18 DIAGNOSIS — C50311 Malignant neoplasm of lower-inner quadrant of right female breast: Secondary | ICD-10-CM | POA: Insufficient documentation

## 2018-05-18 DIAGNOSIS — Z7982 Long term (current) use of aspirin: Secondary | ICD-10-CM | POA: Insufficient documentation

## 2018-05-18 DIAGNOSIS — Z170 Estrogen receptor positive status [ER+]: Principal | ICD-10-CM

## 2018-05-18 DIAGNOSIS — M8580 Other specified disorders of bone density and structure, unspecified site: Secondary | ICD-10-CM | POA: Insufficient documentation

## 2018-05-18 LAB — CBC WITH DIFFERENTIAL/PLATELET
Abs Immature Granulocytes: 0.01 10*3/uL (ref 0.00–0.07)
Basophils Absolute: 0 10*3/uL (ref 0.0–0.1)
Basophils Relative: 0 %
Eosinophils Absolute: 0.1 10*3/uL (ref 0.0–0.5)
Eosinophils Relative: 2 %
HCT: 36.1 % (ref 36.0–46.0)
Hemoglobin: 11.4 g/dL — ABNORMAL LOW (ref 12.0–15.0)
Immature Granulocytes: 0 %
Lymphocytes Relative: 22 %
Lymphs Abs: 1.1 10*3/uL (ref 0.7–4.0)
MCH: 33 pg (ref 26.0–34.0)
MCHC: 31.6 g/dL (ref 30.0–36.0)
MCV: 104.6 fL — ABNORMAL HIGH (ref 80.0–100.0)
MONOS PCT: 8 %
Monocytes Absolute: 0.4 10*3/uL (ref 0.1–1.0)
NEUTROS PCT: 68 %
Neutro Abs: 3.4 10*3/uL (ref 1.7–7.7)
Platelets: 245 10*3/uL (ref 150–400)
RBC: 3.45 MIL/uL — ABNORMAL LOW (ref 3.87–5.11)
RDW: 12.9 % (ref 11.5–15.5)
WBC: 5.1 10*3/uL (ref 4.0–10.5)
nRBC: 0 % (ref 0.0–0.2)

## 2018-05-18 LAB — COMPREHENSIVE METABOLIC PANEL
ALT: 19 U/L (ref 0–44)
AST: 22 U/L (ref 15–41)
Albumin: 3.9 g/dL (ref 3.5–5.0)
Alkaline Phosphatase: 86 U/L (ref 38–126)
Anion gap: 8 (ref 5–15)
BUN: 10 mg/dL (ref 8–23)
CO2: 28 mmol/L (ref 22–32)
Calcium: 9.3 mg/dL (ref 8.9–10.3)
Chloride: 103 mmol/L (ref 98–111)
Creatinine, Ser: 0.73 mg/dL (ref 0.44–1.00)
GFR calc Af Amer: >60 mL/min (ref >60)
GFR calc non Af Amer: >60 mL/min (ref >60)
GLUCOSE: 92 mg/dL (ref 70–99)
Potassium: 4 mmol/L (ref 3.5–5.1)
SODIUM: 139 mmol/L (ref 135–145)
Total Bilirubin: 0.3 mg/dL (ref 0.3–1.2)
Total Protein: 7.1 g/dL (ref 6.5–8.1)

## 2018-05-18 MED ORDER — ANASTROZOLE 1 MG PO TABS: 1.0000 mg | ORAL_TABLET | Freq: Every day | ORAL | 4 refills | Status: DC

## 2018-05-18 NOTE — Telephone Encounter (Signed)
Gave avs and calendar ° °

## 2018-05-18 NOTE — Addendum Note (Signed)
Addended by: Chauncey Cruel on: 05/18/2018 03:47 PM   Modules accepted: Orders

## 2018-05-18 NOTE — Progress Notes (Signed)
Lauren Holt  [phone] [phone]     ID: Lauren Holt DOB: [birth year]  MR#: [id number]  BPZ#:[id number]  Patient Care Team: Alroy Dust, Carlean Jews.Marlou Sa, MD as PCP - General (Family Medicine) Jovita Kussmaul, MD as Consulting Physician (General Surgery) Kaire Stary, Virgie Dad, MD as Consulting Physician (Oncology) Kyung Rudd, MD as Consulting Physician (Radiation Oncology) OTHER MD:  CHIEF COMPLAINT: Estrogen receptor positive breast cancer  CURRENT TREATMENT: Resume anastrozole   HISTORY OF CURRENT ILLNESS: On the original intake note:  Lauren Holt had routine screening mammography on 06/14/2017 showing a possible area of calcifications in the right breast. She underwent unilateral right diagnostic mammography with tomography at [facility] on 06/23/2017 showing: Suspicious calcifications within the inferior right breast, spanning nearly 10 cm, more superior component spanning 4 cm extent. The more superior component may have a small associated mass. Ultrasonography of the right axilla on 07/01/2017 showed normal right axillary lymph nodes.   Accordingly on 06/29/2017 she proceeded to biopsy of the right breast area in question. The pathology from this procedure showed ([id number]): In the right breast lower central, more lateral superior: invasive ductal carcinoma grade I. In the lower central, more medial inferior: Ductal carcinoma in situ, high grade, with calcifications. The  invasive tumor was significant for estrogen receptor, 100% positive and progesterone receptor, 10% positive, both with strong staining intensity. Proliferation marker Ki67 at 5%. HER2  not amplified. The ductal carcinoma in situ was significant for estrogen receptor, 100% positive and progesterone receptor, 80% positive, both with strong staining intensity.   The patient's subsequent history is as detailed below.  INTERVAL HISTORY: Lauren Holt returns today for follow up and  treatment of her estrogen receptor positive breast cancer. She has completed chemotherapy on 04/12/2018.   She reports she had her bone scan in 2019 at her PCP's (Dr. Alroy Dust) office.  Of note, she is scheduled for right breast implant placement on 07/01/2018 under Dr. Iran Planas.  Once she recovers from that we will obtain a liver MRI to clarify the question regarding the earlier scans.   REVIEW OF SYSTEMS: Lauren Holt reports going to [employer] 2-3 times a week. She does cross-training. She reports having trouble falling asleep. She is going out of town from 06/01/2018 through early March to [place] state to visit her mother for her mother's 63th birthday. The patient denies unusual headaches, visual changes, nausea, vomiting, stiff neck, dizziness, or gait imbalance. There has been no cough, phlegm production, or pleurisy, no chest pain or pressure, and no change in bowel or bladder habits. The patient denies fever, rash, bleeding, unexplained fatigue or unexplained weight loss. A detailed review of systems was otherwise entirely negative.    PAST MEDICAL HISTORY: Past Medical History:  Diagnosis Date  . Anxiety   . Breast cancer, right breast ([place])   . Complication of anesthesia    difficult to wake - many years ago with tubal 50 years ago  . Dermatitis   . Family history of adverse reaction to anesthesia    "I think my sister was hard to wake up" (09/24/2017)  . Family history of breast cancer 07/12/2017  . Family history of prostate cancer 07/12/2017  . Genetic testing   . Palpitations    when she lays down, has not seen anyone for this, feels like a flutter   HLD ( but not too high)   PAST SURGICAL HISTORY: Past Surgical History:  Procedure Laterality Date  . BREAST BIOPSY Right 06/2017  . BREAST RECONSTRUCTION  WITH PLACEMENT OF TISSUE EXPANDER AND ALLODERM Right 09/24/2017  . BREAST RECONSTRUCTION WITH PLACEMENT OF TISSUE EXPANDER AND ALLODERM Right 09/24/2017   Procedure:  BREAST RECONSTRUCTION WITH PLACEMENT OF TISSUE EXPANDER AND ALLODERM;  Surgeon: Irene Limbo, MD;  Location: [facility];  Service: Plastics;  Laterality: Right;  . CARPAL TUNNEL RELEASE Bilateral   . COLONOSCOPY    . MASTECTOMY COMPLETE / SIMPLE W/ SENTINEL NODE BIOPSY Right 09/24/2017  . MASTECTOMY W/ SENTINEL NODE BIOPSY Right 09/24/2017   Procedure: MASTECTOMY WITH SENTINEL LYMPH NODE BIOPSY;  Surgeon: Jovita Kussmaul, MD;  Location: [facility];  Service: General;  Laterality: Right;  . PORTACATH PLACEMENT Left 11/08/2017   Procedure: INSERTION PORT-A-CATH;  Surgeon: Jovita Kussmaul, MD;  Location: [facility];  Service: General;  Laterality: Left;  . TUBAL LIGATION    . VAGINAL HYSTERECTOMY     Partial Hysterectomy without BSO. Because they were too close to the bladder.    FAMILY HISTORY Family History  Problem Relation Age of Onset  . Breast cancer Paternal Aunt   . Breast cancer Cousin 19  . Stroke Mother        caused blindness  . Prostate cancer Father 14  . Breast cancer Cousin 65  . Breast cancer Cousin 25  . Breast cancer Cousin 23  . Breast cancer Other    The patient's father died at age 38 due to emphysema. The patient's mother is alive at age 32, turning 96 March 2020. The patient has 5 brothers and 3 sisters. There was a paternal great aunt with breast cancer. There was also a paternal cousin with breast cancer.   GYNECOLOGIC HISTORY:  No LMP recorded. Patient has had a hysterectomy. Menarche: 78 years old Age at first live birth: 78 years old The patient is GXP3. She is s/p partial hysterectomy. Both of her ovaries remain. She used oral contraceptives with no complications. She also used HRT for 2 years without complications.     SOCIAL HISTORY:  Lauren Holt is now retired. 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 She is divorced. Her oldest daughter, Lauren Holt", is a 5th Land in [place], [place]. The patient's son, Lauren Holt,  works in a Proofreader in [place]. The patient's son, Lauren Holt, works in Quarry manager in [place].  The patient has 4 grandchildren. She currently does not belong to a church.     ADVANCED DIRECTIVES: Not in place.  At the 07/07/2017 visit the patient was given the appropriate documents to complete and notarized at her discretion.  HEALTH MAINTENANCE: Social History   Tobacco Use  . Smoking status: Former Smoker    Years: 1.00    Types: Cigarettes  . Smokeless tobacco: Never Used  . Tobacco comment: Smoked X 1 year at age 69 (less than ppd)  Substance Use Topics  . Alcohol use: Not Currently  . Drug use: Never     Colonoscopy: 02/2017 at [facility]  PAP:   Bone density: 2018/ osteopenia   No Known Allergies  Current Outpatient Medications  Medication Sig Dispense Refill  . ALPRAZolam (XANAX) 0.25 MG tablet Take 0.125 mg by mouth 2 (two) times daily as needed for anxiety.     Marland Kitchen anastrozole (ARIMIDEX) 1 MG tablet Take 1 tablet (1 mg total) by mouth daily. 90 tablet 4  . aspirin 325 MG tablet Take 325 mg by mouth daily.    Marland Kitchen BETA CAROTENE PO Take 7,500 mcg by mouth daily.    Marland Kitchen  calcium carbonate (CALCIUM 600) 600 MG TABS tablet Take 600 mg by mouth daily.    . Cholecalciferol (VITAMIN D-3) 5000 units TABS Take 1 tablet by mouth daily.    . ciprofloxacin (CIPRO) 500 MG tablet Take 500 mg by mouth 2 (two) times daily. for 7 days  0  . lidocaine-prilocaine (EMLA) cream Apply 1 application topically as needed. 30 g 1  . Lutein 20 MG CAPS Take 20 mg by mouth daily.     . Magnesium Citrate 100 MG TABS Take 100 mg by mouth daily.     . metroNIDAZOLE (FLAGYL) 500 MG tablet Take 500 mg by mouth every 8 (eight) hours.  0  . Multiple Vitamins-Minerals (ICAPS AREDS FORMULA PO) Take 1 capsule by mouth 3 (three) times a week.     . Probiotic Product (PROBIOTIC DAILY PO) Take 1 capsule by mouth 3 (three) times a week.     . prochlorperazine (COMPAZINE) 10 MG tablet Take 1 tablet (10 mg  total) by mouth every 6 (six) hours as needed for nausea or vomiting. 30 tablet 0  . Red Yeast Rice 600 MG CAPS Take 600 mg by mouth daily.     Marland Kitchen VITAMIN E PO Take 180 mg by mouth daily.    Marland Kitchen zinc gluconate 50 MG tablet Take 50 mg by mouth daily as needed (cold prevention).     No current facility-administered medications for this visit.     OBJECTIVE: Older white Holt in no acute distress  Vitals:   05/18/18 1505  BP: (!) 156/77  Pulse: 81  Resp: 18  Temp: 98.2 F (36.8 C)  SpO2: 100%     Body mass index is 31.99 kg/m.   Wt Readings from Last 3 Encounters:  05/18/18 180 lb 9.6 oz (81.9 kg)  03/30/18 174 lb 4.8 oz (79.1 kg)  03/02/18 178 lb 14.4 oz (81.1 kg)  ECOG FS:1 - Symptomatic but completely ambulatory  Sclerae unicteric, EOMs intact Oropharynx clear and moist No cervical or supraclavicular adenopathy Lungs no rales or rhonchi Heart regular rate and rhythm Abd soft, nontender, positive bowel sounds MSK no focal spinal tenderness, no upper extremity lymphedema Neuro: nonfocal, well oriented, appropriate affect Breasts: The right breast is status post mastectomy with expander in place.  There is no evidence of local recurrence or residual disease.  The left breast is benign.  Both axillae are benign.    LAB RESULTS:  CMP     Component Value Date/Time   NA 139 05/18/2018 1443   K 4.0 05/18/2018 1443   CL 103 05/18/2018 1443   CO2 28 05/18/2018 1443   GLUCOSE 92 05/18/2018 1443   BUN 10 05/18/2018 1443   CREATININE 0.73 05/18/2018 1443   CREATININE 0.71 12/15/2017 1049   CALCIUM 9.3 05/18/2018 1443   PROT 7.1 05/18/2018 1443   ALBUMIN 3.9 05/18/2018 1443   AST 22 05/18/2018 1443   AST 15 12/15/2017 1049   ALT 19 05/18/2018 1443   ALT 13 12/15/2017 1049   ALKPHOS 86 05/18/2018 1443   BILITOT 0.3 05/18/2018 1443   BILITOT 0.4 12/15/2017 1049   GFRNONAA >60 05/18/2018 1443   GFRNONAA >60 12/15/2017 1049   GFRAA >60 05/18/2018 1443   GFRAA >60 12/15/2017  1049    No results found for: TOTALPROTELP, ALBUMINELP, A1GS, A2GS, BETS, BETA2SER, GAMS, MSPIKE, SPEI  No results found for: KPAFRELGTCHN, LAMBDASER, KAPLAMBRATIO  Lab Results  Component Value Date   WBC 5.1 05/18/2018   NEUTROABS 3.4 05/18/2018  HGB 11.4 (L) 05/18/2018   HCT 36.1 05/18/2018   MCV 104.6 (H) 05/18/2018   PLT 245 05/18/2018    _0 @  No results found for: LABCA2  No components found for: [id number]  No results for input(s): INR in the last 168 hours.  No results found for: LABCA2  No results found for: WUJ811  No results found for: BJY782  No results found for: NFA213  No results found for: CA2729  No components found for: HGQUANT  No results found for: CEA1 / No results found for: CEA1   No results found for: AFPTUMOR  No results found for: CHROMOGRNA  No results found for: PSA1  Appointment on 05/18/2018  Component Date Value Ref Range Status  . Sodium 05/18/2018 139  135 - 145 mmol/L Final  . Potassium 05/18/2018 4.0  3.5 - 5.1 mmol/L Final  . Chloride 05/18/2018 103  98 - 111 mmol/L Final  . CO2 05/18/2018 28  22 - 32 mmol/L Final  . Glucose, Bld 05/18/2018 92  70 - 99 mg/dL Final  . BUN 05/18/2018 10  8 - 23 mg/dL Final  . Creatinine, Ser 05/18/2018 0.73  0.44 - 1.00 mg/dL Final  . Calcium 05/18/2018 9.3  8.9 - 10.3 mg/dL Final  . Total Protein 05/18/2018 7.1  6.5 - 8.1 g/dL Final  . Albumin 05/18/2018 3.9  3.5 - 5.0 g/dL Final  . AST 05/18/2018 22  15 - 41 U/L Final  . ALT 05/18/2018 19  0 - 44 U/L Final  . Alkaline Phosphatase 05/18/2018 86  38 - 126 U/L Final  . Total Bilirubin 05/18/2018 0.3  0.3 - 1.2 mg/dL Final  . GFR calc non Af Amer 05/18/2018 >60  >60 mL/min Final  . GFR calc Af Amer 05/18/2018 >60  >60 mL/min Final  . Anion gap 05/18/2018 8  5 - 15 Final   Performed at [facility], [place] [address] [place], [place] [address]  . WBC 05/18/2018 5.1  4.0 - 10.5 K/uL Final  . RBC  05/18/2018 3.45* 3.87 - 5.11 MIL/uL Final  . Hemoglobin 05/18/2018 11.4* 12.0 - 15.0 g/dL Final  . HCT 05/18/2018 36.1  36.0 - 46.0 % Final  . MCV 05/18/2018 104.6* 80.0 - 100.0 fL Final  . MCH 05/18/2018 33.0  26.0 - 34.0 pg Final  . MCHC 05/18/2018 31.6  30.0 - 36.0 g/dL Final  . RDW 05/18/2018 12.9  11.5 - 15.5 % Final  . Platelets 05/18/2018 245  150 - 400 K/uL Final  . nRBC 05/18/2018 0.0  0.0 - 0.2 % Final  . Neutrophils Relative % 05/18/2018 68  % Final  . Neutro Abs 05/18/2018 3.4  1.7 - 7.7 K/uL Final  . Lymphocytes Relative 05/18/2018 22  % Final  . Lymphs Abs 05/18/2018 1.1  0.7 - 4.0 K/uL Final  . Monocytes Relative 05/18/2018 8  % Final  . Monocytes Absolute 05/18/2018 0.4  0.1 - 1.0 K/uL Final  . Eosinophils Relative 05/18/2018 2  % Final  . Eosinophils Absolute 05/18/2018 0.1  0.0 - 0.5 K/uL Final  . Basophils Relative 05/18/2018 0  % Final  . Basophils Absolute 05/18/2018 0.0  0.0 - 0.1 K/uL Final  . Immature Granulocytes 05/18/2018 0  % Final  . Abs Immature Granulocytes 05/18/2018 0.01  0.00 - 0.07 K/uL Final   Performed at [facility], [place] [address], [place], [place] [address]    (this displays the last labs from the last 3  days)  No results found for: TOTALPROTELP, ALBUMINELP, A1GS, A2GS, BETS, BETA2SER, GAMS, MSPIKE, SPEI (this displays SPEP labs)  No results found for: KPAFRELGTCHN, LAMBDASER, KAPLAMBRATIO (kappa/lambda light chains)  No results found for: HGBA, HGBA2QUANT, HGBFQUANT, HGBSQUAN (Hemoglobinopathy evaluation)   No results found for: LDH  No results found for: IRON, TIBC, IRONPCTSAT (Iron and TIBC)  No results found for: FERRITIN  Urinalysis    Component Value Date/Time   COLORURINE STRAW (A) 04/12/2018 [place] 04/12/2018 1426   LABSPEC 1.003 (L) 04/12/2018 1426   PHURINE 6.0 04/12/2018 1426   GLUCOSEU NEGATIVE 04/12/2018 1426   HGBUR NEGATIVE 04/12/2018 1426   BILIRUBINUR NEGATIVE  04/12/2018 [place] 04/12/2018 1426   PROTEINUR NEGATIVE 04/12/2018 1426   NITRITE NEGATIVE 04/12/2018 [place] 04/12/2018 1426     STUDIES: 2 for repeat mammography in March.  ELIGIBLE FOR AVAILABLE RESEARCH PROTOCOL: exact sciences study  ASSESSMENT: 78 y.o. Lauren Holt, Lauren Holt status post right breast biopsy 07/01/2017 for a clinical mT2 N0, stage IB invasive ductal carcinoma, grade 1, estrogen and progesterone receptor positive, HER-2 nonamplified, with an MIB-105%  (1) a second area in the right breast biopsied 07/01/2017 showed ductal carcinoma in situ, grade 3, estrogen and progesterone receptor positive.  (2) status post right mastectomy and sentinel lymph node sampling 09/24/2017 for a pT1b pN0, stage IA invasive ductal carcinoma, grade 1, with negative margins  (a) a total of 5 sentinel lymph nodes were removed  (3) The Oncotype DX score was 28, predicting a risk of outside the breast recurrence over the next 9 years of 17% if the patient's only systemic therapy is tamoxifen for 5 years.  It also predicts a significant benefit from chemotherapy.  (4) cyclophosphamide, methotrexate and fluorouracil (CMF) chemotherapy started 11/17/2017, repeated every 21 days x 8, last treatment 04/12/2018  (a) Udenyca added with cycle 4 and subsequent cycles due to neutropenia  (5) anastrozole started 08/04/2016, interrupted during chemotherapy  (a) bone density NT 19 at Dr. Virgilio Belling  (6) genetics testing 07/22/2017 through the Common Hereditary Cancer Panel offered by [facility] found no deleterious mutations in APC, ATM, AXIN2, BARD1, BMPR1A, BRCA1, BRCA2, BRIP1, CDH1, CDKN2A (p14ARF), CDKN2A (p16INK4a), CKD4, CHEK2, CTNNA1, DICER1, EPCAM (Deletion/duplication testing only), GREM1 (promoter region deletion/duplication testing only), KIT, MEN1, MLH1, MSH2, MSH3, MSH6, MUTYH, NBN, NF1, NHTL1, PALB2, PDGFRA, PMS2, POLD1, POLE, PTEN, RAD50, RAD51C, RAD51D,  SDHB, SDHC, SDHD, SMAD4, SMARCA4. STK11, TP53, TSC1, TSC2, and VHL.  The following genes were evaluated for sequence changes only: SDHA and HOXB13 c.251G>A variant only.   (a) a variant of uncertain significance in the gene DICER1 was identified.  c.2116+6G>A (Intronic)  (7) CT A/P done on 02/11/2018 for LLQ pain, no cause for pain identified, but two indeterminate liver lesions were noted  (a) MRI liver scheduled for late March (after implant exchange).   PLAN: Lauren Holt completed her adjuvant chemotherapy without losing her hair, with no significant nausea, and with minimal fatigue.  Overall she did quite well.  She is now ready to resume anastrozole.  She tolerated it well previously and hopefully she will do equally well now that she is starting it longer-term.  She tells me she had a bone density at Dr. Virgilio Belling last year and we will try to obtain those results.  He has minimal lymphedema of the right hand, which is very intermittent, and only with very increased activity and hot weather.  I have asked 1 of our physical therapists to  drop in and show her how to do her own massage.   Lauren Holt does have a sleeve and glove available.  She had very likely benign but in any case indeterminate liver lesions noted on earlier scans.  We have not been able to obtain an MRI because of her expander.  She will have that removed 07/01/2018 and I have set her up for liver MRI approximately 10 days after that.  She will then return to see me in April.  Assuming all is well I will start seeing her on a once a year basis at that time.  Knows to call for any other problems that may develop before the next visit.  Virgie Dad. Caron Ode, MD  05/18/18 3:35 PM Medical Oncology and Hematology [facility] [address] [place], [place] [address] Tel. [phone]    Fax. [phone]  I, Wilburn Mylar, am acting as scribe for Dr. Virgie Dad. Lauren Holt.  I, Lurline Del MD, have reviewed  the above documentation for accuracy and completeness, and I agree with the above.

## 2018-05-23 ENCOUNTER — Ambulatory Visit: Payer: Medicare HMO | Admitting: Rehabilitation

## 2018-05-23 ENCOUNTER — Ambulatory Visit: Payer: Self-pay | Admitting: Rehabilitation

## 2018-06-16 ENCOUNTER — Ambulatory Visit
Admission: RE | Admit: 2018-06-16 | Discharge: 2018-06-16 | Disposition: A | Discharge status: Home / Self Care | Payer: Medicare HMO | Source: Ambulatory Visit | Attending: Oncology | Admitting: Oncology

## 2018-06-16 DIAGNOSIS — Z170 Estrogen receptor positive status [ER+]: Principal | ICD-10-CM

## 2018-06-16 DIAGNOSIS — Z9011 Acquired absence of right breast and nipple: Secondary | ICD-10-CM | POA: Diagnosis not present

## 2018-06-16 DIAGNOSIS — C50311 Malignant neoplasm of lower-inner quadrant of right female breast: Secondary | ICD-10-CM

## 2018-06-16 DIAGNOSIS — Z853 Personal history of malignant neoplasm of breast: Secondary | ICD-10-CM | POA: Diagnosis not present

## 2018-06-16 DIAGNOSIS — Z1231 Encounter for screening mammogram for malignant neoplasm of breast: Secondary | ICD-10-CM | POA: Diagnosis not present

## 2018-06-17 NOTE — H&P (Signed)
Subjective:     Patient ID: Lauren Holt is a 77 y.o. female.  HPI  9 months post op. Plan implant exchange and opposite breast surgery this month. Returned from [place] recently visiting her 95 yo mother.   Presented following screening MMG withcalcificationsrightbreast. Diagnostic MMG showed calcifications within the inferior right breast, spanning 10 cm, more superior component spanning 4 cm extent. The more superior component may have a small associated mass.US axilla normal. Two biopsies initially done- first labeled right breastlower central, more lateral superior: IDC, ER/PR +, Her2-. In the lower central, more medial inferior: DCIS with calcifications, ER/PR+. Third biopsy labeled right lower outer with DCIS ER/PR+.  Final pathology 1.2 cm IDC with DCIS margins clear 0/5 SLN.  Oncotype 28/17- Completed adjuvant chemotherapy. Genetics negative. Anastrazole resumed.  Prior 42 D, desires smaller. Right mastectomy 1188 g Screening MMG 06/2018 over left 3.5.20 benign.  Lives with adult son who works nights. 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  PMH significant for OSA.    Objective:   Physical Exam  Cardiovascular: Normal rate, regular rhythm and normal heart sounds.  Pulmonary/Chest: Effort normal and breath sounds normal.   Chest:  Right chest expanded soft, no visible rippling SN to nipple L 33, grade 3 ptosis BW R 14 L 22 Nipple to IMF L 12 cm Left chest port    Assessment:     Right breast cancer LIQ ER+ DCIS S/p R SRM, prepectoral TE/ADM reconstruction Adjuvant chemotherapy    Plan:     Had CT ab/pelvis 02/2018 two foci liver lesions, Oncology ordered MRI liver but this cannot be done given presence expander. Plan for this following implant exchange.  Plan removal TE and placement permanent implant, removal left chest port, left breast reduction.  Plan left breast reduction- reviewed anchor type scars, drain.  Reviewed asymmetry unilateral implant reconstruction with opposite breast- implant reconstruction will always be asymmetric with right and goal would be symmetric volume and cleavage in bra. Diminished sensation nipple and breast skin, risk of nipple loss, wound healing problems, asymmetry, incidental carcinoma, changes with wt gain/loss, aging, unacceptable cosmetic appearance reviewed.  Plan right implant exchange. Reviewed saline vs silicone. As in prepectoral position I do recommend HCG or capacity filled silicone implants to reduce risk visible rippling. Reviewedultimate volume with implant will be larger than present but overall will be limited by CW size with regards to final implant selection.Reviewed MRI or US surveillance for rupture with silicone implants. We specifically discussed HCG implants- reviewed appearance implants if she experiences AP flip and that if this occurred would be very noticeable with HCG. Reviewed purpose texturing, risk ALCL with this.  Plan   Additional risks seroma, hematoma, anesthesia, DVT/PE, cardiopulmonary complications, damage to deeper structures reviewed.  Rx for Cipro given for use now as reports symptoms UTI. Rx for Tramadol and Robaxin completed for post op use. Plan OP surgery.  Natrelle [id number]-13-T 500 ml tissue expander placed,  fill volume 500 ml saline.   , MD MBA Plastic & Reconstructive Surgery [phone], pin [phone]   

## 2018-06-22 ENCOUNTER — Encounter (HOSPITAL_BASED_OUTPATIENT_CLINIC_OR_DEPARTMENT_OTHER): Payer: Self-pay

## 2018-06-24 ENCOUNTER — Encounter (HOSPITAL_BASED_OUTPATIENT_CLINIC_OR_DEPARTMENT_OTHER): Payer: Self-pay | Admitting: *Deleted

## 2018-06-24 ENCOUNTER — Other Ambulatory Visit: Payer: Self-pay

## 2018-06-24 NOTE — Progress Notes (Signed)
Patient states she returned from [place] 06-13-18 after visiting her 76 yr old mother. She states she has not had any fever, cough or congestion since returning to [place]. She is scheduled for breast reconstruction 07-01-18.

## 2018-06-24 NOTE — Progress Notes (Signed)
Ensure pre surgical drink given to pt with instructions to finish by 0415. hibiclens soap given to pt with instructions. Pt verbalized understanding

## 2018-08-01 ENCOUNTER — Telehealth: Payer: Self-pay | Admitting: Oncology

## 2018-08-01 NOTE — Telephone Encounter (Signed)
Called patient regarding [facility] appointment, left patient a voicemail and sent a [employer]

## 2018-08-02 NOTE — Progress Notes (Signed)
[facility]  Telephone:(336) [phone] Fax:(336) [phone]     ID: Mlissa Tamayo DOB: 1941/01/08  MR#: [id number]  ZDG#:[id number]  Patient Care Team: Alroy Dust, Carlean Jews.Marlou Sa, MD as PCP - General (Family Medicine) Jovita Kussmaul, MD as Consulting Physician (General Surgery) Jessalynn Mccowan, Virgie Dad, MD as Consulting Physician (Oncology) Kyung Rudd, MD as Consulting Physician (Radiation Oncology) OTHER MD:   CHIEF COMPLAINT: Estrogen receptor positive breast cancer  CURRENT TREATMENT: anastrozole   I connected with Vinie Sill on 08/03/18 at  2:30 PM EDT by telephone visit and verified that I am speaking with the correct person using two identifiers.   I discussed the limitations, risks, security and privacy concerns of performing an evaluation and management service by telemedicine and the availability of in-person appointments. I also discussed with the patient that there may be a patient responsible charge related to this service. The patient expressed understanding and agreed to proceed.   Other persons participating in the visit and their role in the encounter:   - Biomedical scientist, Medical Scribe   Patients location: home  Providers location: [place]   Chief Complaint: Estrogen receptor positive breast cancer    HISTORY OF CURRENT ILLNESS: On the original intake note:  Lauren Holt had routine screening mammography on 06/14/2017 showing a possible area of calcifications in the right breast. She underwent unilateral right diagnostic mammography with tomography at [facility] on 06/23/2017 showing: Suspicious calcifications within the inferior right breast, spanning nearly 10 cm, more superior component spanning 4 cm extent. The more superior component may have a small associated mass. Ultrasonography of the right axilla on 07/01/2017 showed normal right axillary lymph nodes.   Accordingly on 06/29/2017 she proceeded to biopsy of the right  breast area in question. The pathology from this procedure showed ([id number]): In the right breast lower central, more lateral superior: invasive ductal carcinoma grade I. In the lower central, more medial inferior: Ductal carcinoma in situ, high grade, with calcifications. The  invasive tumor was significant for estrogen receptor, 100% positive and progesterone receptor, 10% positive, both with strong staining intensity. Proliferation marker Ki67 at 5%. HER2  not amplified. The ductal carcinoma in situ was significant for estrogen receptor, 100% positive and progesterone receptor, 80% positive, both with strong staining intensity.   The patient's subsequent history is as detailed below.   INTERVAL HISTORY: Lauren Holt was seen today for follow-up and treatment of her estrogen receptor positive breast cancer.   She continues on anastrozole. She seems to tolerate this well, but she does not some mild changes with her memory. She has some hot flashes as night, but these are not unchanged from the past. She has some vaginal dryness as well, which she treats with olive oil.   Since her last visit here, she underwent a digital screening unilateral left mammogram with tomography on 06/16/2018 showing: Breast Density Category B. There is no mammographic evidence of malignancy.   She had been scheduled for implant exchange in March, but that had to be postponed because of the current pandemic. She is comfortable with this decision.   Similarly we will not be able to do an MRI of the liver as we have planned until she has the expander removed.  We do believe the lesion seen previously are benign but this does need to be followed.   REVIEW OF SYSTEMS: Dashanae notes a rash on her scalp that has "begun to creep down her face." She has started to have some breast swelling.  The patient denies unusual headaches, visual changes, nausea, vomiting, or dizziness. There has been no unusual cough, phlegm production, or  pleurisy. This been no change in bowel or bladder habits. The patient denies unexplained fatigue or unexplained weight loss, bleeding, rash, or fever. A detailed review of systems was otherwise noncontributory.    PAST MEDICAL HISTORY: Past Medical History:  Diagnosis Date   Anxiety    Breast cancer, right breast ([facility])    Dermatitis    Family history of adverse reaction to anesthesia    "I think my sister was hard to wake up" (09/24/2017)   Family history of breast cancer 07/12/2017   Family history of prostate cancer 07/12/2017   Genetic testing    Palpitations    when she lays down, has not seen anyone for this, feels like a flutter   HLD ( but not too high)   PAST SURGICAL HISTORY: Past Surgical History:  Procedure Laterality Date   BREAST BIOPSY Right 06/2017   BREAST RECONSTRUCTION WITH PLACEMENT OF TISSUE EXPANDER AND ALLODERM Right 09/24/2017   BREAST RECONSTRUCTION WITH PLACEMENT OF TISSUE EXPANDER AND ALLODERM Right 09/24/2017   Procedure: BREAST RECONSTRUCTION WITH PLACEMENT OF TISSUE EXPANDER AND ALLODERM;  Surgeon: Irene Limbo, MD;  Location: [facility];  Service: Plastics;  Laterality: Right;   CARPAL TUNNEL RELEASE Bilateral    COLONOSCOPY     MASTECTOMY COMPLETE / SIMPLE W/ SENTINEL NODE BIOPSY Right 09/24/2017   MASTECTOMY W/ SENTINEL NODE BIOPSY Right 09/24/2017   Procedure: MASTECTOMY WITH SENTINEL LYMPH NODE BIOPSY;  Surgeon: Jovita Kussmaul, MD;  Location: [facility];  Service: General;  Laterality: Right;   PORTACATH PLACEMENT Left 11/08/2017   Procedure: INSERTION PORT-A-CATH;  Surgeon: Jovita Kussmaul, MD;  Location: [facility];  Service: General;  Laterality: Left;   TUBAL LIGATION     VAGINAL HYSTERECTOMY     Partial Hysterectomy without BSO. Because they were too close to the bladder.    FAMILY HISTORY Family History  Problem Relation Age of Onset   Breast cancer Paternal Aunt    Breast cancer Cousin 66   Stroke Mother        caused blindness     Prostate cancer Father 7   Breast cancer Cousin 37   Breast cancer Cousin 25   Breast cancer Cousin 35   Breast cancer Other    The patient's father died at age 42 due to emphysema. The patient's mother is alive at age 8, turning 96 March 2020. The patient has 5 brothers and 3 sisters. There was a paternal great aunt with breast cancer. There was also a paternal cousin with breast cancer.    GYNECOLOGIC HISTORY:  No LMP recorded. Patient has had a hysterectomy. Menarche: 78 years old Age at first live birth: 78 years old The patient is GXP3. She is s/p partial hysterectomy. Both of her ovaries remain. She used oral contraceptives with no complications. She also used HRT for 2 years without complications.     SOCIAL HISTORY:  Spenser is now retired. 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 She is divorced. Her oldest daughter, [name]", is a 5th Land in [place], [place]. The patient's son, [name], works in a Proofreader in [place]. The patient's son, [name], works in Quarry manager in [place].  The patient has 4 grandchildren. She currently does not belong to a church.   ADVANCED DIRECTIVES: Not in place.  At the 07/07/2017 visit  the patient was given the appropriate documents to complete and notarized at her discretion.   HEALTH MAINTENANCE: Social History   Tobacco Use   Smoking status: Former Smoker    Years: 1.00    Types: Cigarettes   Smokeless tobacco: Never Used   Tobacco comment: Smoked X 1 year at age 89 (less than ppd)  Substance Use Topics   Alcohol use: Yes    Comment: social   Drug use: Never     Colonoscopy: 02/2017 at [facility]  PAP:   Bone density: 2018/ osteopenia   No Known Allergies  Current Outpatient Medications  Medication Sig Dispense Refill   ALPRAZolam (XANAX) 0.25 MG tablet Take 0.125 mg by mouth 2 (two) times daily as needed for anxiety.      anastrozole (ARIMIDEX)  1 MG tablet Take 1 tablet (1 mg total) by mouth daily. 90 tablet 4   BETA CAROTENE PO Take 7,500 mcg by mouth daily.     calcium carbonate (CALCIUM 600) 600 MG TABS tablet Take 600 mg by mouth daily.     Cholecalciferol (VITAMIN D-3) 5000 units TABS Take 1 tablet by mouth daily.     Lutein 20 MG CAPS Take 20 mg by mouth daily.      Multiple Vitamins-Minerals (ICAPS AREDS FORMULA PO) Take 1 capsule by mouth 3 (three) times a week.      Probiotic Product (PROBIOTIC DAILY PO) Take 1 capsule by mouth 3 (three) times a week.      Red Yeast Rice 600 MG CAPS Take 600 mg by mouth daily.      VITAMIN E PO Take 180 mg by mouth daily.     zinc gluconate 50 MG tablet Take 50 mg by mouth daily as needed (cold prevention).     No current facility-administered medications for this visit.     OBJECTIVE: Older white woman   There were no vitals filed for this visit.   There is no height or weight on file to calculate BMI.   Wt Readings from Last 3 Encounters:  05/18/18 180 lb 9.6 oz (81.9 kg)  03/30/18 174 lb 4.8 oz (79.1 kg)  03/02/18 178 lb 14.4 oz (81.1 kg)  ECOG FS:1 - Symptomatic but completely ambulatory     LAB RESULTS:  CMP     Component Value Date/Time   NA 139 05/18/2018 1443   K 4.0 05/18/2018 1443   CL 103 05/18/2018 1443   CO2 28 05/18/2018 1443   GLUCOSE 92 05/18/2018 1443   BUN 10 05/18/2018 1443   CREATININE 0.73 05/18/2018 1443   CREATININE 0.71 12/15/2017 1049   CALCIUM 9.3 05/18/2018 1443   PROT 7.1 05/18/2018 1443   ALBUMIN 3.9 05/18/2018 1443   AST 22 05/18/2018 1443   AST 15 12/15/2017 1049   ALT 19 05/18/2018 1443   ALT 13 12/15/2017 1049   ALKPHOS 86 05/18/2018 1443   BILITOT 0.3 05/18/2018 1443   BILITOT 0.4 12/15/2017 1049   GFRNONAA >60 05/18/2018 1443   GFRNONAA >60 12/15/2017 1049   GFRAA >60 05/18/2018 1443   GFRAA >60 12/15/2017 1049    No results found for: TOTALPROTELP, ALBUMINELP, A1GS, A2GS, BETS, BETA2SER, GAMS, MSPIKE, SPEI  No  results found for: KPAFRELGTCHN, LAMBDASER, KAPLAMBRATIO  Lab Results  Component Value Date   WBC 5.1 05/18/2018   NEUTROABS 3.4 05/18/2018   HGB 11.4 (L) 05/18/2018   HCT 36.1 05/18/2018   MCV 104.6 (H) 05/18/2018   PLT 245 05/18/2018    _0 @  No results found for: LABCA2  No components found for: RDEYCX448  No results for input(s): INR in the last 168 hours.  No results found for: LABCA2  No results found for: JEH631  No results found for: SHF026  No results found for: VZC588  No results found for: CA2729  No components found for: HGQUANT  No results found for: CEA1 / No results found for: CEA1   No results found for: AFPTUMOR  No results found for: CHROMOGRNA  No results found for: PSA1  No visits with results within 3 Day(s) from this visit.  Latest known visit with results is:  Appointment on 05/18/2018  Component Date Value Ref Range Status   Sodium 05/18/2018 139  135 - 145 mmol/L Final   Potassium 05/18/2018 4.0  3.5 - 5.1 mmol/L Final   Chloride 05/18/2018 103  98 - 111 mmol/L Final   CO2 05/18/2018 28  22 - 32 mmol/L Final   Glucose, Bld 05/18/2018 92  70 - 99 mg/dL Final   BUN 05/18/2018 10  8 - 23 mg/dL Final   Creatinine, Ser 05/18/2018 0.73  0.44 - 1.00 mg/dL Final   Calcium 05/18/2018 9.3  8.9 - 10.3 mg/dL Final   Total Protein 05/18/2018 7.1  6.5 - 8.1 g/dL Final   Albumin 05/18/2018 3.9  3.5 - 5.0 g/dL Final   AST 05/18/2018 22  15 - 41 U/L Final   ALT 05/18/2018 19  0 - 44 U/L Final   Alkaline Phosphatase 05/18/2018 86  38 - 126 U/L Final   Total Bilirubin 05/18/2018 0.3  0.3 - 1.2 mg/dL Final   GFR calc non Af Amer 05/18/2018 >60  >60 mL/min Final   GFR calc Af Amer 05/18/2018 >60  >60 mL/min Final   Anion gap 05/18/2018 8  5 - 15 Final   Performed at [facility], [place] [address], [place], [place] [address]   WBC 05/18/2018 5.1  4.0 - 10.5 K/uL Final   RBC 05/18/2018 3.45* 3.87 -  5.11 MIL/uL Final   Hemoglobin 05/18/2018 11.4* 12.0 - 15.0 g/dL Final   HCT 05/18/2018 36.1  36.0 - 46.0 % Final   MCV 05/18/2018 104.6* 80.0 - 100.0 fL Final   MCH 05/18/2018 33.0  26.0 - 34.0 pg Final   MCHC 05/18/2018 31.6  30.0 - 36.0 g/dL Final   RDW 05/18/2018 12.9  11.5 - 15.5 % Final   Platelets 05/18/2018 245  150 - 400 K/uL Final   nRBC 05/18/2018 0.0  0.0 - 0.2 % Final   Neutrophils Relative % 05/18/2018 68  % Final   Neutro Abs 05/18/2018 3.4  1.7 - 7.7 K/uL Final   Lymphocytes Relative 05/18/2018 22  % Final   Lymphs Abs 05/18/2018 1.1  0.7 - 4.0 K/uL Final   Monocytes Relative 05/18/2018 8  % Final   Monocytes Absolute 05/18/2018 0.4  0.1 - 1.0 K/uL Final   Eosinophils Relative 05/18/2018 2  % Final   Eosinophils Absolute 05/18/2018 0.1  0.0 - 0.5 K/uL Final   Basophils Relative 05/18/2018 0  % Final   Basophils Absolute 05/18/2018 0.0  0.0 - 0.1 K/uL Final   Immature Granulocytes 05/18/2018 0  % Final   Abs Immature Granulocytes 05/18/2018 0.01  0.00 - 0.07 K/uL Final   Performed at [facility], [place] [address] [place], [place] [address]    (this displays the last labs from the last 3 days)  No results found for: TOTALPROTELP,  ALBUMINELP, A1GS, A2GS, BETS, BETA2SER, GAMS, MSPIKE, SPEI (this displays SPEP labs)  No results found for: KPAFRELGTCHN, LAMBDASER, KAPLAMBRATIO (kappa/lambda light chains)  No results found for: HGBA, HGBA2QUANT, HGBFQUANT, HGBSQUAN (Hemoglobinopathy evaluation)   No results found for: LDH  No results found for: IRON, TIBC, IRONPCTSAT (Iron and TIBC)  No results found for: FERRITIN  Urinalysis    Component Value Date/Time   COLORURINE STRAW (A) 04/12/2018 1426   APPEARANCEUR CLEAR 04/12/2018 1426   LABSPEC 1.003 (L) 04/12/2018 1426   PHURINE 6.0 04/12/2018 1426   GLUCOSEU NEGATIVE 04/12/2018 1426   HGBUR NEGATIVE 04/12/2018 1426   BILIRUBINUR NEGATIVE 04/12/2018 [place] 04/12/2018 1426   PROTEINUR NEGATIVE 04/12/2018 1426   NITRITE NEGATIVE 04/12/2018 1426   LEUKOCYTESUR NEGATIVE 04/12/2018 1426     STUDIES: No results found.    ELIGIBLE FOR AVAILABLE RESEARCH PROTOCOL: exact sciences study   ASSESSMENT: 78 y.o. Lauren Holt, [place] woman status post right breast biopsy 07/01/2017 for a clinical mT2 N0, stage IB invasive ductal carcinoma, grade 1, estrogen and progesterone receptor positive, HER-2 nonamplified, with an MIB-105%  (1) a second area in the right breast biopsied 07/01/2017 showed ductal carcinoma in situ, grade 3, estrogen and progesterone receptor positive.  (2) status post right mastectomy and sentinel lymph node sampling 09/24/2017 for a pT1b pN0, stage IA invasive ductal carcinoma, grade 1, with negative margins  (a) a total of 5 sentinel lymph nodes were removed  (3) The Oncotype DX score was 28, predicting a risk of outside the breast recurrence over the next 9 years of 17% if the patient's only systemic therapy is tamoxifen for 5 years.  It also predicts a significant benefit from chemotherapy.  (4) cyclophosphamide, methotrexate and fluorouracil (CMF) chemotherapy started 11/17/2017, repeated every 21 days x 8, last treatment 04/12/2018  (a) Udenyca added with cycle 4 and subsequent cycles due to neutropenia  (5) anastrozole started 08/04/2016, interrupted during chemotherapy, resumed FEB 2020  (a) bone density 12/07/2016 at Dr Virgilio Belling showed a T score of - 1.5 to -2.0  (6) genetics testing 07/22/2017 through the Common Hereditary Cancer Panel offered by [facility] found no deleterious mutations in APC, ATM, AXIN2, BARD1, BMPR1A, BRCA1, BRCA2, BRIP1, CDH1, CDKN2A (p14ARF), CDKN2A (p16INK4a), CKD4, CHEK2, CTNNA1, DICER1, EPCAM (Deletion/duplication testing only), GREM1 (promoter region deletion/duplication testing only), KIT, MEN1, MLH1, MSH2, MSH3, MSH6, MUTYH, NBN, NF1, NHTL1, PALB2, PDGFRA, PMS2, POLD1, POLE, PTEN, RAD50,  RAD51C, RAD51D, SDHB, SDHC, SDHD, SMAD4, SMARCA4. STK11, TP53, TSC1, TSC2, and VHL.  The following genes were evaluated for sequence changes only: SDHA and HOXB13 c.251G>A variant only.   (a) a variant of uncertain significance in the gene DICER1 was identified.  c.2116+6G>A (Intronic)  (7) CT A/P done on 02/11/2018 for LLQ pain, no cause for pain identified, but two indeterminate liver lesions were noted  (a) MRI liver scheduled for 09/13/2018   PLAN: Korri is back on anastrozole and generally tolerating that well.  The plan will be to continue that a total of 5 years.  She is tolerating the hot flashes well vaginal dryness could use some work.  Right now we cannot refer her to our pelvic rehab program but she is using emollients successfully.  She requested something for dermatitis but I really do think it would be better for her to see a dermatologist for a more formal evaluation  She thinks her breasts are swelling a little.  This may simply be weight change.  On the other hand I would have  to bring her in for exam which neither she nor I want to do at this point.  If the problem persists or she develops redness she will let me know  Otherwise she will see me again in October.  Hopefully she will have had a reconstruction done by then and she can have her liver MRI and a DEXA scan which has already been scheduled.  She knows to call for any other issue that may develop before the next visit.     Virgie Dad. Bartosz Luginbill, MD  08/03/18 3:05 PM Medical Oncology and Hematology [facility] [address] [place], [place] [address] Tel. [phone]    Fax. [phone]  I, Jacqualyn Posey am acting as a Education administrator for Chauncey Cruel, MD.   I, Lurline Del MD, have reviewed the above documentation for accuracy and completeness, and I agree with the above.

## 2018-08-03 ENCOUNTER — Other Ambulatory Visit: Payer: Medicare HMO

## 2018-08-03 ENCOUNTER — Inpatient Hospital Stay: Payer: Medicare HMO | Attending: Oncology | Admitting: Oncology

## 2018-08-03 DIAGNOSIS — C50311 Malignant neoplasm of lower-inner quadrant of right female breast: Secondary | ICD-10-CM

## 2018-08-03 DIAGNOSIS — Z170 Estrogen receptor positive status [ER+]: Secondary | ICD-10-CM | POA: Diagnosis not present

## 2018-08-03 DIAGNOSIS — Z79811 Long term (current) use of aromatase inhibitors: Secondary | ICD-10-CM

## 2018-08-03 DIAGNOSIS — Z9221 Personal history of antineoplastic chemotherapy: Secondary | ICD-10-CM

## 2018-08-04 ENCOUNTER — Telehealth: Payer: Self-pay | Admitting: Oncology

## 2018-08-04 NOTE — Telephone Encounter (Signed)
Called regarding schedule °

## 2018-08-16 ENCOUNTER — Ambulatory Visit (HOSPITAL_BASED_OUTPATIENT_CLINIC_OR_DEPARTMENT_OTHER): Admission: RE | Admit: 2018-08-16 | Payer: Medicare HMO | Source: Home / Self Care | Admitting: Plastic Surgery

## 2018-08-16 SURGERY — REMOVAL, TISSUE EXPANDER, BREAST, WITH IMPLANT INSERTION
Anesthesia: General | Site: Chest | Laterality: Right

## 2018-08-19 ENCOUNTER — Telehealth: Payer: Self-pay | Admitting: *Deleted

## 2018-08-19 NOTE — Telephone Encounter (Signed)
This RN received VM from pt stating " I think I am having side effects from this Anastrazole "  " I am very fatigued, my body aches all over,my feet hurt, and I am having memory loss "  Pt did not leave a return number.  This RN returned call per number on demographics - obtained VM. Message left requesting pt to call on Monday. Advised if symptoms worsen to call primary MD or on call MD for concerns.

## 2018-09-27 ENCOUNTER — Ambulatory Visit (HOSPITAL_BASED_OUTPATIENT_CLINIC_OR_DEPARTMENT_OTHER): Admit: 2018-09-27 | Payer: Medicare HMO | Admitting: Plastic Surgery

## 2018-09-27 ENCOUNTER — Encounter (HOSPITAL_BASED_OUTPATIENT_CLINIC_OR_DEPARTMENT_OTHER): Payer: Self-pay

## 2018-09-27 SURGERY — REMOVAL, TISSUE EXPANDER, BREAST, WITH IMPLANT INSERTION
Anesthesia: General | Site: Chest | Laterality: Right

## 2018-09-30 ENCOUNTER — Telehealth: Payer: Self-pay | Admitting: Adult Health

## 2018-09-30 NOTE — Telephone Encounter (Signed)
I talk with patient regarding visit  °

## 2018-10-19 ENCOUNTER — Telehealth: Payer: Self-pay | Admitting: Adult Health

## 2018-10-19 NOTE — Telephone Encounter (Signed)
Left message for patient to verify telephone visit for pre reg °

## 2018-10-20 ENCOUNTER — Encounter: Payer: Self-pay | Admitting: Adult Health

## 2018-10-20 ENCOUNTER — Inpatient Hospital Stay: Payer: Medicare HMO | Attending: Oncology | Admitting: Adult Health

## 2018-10-20 DIAGNOSIS — Z170 Estrogen receptor positive status [ER+]: Secondary | ICD-10-CM | POA: Diagnosis not present

## 2018-10-20 DIAGNOSIS — C50311 Malignant neoplasm of lower-inner quadrant of right female breast: Secondary | ICD-10-CM | POA: Diagnosis not present

## 2018-10-20 DIAGNOSIS — Z79811 Long term (current) use of aromatase inhibitors: Secondary | ICD-10-CM | POA: Diagnosis not present

## 2018-10-20 DIAGNOSIS — Z923 Personal history of irradiation: Secondary | ICD-10-CM | POA: Diagnosis not present

## 2018-10-20 MED ORDER — LETROZOLE 2.5 MG PO TABS: 2.5000 mg | ORAL_TABLET | Freq: Every day | ORAL | 0 refills | Status: DC

## 2018-10-20 NOTE — Progress Notes (Signed)
SURVIVORSHIP VIRTUAL VISIT:  I connected with Lauren Holt on 10/20/18 at  2:00 PM EDT by telephone and verified that I am speaking with the correct person using two identifiers.   I discussed the limitations, risks, security and privacy concerns of performing an evaluation and management service by telephone and the availability of in person appointments. I also discussed with the patient that there may be a patient responsible charge related to this service. The patient expressed understanding and agreed to proceed.   BRIEF ONCOLOGIC HISTORY:  Oncology History  Malignant neoplasm of lower-inner quadrant of right breast of female, estrogen receptor positive ([place])  06/29/2017 Initial Diagnosis    status post right breast biopsy for a clinical mT2 N0, stage IB invasive ductal carcinoma, grade 1, estrogen and progesterone receptor positive, HER-2 nonamplified, with an MIB-5%   -- a second area in the right breast biopsied 06/29/2017 showed ductal carcinoma in situ, grade 3, estrogen and progesterone receptor positive.           07/22/2017 Genetic Testing   genetics testing through the Common Hereditary Cancer Panel offered by [facility] found no deleterious mutations in APC, ATM, AXIN2, BARD1, BMPR1A, BRCA1, BRCA2, BRIP1, CDH1, CDKN2A (p14ARF), CDKN2A (p16INK4a), CKD4, CHEK2, CTNNA1, DICER1, EPCAM (Deletion/duplication testing only), GREM1 (promoter region deletion/duplication testing only), KIT, MEN1, MLH1, MSH2, MSH3, MSH6, MUTYH, NBN, NF1, NHTL1, PALB2, PDGFRA, PMS2, POLD1, POLE, PTEN, RAD50, RAD51C, RAD51D, SDHB, SDHC, SDHD, SMAD4, SMARCA4. STK11, TP53, TSC1, TSC2, and VHL.  The following genes were evaluated for sequence changes only: SDHA and HOXB13 c.251G>A variant only.              (a) a variant of uncertain significance in the gene DICER1 was identified.  c.2116+6G>A ([facility])   09/24/2017 Surgery   status post right mastectomy and sentinel lymph node sampling 09/24/2017 for a pT1b  pN0, stage IA invasive ductal carcinoma, grade 1, with negative margins             (a) a total of 5 sentinel lymph nodes were removed   09/24/2017 Oncotype testing   The Oncotype DX score was 28, predicting a risk of outside the breast recurrence over the next 9 years of 17% if the patient's only systemic therapy is tamoxifen for 5 years.  It also predicts a significant benefit from chemotherapy.   11/17/2017 - 04/12/2018 Adjuvant Chemotherapy    cyclophosphamide, methotrexate and fluorouracil (CMF) chemotherapy started 11/17/2017, repeated every 21 days x 8, last treatment 04/12/2018             (a) Udenyca added with cycle 4 and subsequent cycles due to neutropenia   02/11/2018 Miscellaneous    CT A/P done on 02/11/2018 for LLQ pain, no cause for pain identified, but two indeterminate liver lesions were noted             (a) MRI liver scheduled for 09/13/2018   05/2018 -  Anti-estrogen oral therapy   anastrozole started 08/04/2016, interrupted during chemotherapy, resumed FEB 2020             (a) bone density 12/07/2016 at Dr Virgilio Belling showed a T score of - 1.5 to -2.0     INTERVAL HISTORY:  Lauren Holt to review her survivorship care plan detailing her treatment course for breast cancer, as well as monitoring long-term side effects of that treatment, education regarding health maintenance, screening, and overall wellness and health promotion.     Overall, Lauren Holt reports feeling quite well.  Lauren Holt notes that she  stopped taking Anastrozole.  She noted that they were causing increased arthralgias and fatigue.  She stopped these about a week after her last visit with Dr. Jana Hakim.    Lauren Holt notes an increase in bloating.  She also notes a bowel movement every time she eats.  She has undergone colonoscopy.  She notes that she previously took probiotics and now is out of these.  Lauren Holt takes Xanax, 1/2 pill at night to sleep.  She combines this with benadryl.  She notes that she is  almost out of both and is requesting a refill.   Lauren Holt has an upcoming appointment with her PCP, Dr. Alroy Dust on 11/17/2018.  Lauren Holt [place] neighborhood with a pool and she enjoys swimming laps and also walks frequently.  She notes that she tries to eat healthy, and is incorporating more fish into her diet.    REVIEW OF SYSTEMS:  Review of Systems  Constitutional: Negative for appetite change, chills, fatigue, fever and unexpected weight change.  HENT:   Negative for hearing loss, lump/mass and mouth sores.   Eyes: Negative for eye problems and icterus.  Respiratory: Negative for chest tightness, cough and shortness of breath.   Cardiovascular: Negative for chest pain, leg swelling and palpitations.  Gastrointestinal: Negative for abdominal distention, abdominal pain, constipation, diarrhea, nausea and vomiting.  Endocrine: Negative for hot flashes.  Musculoskeletal: Negative for arthralgias.  Skin: Negative for itching and rash.  Neurological: Negative for dizziness, extremity weakness, headaches and numbness.  Hematological: Negative for adenopathy. Does not bruise/bleed easily.  Psychiatric/Behavioral: Negative for depression. The patient is not nervous/anxious.         ONCOLOGY TREATMENT TEAM:  1. Surgeon:  Dr. Marlou Starks at [facility] 2. Medical Oncologist: Dr. Jana Hakim  3. Radiation Oncologist: Dr. Lisbeth Renshaw    PAST MEDICAL/SURGICAL HISTORY:  Past Medical History:  Diagnosis Date  . Anxiety   . Breast cancer, right breast ([place])   . Dermatitis   . Family history of adverse reaction to anesthesia    "I think my sister was hard to wake up" (09/24/2017)  . Family history of breast cancer 07/12/2017  . Family history of prostate cancer 07/12/2017  . Genetic testing   . Palpitations    when she lays down, has not seen anyone for this, feels like a flutter   Past Surgical History:  Procedure Laterality Date  . BREAST BIOPSY Right 06/2017  . BREAST RECONSTRUCTION WITH  PLACEMENT OF TISSUE EXPANDER AND ALLODERM Right 09/24/2017  . BREAST RECONSTRUCTION WITH PLACEMENT OF TISSUE EXPANDER AND ALLODERM Right 09/24/2017   Procedure: BREAST RECONSTRUCTION WITH PLACEMENT OF TISSUE EXPANDER AND ALLODERM;  Surgeon: Irene Limbo, MD;  Location: [facility];  Service: Plastics;  Laterality: Right;  . CARPAL TUNNEL RELEASE Bilateral   . COLONOSCOPY    . MASTECTOMY COMPLETE / SIMPLE W/ SENTINEL NODE BIOPSY Right 09/24/2017  . MASTECTOMY W/ SENTINEL NODE BIOPSY Right 09/24/2017   Procedure: MASTECTOMY WITH SENTINEL LYMPH NODE BIOPSY;  Surgeon: Jovita Kussmaul, MD;  Location: [facility];  Service: General;  Laterality: Right;  . PORTACATH PLACEMENT Left 11/08/2017   Procedure: INSERTION PORT-A-CATH;  Surgeon: Jovita Kussmaul, MD;  Location: [facility];  Service: General;  Laterality: Left;  . TUBAL LIGATION    . VAGINAL HYSTERECTOMY       ALLERGIES:  No Known Allergies   CURRENT MEDICATIONS:  Outpatient Encounter Medications as of 10/20/2018  Medication Sig  . ALPRAZolam (XANAX) 0.25 MG tablet Take 0.125 mg by mouth 2 (  two) times daily as needed for anxiety.   Marland Kitchen anastrozole (ARIMIDEX) 1 MG tablet Take 1 tablet (1 mg total) by mouth daily.  Marland Kitchen BETA CAROTENE PO Take 7,500 mcg by mouth daily.  . calcium carbonate (CALCIUM 600) 600 MG TABS tablet Take 600 mg by mouth daily.  . Cholecalciferol (VITAMIN D-3) 5000 units TABS Take 1 tablet by mouth daily.  . Lutein 20 MG CAPS Take 20 mg by mouth daily.   . Multiple Vitamins-Minerals (ICAPS AREDS FORMULA PO) Take 1 capsule by mouth 3 (three) times a week.   . Probiotic Product (PROBIOTIC DAILY PO) Take 1 capsule by mouth 3 (three) times a week.   . Red Yeast Rice 600 MG CAPS Take 600 mg by mouth daily.   Marland Kitchen VITAMIN E PO Take 180 mg by mouth daily.  Marland Kitchen zinc gluconate 50 MG tablet Take 50 mg by mouth daily as needed (cold prevention).   No facility-administered encounter medications on file as of 10/20/2018.      ONCOLOGIC FAMILY HISTORY:   Family History  Problem Relation Age of Onset  . Breast cancer Paternal Aunt   . Breast cancer Cousin 64  . Stroke Mother        caused blindness  . Prostate cancer Father 58  . Breast cancer Cousin 51  . Breast cancer Cousin 25  . Breast cancer Cousin 16  . Breast cancer Other      GENETIC COUNSELING/TESTING: negative  SOCIAL HISTORY:  Social History   Socioeconomic History  . Marital status: Divorced    Spouse name: Not on file  . Number of children: Not on file  . Years of education: Not on file  . Highest education level: Not on file  Occupational History  . Not on file  Social Needs  . Financial resource strain: Not on file  . Food insecurity    Worry: Not on file    Inability: Not on file  . Transportation needs    Medical: Not on file    Non-medical: Not on file  Tobacco Use  . Smoking status: Former Smoker    Years: 1.00    Types: Cigarettes  . Smokeless tobacco: Never Used  . Tobacco comment: Smoked X 1 year at age 31 (less than ppd)  Substance and Sexual Activity  . Alcohol use: Yes    Comment: social  . Drug use: Never  . Sexual activity: Not on file  Lifestyle  . Physical activity    Days per week: Not on file    Minutes per session: Not on file  . Stress: Not on file  Relationships  . Social Herbalist on phone: Not on file    Gets together: Not on file    Attends religious service: Not on file    Active member of club or organization: Not on file    Attends meetings of clubs or organizations: Not on file    Relationship status: Not on file  . Intimate partner violence    Fear of current or ex partner: Not on file    Emotionally abused: Not on file    Physically abused: Not on file    Forced sexual activity: Not on file  Other Topics Concern  . Not on file  Social History Narrative  . Not on file     OBSERVATIONS/OBJECTIVE:  Patient sounds well.  Breathing is non labored.  Mood and behavior normal.  No apparent  distress.  LABORATORY DATA:  None for this visit.  DIAGNOSTIC IMAGING:  None for this visit.      ASSESSMENT AND PLAN:  Ms.. Gathers is a pleasant 78 y.o. female with Stage IA right breast invasive ductal carcinoma, ER+/PR+/HER2-, diagnosed in 06/2017, treated with mastectomy, adjuvant chemotherapy, and anti-estrogen therapy with anastrozole that she has had difficulty tolerating.  She presents to the Survivorship Clinic for our initial meeting and routine follow-up post-completion of treatment for breast cancer.    1. Stage IA right breast cancer:  Ms. Hockman is continuing to recover from definitive treatment for breast cancer. She will follow-up with her medical oncologist, Dr. Jana Hakim in 01/2019 with history and physical exam per surveillance protocol.  She had stopped taking anti estrogen therapy with Anastrozole due to fatigue and joint aches.  I reviewed other anti estrogen therapies with her in detail, along with her risk of not taking anti estrogen therapy.  After hearing risks and benefits of the treatments, she has opted to take Letrozole daily and I sent this in to [facility] for her to take.    Today, a comprehensive survivorship care plan and treatment summary was reviewed with the patient today detailing her breast cancer diagnosis, treatment course, potential late/long-term effects of treatment, appropriate follow-up care with recommendations for the future, and patient education resources.  A copy of this summary, along with a letter will be sent to the patient's primary care provider via mail/fax/In Basket message after today's visit.    2. Bone health:  Given Ms. Viera's age/history of breast cancer and her current treatment regimen including anti-estrogen therapy with aromatase inhibitors, she is at risk for bone demineralization.  She has her bone density checked regularly with Dr. Alroy Dust.  She was given education on specific activities to promote bone health.  3.   Sleep pattern disturbance: I recommended she call Dr. Virgilio Belling office for a refill since he is her primary prescriber of this medication.    4. Cancer screening:  Due to Ms. Selman's history and her age, she should receive screening for skin cancers, colon cancer, and gynecologic cancers.  The information and recommendations are listed on the patient's comprehensive care plan/treatment summary and were reviewed in detail with the patient.    5. Health maintenance and wellness promotion: Ms. Knouff was encouraged to consume 5-7 servings of fruits and vegetables per day. We reviewed the "Nutrition Rainbow" handout, as well as the handout "Take Control of Your Health and Reduce Your Cancer Risk" from the [place].  She was also encouraged to engage in moderate to vigorous exercise for 30 minutes per day most days of the week. We discussed the [employer] fitness program, which is designed for cancer survivors to help them become more physically fit after cancer treatments.  She was instructed to limit her alcohol consumption and continue to abstain from tobacco use.     6. Support services/counseling: It is not uncommon for this period of the patient's cancer care trajectory to be one of many emotions and stressors.  We discussed how this can be increasingly difficult during the times of quarantine and social distancing due to the COVID-19 pandemic.   She was given information regarding our available services and encouraged to contact me with any questions or for help enrolling in any of our support group/programs.    Follow up instructions:    -Return to cancer center in 01/2019 for f/u with Dr. Jana Hakim  -Mammogram due in 06/2019 -She is welcome to return back to  the Survivorship Clinic at any time; no additional follow-up needed at this time.  -Consider referral back to survivorship as a long-term survivor for continued surveillance  The patient was provided an opportunity  to ask questions and all were answered. The patient agreed with the plan and demonstrated an understanding of the instructions.   The patient was advised to call back or seek an in-person evaluation if the symptoms worsen or if the condition fails to improve as anticipated.   I provided 35 minutes of non face-to-face telephone visit time during this encounter, and > 50% was spent counseling as documented under my assessment & plan.  Scot Dock, NP

## 2018-11-06 IMAGING — DX DG CHEST 1V PORT
1 series · 1 of 1 positions shown · non-contrast
Comparison: None.

CLINICAL DATA: Initial evaluation for Port-A-Cath.

EXAM:
PORTABLE CHEST 1 VIEW

[chest ap]
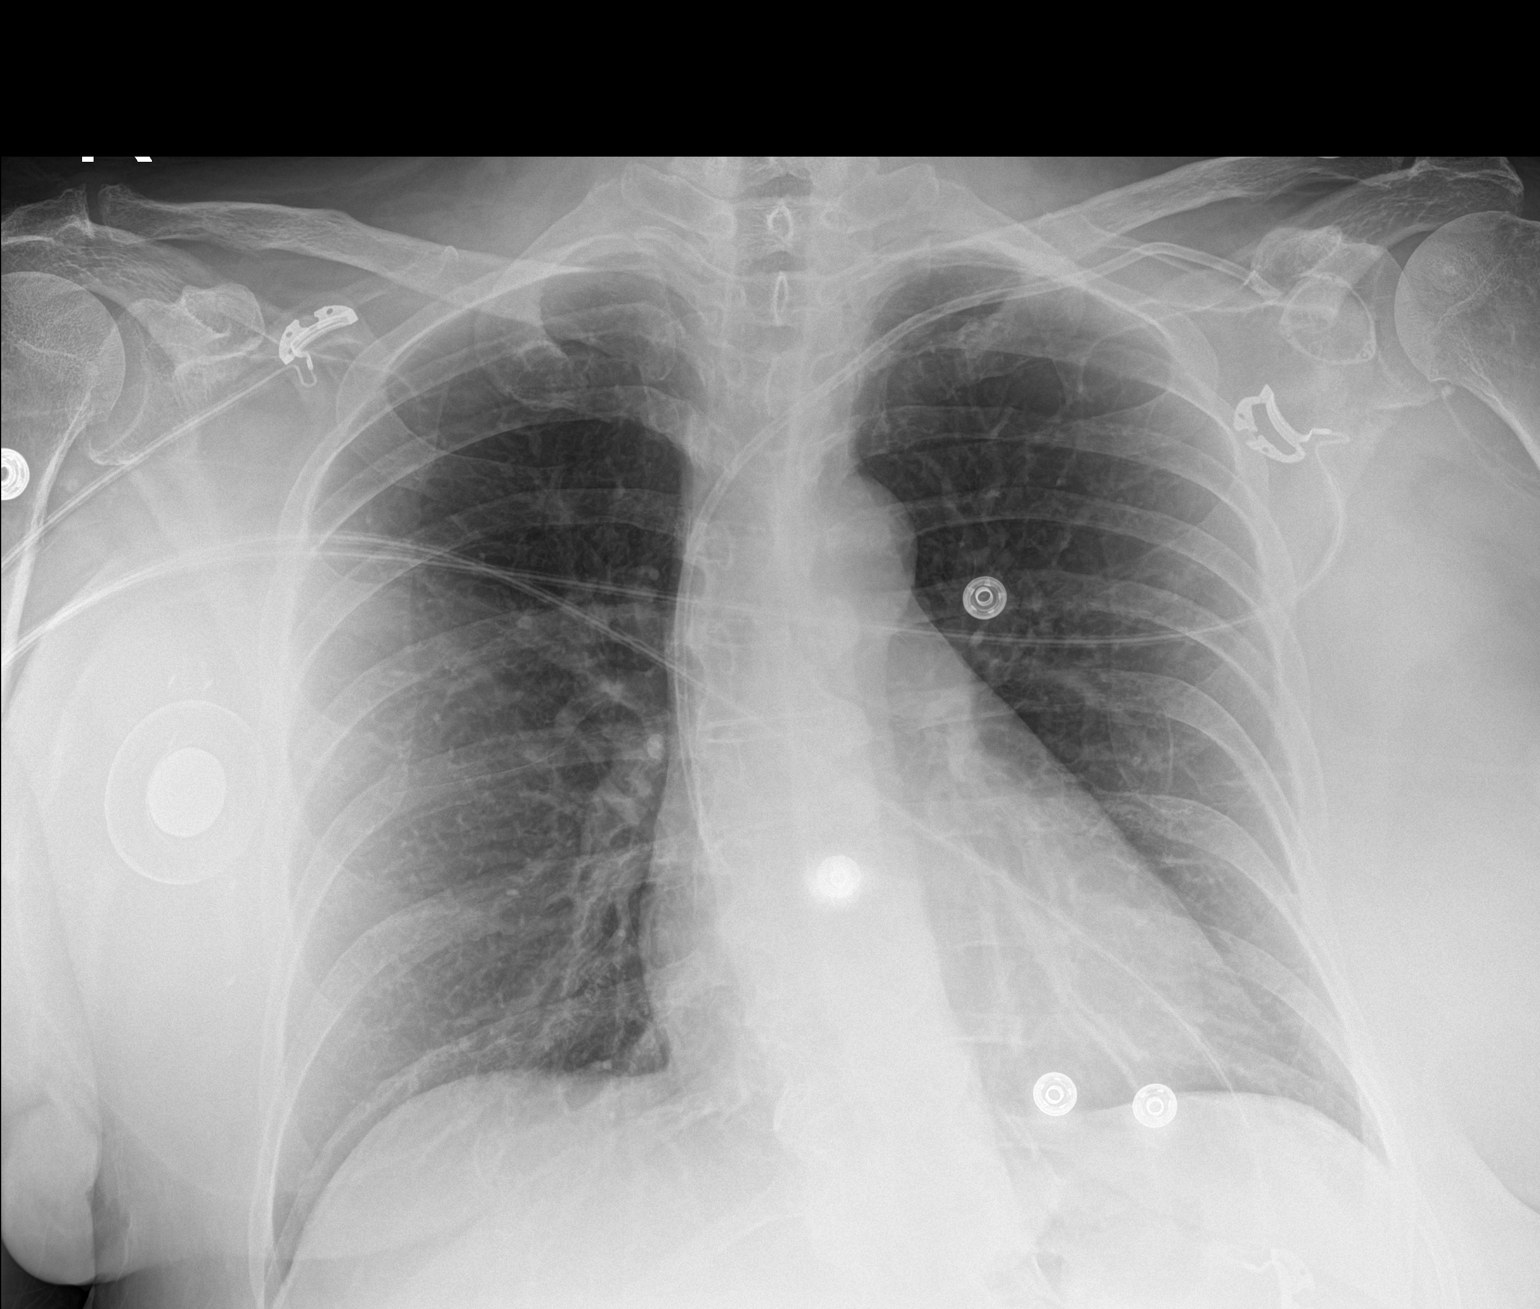

[1 of 1 positions shown; findings below may reference images not displayed]

FINDINGS: Left-sided subclavian approach Port-A-Cath in place with tip
overlying the cavoatrial junction. Cardiac and mediastinal
silhouettes within normal limits.

Lungs normally inflated. No focal infiltrates. No pulmonary edema or
pleural effusion. No pneumothorax.

No acute osseous abnormality. Right breast tissue expander in place.
Mild scoliosis noted.
IMPRESSION: 1. Left subclavian approach central venous catheter in place with
tip overlying the cavoatrial junction.
2. No other active cardiopulmonary disease.

## 2018-11-30 DIAGNOSIS — M25572 Pain in left ankle and joints of left foot: Secondary | ICD-10-CM | POA: Diagnosis not present

## 2018-11-30 DIAGNOSIS — M25551 Pain in right hip: Secondary | ICD-10-CM | POA: Diagnosis not present

## 2018-11-30 DIAGNOSIS — M542 Cervicalgia: Secondary | ICD-10-CM | POA: Diagnosis not present

## 2018-11-30 DIAGNOSIS — M25511 Pain in right shoulder: Secondary | ICD-10-CM | POA: Diagnosis not present

## 2018-11-30 DIAGNOSIS — M545 Low back pain: Secondary | ICD-10-CM | POA: Diagnosis not present

## 2018-11-30 DIAGNOSIS — M791 Myalgia, unspecified site: Secondary | ICD-10-CM | POA: Diagnosis not present

## 2018-12-05 DIAGNOSIS — M79672 Pain in left foot: Secondary | ICD-10-CM | POA: Diagnosis not present

## 2018-12-05 DIAGNOSIS — M25551 Pain in right hip: Secondary | ICD-10-CM | POA: Diagnosis not present

## 2018-12-08 DIAGNOSIS — M25511 Pain in right shoulder: Secondary | ICD-10-CM | POA: Diagnosis not present

## 2018-12-08 DIAGNOSIS — M542 Cervicalgia: Secondary | ICD-10-CM | POA: Diagnosis not present

## 2018-12-08 DIAGNOSIS — M545 Low back pain: Secondary | ICD-10-CM | POA: Diagnosis not present

## 2018-12-08 DIAGNOSIS — M791 Myalgia, unspecified site: Secondary | ICD-10-CM | POA: Diagnosis not present

## 2018-12-08 DIAGNOSIS — M25551 Pain in right hip: Secondary | ICD-10-CM | POA: Diagnosis not present

## 2018-12-08 DIAGNOSIS — M25572 Pain in left ankle and joints of left foot: Secondary | ICD-10-CM | POA: Diagnosis not present

## 2018-12-12 DIAGNOSIS — M25511 Pain in right shoulder: Secondary | ICD-10-CM | POA: Diagnosis not present

## 2018-12-12 DIAGNOSIS — M791 Myalgia, unspecified site: Secondary | ICD-10-CM | POA: Diagnosis not present

## 2018-12-12 DIAGNOSIS — M545 Low back pain: Secondary | ICD-10-CM | POA: Diagnosis not present

## 2018-12-12 DIAGNOSIS — M25551 Pain in right hip: Secondary | ICD-10-CM | POA: Diagnosis not present

## 2018-12-13 DIAGNOSIS — M25511 Pain in right shoulder: Secondary | ICD-10-CM | POA: Diagnosis not present

## 2018-12-13 DIAGNOSIS — H5203 Hypermetropia, bilateral: Secondary | ICD-10-CM | POA: Diagnosis not present

## 2018-12-13 DIAGNOSIS — M791 Myalgia, unspecified site: Secondary | ICD-10-CM | POA: Diagnosis not present

## 2018-12-13 DIAGNOSIS — H524 Presbyopia: Secondary | ICD-10-CM | POA: Diagnosis not present

## 2018-12-13 DIAGNOSIS — H52223 Regular astigmatism, bilateral: Secondary | ICD-10-CM | POA: Diagnosis not present

## 2018-12-13 DIAGNOSIS — M25551 Pain in right hip: Secondary | ICD-10-CM | POA: Diagnosis not present

## 2018-12-13 DIAGNOSIS — H25813 Combined forms of age-related cataract, bilateral: Secondary | ICD-10-CM | POA: Diagnosis not present

## 2018-12-13 DIAGNOSIS — M545 Low back pain: Secondary | ICD-10-CM | POA: Diagnosis not present

## 2018-12-14 DIAGNOSIS — Z9011 Acquired absence of right breast and nipple: Secondary | ICD-10-CM | POA: Diagnosis not present

## 2018-12-14 DIAGNOSIS — Z853 Personal history of malignant neoplasm of breast: Secondary | ICD-10-CM | POA: Diagnosis not present

## 2018-12-14 NOTE — H&P (Signed)
Subjective:     Patient ID: Lauren Holt is a 78 y.o. female.  HPI  15 months post op. Plan implant exchange and opposite breast surgery this month- this was delayed secondary to COVID.   Presented following screening MMG withcalcificationsrightbreast. Diagnostic MMG showed calcifications within the inferior right breast, spanning 10 cm, more superior component spanning 4 cm extent. The more superior component may have a small associated mass.[place] axilla normal. Two biopsies initially done- first labeled right breastlower central, more lateral superior: IDC, ER/PR +, Her2-. In the lower central, more medial inferior: DCIS with calcifications, ER/PR+. Third biopsy labeled right lower outer with DCIS ER/PR+.  Final pathology 1.2 cm IDC with DCIS margins clear 0/5 SLN.  Oncotype 28/17- Completed adjuvant chemotherapy. Genetics negative. Anastrazole resumed.  Prior 50 D, desires smaller. Right mastectomy 1188 g MMG 06/2018 normal  Lives with adult son who works nights. 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  PMH significant for OSA.     Objective:   Physical Exam  Cardiovascular: Normal rate, regular rhythm and normal heart sounds.  Pulmonary/Chest: Effort normal and breath sounds normal.   Chest:  Right chest expanded soft, no visible rippling, step off/depression superior pole SN to nipple L 33 cm, grade 3 ptosis BW R 14 L 22 Nipple to IMF L 12 cm Left chest port    Assessment:     Right breast cancer LIQ ER+ DCIS S/p R SRM, prepectoral TE/ADM reconstruction Adjuvant chemotherapy    Plan:     Had CT ab/pelvis 02/2018 two foci liver lesions, Oncology ordered MRI liver but this cannot be done given presence expander. Plan for this following implant exchange.  Plan removal TE and placement silicone implant, removal left chest port, left breast reduction.  Plan left breast reduction- reviewed anchor type scars, drain. Reviewed asymmetry  unilateral implant reconstruction with opposite breast- implant reconstruction will always be asymmetric with right and goal would be symmetric volume and cleavage in bra. Diminished sensation nipple and breast skin, risk of nipple loss, wound healing problems, asymmetry, incidental carcinoma, changes with wt gain/loss, aging, unacceptable cosmetic appearance reviewed.  Plan right implant exchange. Reviewed saline vs silicone. As in prepectoral position I do recommend HCG or capacity filled silicone implants to reduce risk visible rippling. Reviewedultimate volume with implant will be larger than present but overall will be limited by CW size with regards to final implant selection.Reviewed MRI or [place] surveillance for rupture with silicone implants. We specifically discussed HCG implants- reviewed appearance implants if she experiences AP flip and that if this occurred would be very noticeable with HCG. Reviewed purpose texturing, risk ALCL with this.  Plan smooth round capacity filled silicone.  Discussed risk COVID infectionthrough this elective surgery. Patient will receive COVID testing prior to surgery. Discussed even if patient receivesa negative test result, the tests in some cases may fail to detect the virus or patient maycontract COVID after the test.COVID 19 infectionbefore/during/aftersurgery may result in lead to a higher chance of complication and death.  Additional risks seroma, hematoma, anesthesia, DVT/PE, cardiopulmonary complications, damage to deeper structures reviewed.  Rx for Tramadol and Robaxin given in March 2020 preop visit- she still has this. Plan OP surgery.  Natrelle [id number]-13-T 500 ml tissue expander placed,  fill volume 500 ml saline.   Irene Limbo, MD [facility] [phone], pin [phone]

## 2018-12-16 DIAGNOSIS — M25551 Pain in right hip: Secondary | ICD-10-CM | POA: Diagnosis not present

## 2018-12-16 DIAGNOSIS — M25511 Pain in right shoulder: Secondary | ICD-10-CM | POA: Diagnosis not present

## 2018-12-16 DIAGNOSIS — M545 Low back pain: Secondary | ICD-10-CM | POA: Diagnosis not present

## 2018-12-16 DIAGNOSIS — M791 Myalgia, unspecified site: Secondary | ICD-10-CM | POA: Diagnosis not present

## 2018-12-20 DIAGNOSIS — R194 Change in bowel habit: Secondary | ICD-10-CM | POA: Diagnosis not present

## 2018-12-20 DIAGNOSIS — M8580 Other specified disorders of bone density and structure, unspecified site: Secondary | ICD-10-CM | POA: Diagnosis not present

## 2018-12-20 DIAGNOSIS — L719 Rosacea, unspecified: Secondary | ICD-10-CM | POA: Diagnosis not present

## 2018-12-20 DIAGNOSIS — Z0000 Encounter for general adult medical examination without abnormal findings: Secondary | ICD-10-CM | POA: Diagnosis not present

## 2018-12-20 DIAGNOSIS — E7800 Pure hypercholesterolemia, unspecified: Secondary | ICD-10-CM | POA: Diagnosis not present

## 2018-12-20 DIAGNOSIS — F419 Anxiety disorder, unspecified: Secondary | ICD-10-CM | POA: Diagnosis not present

## 2018-12-27 DIAGNOSIS — Z23 Encounter for immunization: Secondary | ICD-10-CM | POA: Diagnosis not present

## 2018-12-27 DIAGNOSIS — Z853 Personal history of malignant neoplasm of breast: Secondary | ICD-10-CM | POA: Diagnosis not present

## 2018-12-27 DIAGNOSIS — E7800 Pure hypercholesterolemia, unspecified: Secondary | ICD-10-CM | POA: Diagnosis not present

## 2018-12-29 ENCOUNTER — Other Ambulatory Visit: Payer: Self-pay

## 2018-12-29 ENCOUNTER — Encounter (HOSPITAL_BASED_OUTPATIENT_CLINIC_OR_DEPARTMENT_OTHER): Payer: Self-pay | Admitting: *Deleted

## 2019-01-03 ENCOUNTER — Other Ambulatory Visit (HOSPITAL_COMMUNITY)
Admission: RE | Admit: 2019-01-03 | Discharge: 2019-01-03 | Disposition: A | Discharge status: Home / Self Care | Payer: Medicare HMO | Source: Ambulatory Visit | Attending: Plastic Surgery | Admitting: Plastic Surgery

## 2019-01-03 DIAGNOSIS — Z20828 Contact with and (suspected) exposure to other viral communicable diseases: Secondary | ICD-10-CM | POA: Insufficient documentation

## 2019-01-03 DIAGNOSIS — Z01812 Encounter for preprocedural laboratory examination: Secondary | ICD-10-CM | POA: Insufficient documentation

## 2019-01-04 LAB — NOVEL CORONAVIRUS, NAA (HOSP ORDER, SEND-OUT TO REF LAB; TAT 18-24 HRS): SARS-CoV-2, NAA: NOT DETECTED

## 2019-01-05 NOTE — Anesthesia Preprocedure Evaluation (Addendum)
Anesthesia Evaluation  Patient identified by MRN, date of birth, ID band Patient awake    Reviewed: Allergy & Precautions, NPO status , Patient's Chart, lab work & pertinent test results  History of Anesthesia Complications Negative for: history of anesthetic complications  Airway Mallampati: II  TM Distance: >3 FB Neck ROM: Full    Dental  (+) Caps, Dental Advisory Given   Pulmonary neg pulmonary ROS, former smoker,  01/03/2019 SARS coronavirus NEG   breath sounds clear to auscultation       Cardiovascular (-) anginanegative cardio ROS   Rhythm:Regular Rate:Normal     Neuro/Psych  Headaches, Anxiety Depression    GI/Hepatic negative GI ROS, Neg liver ROS,   Endo/Other  negative endocrine ROS  Renal/GU negative Renal ROS     Musculoskeletal  (+) Fibromyalgia -  Abdominal   Peds  Hematology negative hematology ROS (+)   Anesthesia Other Findings   Reproductive/Obstetrics                            Anesthesia Physical Anesthesia Plan  ASA: III  Anesthesia Plan: General   Post-op Pain Management:    Induction: Intravenous  PONV Risk Score and Plan: 3 and Ondansetron, Dexamethasone and Treatment may vary due to age or medical condition  Airway Management Planned: Oral ETT  Additional Equipment:   Intra-op Plan:   Post-operative Plan: Extubation in OR  Informed Consent: I have reviewed the patients History and Physical, chart, labs and discussed the procedure including the risks, benefits and alternatives for the proposed anesthesia with the patient or authorized representative who has indicated his/her understanding and acceptance.     Dental advisory given  Plan Discussed with: CRNA and Surgeon  Anesthesia Plan Comments:       Anesthesia Quick Evaluation

## 2019-01-06 ENCOUNTER — Ambulatory Visit (HOSPITAL_BASED_OUTPATIENT_CLINIC_OR_DEPARTMENT_OTHER): Payer: Medicare HMO | Admitting: Certified Registered"

## 2019-01-06 ENCOUNTER — Ambulatory Visit (HOSPITAL_BASED_OUTPATIENT_CLINIC_OR_DEPARTMENT_OTHER)
Admission: RE | Admit: 2019-01-06 | Discharge: 2019-01-06 | Disposition: A | Discharge status: Home / Self Care | Payer: Medicare HMO | Attending: Plastic Surgery | Admitting: Plastic Surgery

## 2019-01-06 ENCOUNTER — Other Ambulatory Visit: Payer: Self-pay

## 2019-01-06 ENCOUNTER — Encounter (HOSPITAL_BASED_OUTPATIENT_CLINIC_OR_DEPARTMENT_OTHER)
Admission: RE | Disposition: A | Discharge status: Home / Self Care | Payer: Self-pay | Source: Home / Self Care | Attending: Plastic Surgery

## 2019-01-06 ENCOUNTER — Encounter (HOSPITAL_BASED_OUTPATIENT_CLINIC_OR_DEPARTMENT_OTHER): Payer: Self-pay | Admitting: Certified Registered"

## 2019-01-06 DIAGNOSIS — N6022 Fibroadenosis of left breast: Secondary | ICD-10-CM | POA: Diagnosis not present

## 2019-01-06 DIAGNOSIS — F419 Anxiety disorder, unspecified: Secondary | ICD-10-CM | POA: Insufficient documentation

## 2019-01-06 DIAGNOSIS — Z421 Encounter for breast reconstruction following mastectomy: Secondary | ICD-10-CM | POA: Diagnosis not present

## 2019-01-06 DIAGNOSIS — M797 Fibromyalgia: Secondary | ICD-10-CM | POA: Insufficient documentation

## 2019-01-06 DIAGNOSIS — N6489 Other specified disorders of breast: Secondary | ICD-10-CM | POA: Diagnosis not present

## 2019-01-06 DIAGNOSIS — Z853 Personal history of malignant neoplasm of breast: Secondary | ICD-10-CM | POA: Diagnosis not present

## 2019-01-06 DIAGNOSIS — Z9011 Acquired absence of right breast and nipple: Secondary | ICD-10-CM | POA: Insufficient documentation

## 2019-01-06 DIAGNOSIS — N62 Hypertrophy of breast: Secondary | ICD-10-CM | POA: Insufficient documentation

## 2019-01-06 DIAGNOSIS — Z9221 Personal history of antineoplastic chemotherapy: Secondary | ICD-10-CM | POA: Insufficient documentation

## 2019-01-06 DIAGNOSIS — G4733 Obstructive sleep apnea (adult) (pediatric): Secondary | ICD-10-CM | POA: Diagnosis not present

## 2019-01-06 DIAGNOSIS — Z87891 Personal history of nicotine dependence: Secondary | ICD-10-CM | POA: Insufficient documentation

## 2019-01-06 DIAGNOSIS — Z45811 Encounter for adjustment or removal of right breast implant: Secondary | ICD-10-CM | POA: Insufficient documentation

## 2019-01-06 DIAGNOSIS — N651 Disproportion of reconstructed breast: Secondary | ICD-10-CM | POA: Diagnosis not present

## 2019-01-06 DIAGNOSIS — Z1159 Encounter for screening for other viral diseases: Secondary | ICD-10-CM | POA: Diagnosis not present

## 2019-01-06 DIAGNOSIS — F329 Major depressive disorder, single episode, unspecified: Secondary | ICD-10-CM | POA: Insufficient documentation

## 2019-01-06 DIAGNOSIS — N6012 Diffuse cystic mastopathy of left breast: Secondary | ICD-10-CM | POA: Diagnosis not present

## 2019-01-06 DIAGNOSIS — Z483 Aftercare following surgery for neoplasm: Secondary | ICD-10-CM | POA: Diagnosis not present

## 2019-01-06 HISTORY — PX: PORT-A-CATH REMOVAL: SHX5289

## 2019-01-06 HISTORY — PX: BREAST REDUCTION SURGERY: SHX8

## 2019-01-06 HISTORY — PX: REMOVAL OF TISSUE EXPANDER AND PLACEMENT OF IMPLANT: SHX6457

## 2019-01-06 SURGERY — REMOVAL, TISSUE EXPANDER, BREAST, WITH IMPLANT INSERTION
Anesthesia: General | Site: Chest | Laterality: Right

## 2019-01-06 MED ORDER — SODIUM CHLORIDE 0.9 % IV SOLN
INTRAVENOUS | Status: DC | PRN
  Administered 2019-01-06: 09:00:00 1000 mL

## 2019-01-06 MED ORDER — DEXAMETHASONE SODIUM PHOSPHATE 4 MG/ML IJ SOLN
INTRAMUSCULAR | Status: DC | PRN
  Administered 2019-01-06: 10 mg via INTRAVENOUS

## 2019-01-06 MED ORDER — ACETAMINOPHEN 500 MG PO TABS: 1000.0000 mg | ORAL_TABLET | Freq: Once | ORAL | Status: DC

## 2019-01-06 MED ORDER — ONDANSETRON HCL 4 MG/2ML IJ SOLN
INTRAMUSCULAR | Status: AC
  Filled 2019-01-06: qty 8

## 2019-01-06 MED ORDER — ROCURONIUM BROMIDE 100 MG/10ML IV SOLN
INTRAVENOUS | Status: DC | PRN
  Administered 2019-01-06: 60 mg via INTRAVENOUS

## 2019-01-06 MED ORDER — HYDROMORPHONE HCL 1 MG/ML IJ SOLN
0.2500 mg | INTRAMUSCULAR | Status: DC | PRN
  Administered 2019-01-06 (×2): 0.5 mg via INTRAVENOUS

## 2019-01-06 MED ORDER — ACETAMINOPHEN 500 MG PO TABS
1000.0000 mg | ORAL_TABLET | ORAL | Status: AC
  Administered 2019-01-06: 07:00:00 1000 mg via ORAL

## 2019-01-06 MED ORDER — PROPOFOL 500 MG/50ML IV EMUL
INTRAVENOUS | Status: DC | PRN
  Administered 2019-01-06: 25 ug/kg/min via INTRAVENOUS

## 2019-01-06 MED ORDER — GABAPENTIN 300 MG PO CAPS
300.0000 mg | ORAL_CAPSULE | ORAL | Status: AC
  Administered 2019-01-06: 300 mg via ORAL

## 2019-01-06 MED ORDER — ONDANSETRON HCL 4 MG/2ML IJ SOLN
INTRAMUSCULAR | Status: DC | PRN
  Administered 2019-01-06: 4 mg via INTRAVENOUS

## 2019-01-06 MED ORDER — MIDAZOLAM HCL 2 MG/2ML IJ SOLN: 0.5000 mg | Freq: Once | INTRAMUSCULAR | Status: DC | PRN

## 2019-01-06 MED ORDER — PROMETHAZINE HCL 25 MG/ML IJ SOLN: 6.2500 mg | INTRAMUSCULAR | Status: DC | PRN

## 2019-01-06 MED ORDER — LIDOCAINE HCL (CARDIAC) PF 100 MG/5ML IV SOSY
PREFILLED_SYRINGE | INTRAVENOUS | Status: DC | PRN
  Administered 2019-01-06: 60 mg via INTRAVENOUS

## 2019-01-06 MED ORDER — CHLORHEXIDINE GLUCONATE CLOTH 2 % EX PADS: 6.0000 | MEDICATED_PAD | Freq: Once | CUTANEOUS | Status: DC

## 2019-01-06 MED ORDER — EPHEDRINE 5 MG/ML INJ
INTRAVENOUS | Status: AC
  Filled 2019-01-06: qty 10

## 2019-01-06 MED ORDER — HYDROMORPHONE HCL 1 MG/ML IJ SOLN
INTRAMUSCULAR | Status: AC
  Filled 2019-01-06: qty 0.5

## 2019-01-06 MED ORDER — MEPERIDINE HCL 25 MG/ML IJ SOLN: 6.2500 mg | INTRAMUSCULAR | Status: DC | PRN

## 2019-01-06 MED ORDER — BUPIVACAINE-EPINEPHRINE 0.5% -1:200000 IJ SOLN
INTRAMUSCULAR | Status: DC | PRN
  Administered 2019-01-06: 30 mL

## 2019-01-06 MED ORDER — FENTANYL CITRATE (PF) 100 MCG/2ML IJ SOLN
INTRAMUSCULAR | Status: AC
  Filled 2019-01-06: qty 2

## 2019-01-06 MED ORDER — 0.9 % SODIUM CHLORIDE (POUR BTL) OPTIME
TOPICAL | Status: DC | PRN
  Administered 2019-01-06: 09:00:00 1000 mL

## 2019-01-06 MED ORDER — MIDAZOLAM HCL 2 MG/2ML IJ SOLN: 1.0000 mg | INTRAMUSCULAR | Status: DC | PRN

## 2019-01-06 MED ORDER — EPHEDRINE SULFATE 50 MG/ML IJ SOLN
INTRAMUSCULAR | Status: DC | PRN
  Administered 2019-01-06 (×2): 10 mg via INTRAVENOUS

## 2019-01-06 MED ORDER — CEFAZOLIN SODIUM-DEXTROSE 2-4 GM/100ML-% IV SOLN
2.0000 g | INTRAVENOUS | Status: AC
  Administered 2019-01-06: 2 g via INTRAVENOUS

## 2019-01-06 MED ORDER — LIDOCAINE 2% (20 MG/ML) 5 ML SYRINGE
INTRAMUSCULAR | Status: AC
  Filled 2019-01-06: qty 10

## 2019-01-06 MED ORDER — PROPOFOL 10 MG/ML IV BOLUS
INTRAVENOUS | Status: DC | PRN
  Administered 2019-01-06: 200 mg via INTRAVENOUS

## 2019-01-06 MED ORDER — CEFAZOLIN SODIUM-DEXTROSE 2-4 GM/100ML-% IV SOLN
INTRAVENOUS | Status: AC
  Filled 2019-01-06: qty 100

## 2019-01-06 MED ORDER — GABAPENTIN 300 MG PO CAPS
ORAL_CAPSULE | ORAL | Status: AC
  Filled 2019-01-06: qty 1

## 2019-01-06 MED ORDER — LACTATED RINGERS IV SOLN
INTRAVENOUS | Status: DC
  Administered 2019-01-06 (×2): via INTRAVENOUS

## 2019-01-06 MED ORDER — SUGAMMADEX SODIUM 200 MG/2ML IV SOLN
INTRAVENOUS | Status: DC | PRN
  Administered 2019-01-06: 200 mg via INTRAVENOUS

## 2019-01-06 MED ORDER — FENTANYL CITRATE (PF) 100 MCG/2ML IJ SOLN
50.0000 ug | INTRAMUSCULAR | Status: DC | PRN
  Administered 2019-01-06: 25 ug via INTRAVENOUS
  Administered 2019-01-06: 100 ug via INTRAVENOUS

## 2019-01-06 MED ORDER — BUPIVACAINE-EPINEPHRINE (PF) 0.5% -1:200000 IJ SOLN
INTRAMUSCULAR | Status: AC
  Filled 2019-01-06: qty 30

## 2019-01-06 MED ORDER — DEXAMETHASONE SODIUM PHOSPHATE 10 MG/ML IJ SOLN
INTRAMUSCULAR | Status: AC
  Filled 2019-01-06: qty 2

## 2019-01-06 MED ORDER — ACETAMINOPHEN 500 MG PO TABS
ORAL_TABLET | ORAL | Status: AC
  Filled 2019-01-06: qty 2

## 2019-01-06 SURGICAL SUPPLY — 101 items
ADH SKN CLS APL DERMABOND .7 (GAUZE/BANDAGES/DRESSINGS) ×6
APL PRP STRL LF DISP 70% ISPRP (MISCELLANEOUS) ×6
APL SKNCLS STERI-STRIP NONHPOA (GAUZE/BANDAGES/DRESSINGS)
APPLIER CLIP 9.375 MED OPEN (MISCELLANEOUS)
APR CLP MED 9.3 20 MLT OPN (MISCELLANEOUS)
BAG DECANTER FOR FLEXI CONT (MISCELLANEOUS) ×5 IMPLANT
BENZOIN TINCTURE PRP APPL 2/3 (GAUZE/BANDAGES/DRESSINGS) IMPLANT
BINDER BREAST 3XL (GAUZE/BANDAGES/DRESSINGS) IMPLANT
BINDER BREAST LRG (GAUZE/BANDAGES/DRESSINGS) IMPLANT
BINDER BREAST MEDIUM (GAUZE/BANDAGES/DRESSINGS) IMPLANT
BINDER BREAST XLRG (GAUZE/BANDAGES/DRESSINGS) IMPLANT
BINDER BREAST XXLRG (GAUZE/BANDAGES/DRESSINGS) ×2 IMPLANT
BLADE CLIPPER SURG (BLADE) IMPLANT
BLADE SURG 10 STRL SS (BLADE) ×18 IMPLANT
BLADE SURG 15 STRL LF DISP TIS (BLADE) ×3 IMPLANT
BLADE SURG 15 STRL SS (BLADE)
BNDG GAUZE ELAST 4 BULKY (GAUZE/BANDAGES/DRESSINGS) ×10 IMPLANT
CANISTER SUCT 1200ML W/VALVE (MISCELLANEOUS) ×5 IMPLANT
CHLORAPREP W/TINT 26 (MISCELLANEOUS) ×10 IMPLANT
CLIP APPLIE 9.375 MED OPEN (MISCELLANEOUS) IMPLANT
CLIP VESOCCLUDE MED 6/CT (CLIP) IMPLANT
CLOSURE WOUND 1/2 X4 (GAUZE/BANDAGES/DRESSINGS)
COVER BACK TABLE REUSABLE LG (DRAPES) ×5 IMPLANT
COVER MAYO STAND REUSABLE (DRAPES) ×5 IMPLANT
COVER WAND RF STERILE (DRAPES) IMPLANT
DECANTER SPIKE VIAL GLASS SM (MISCELLANEOUS) IMPLANT
DERMABOND ADVANCED (GAUZE/BANDAGES/DRESSINGS) ×4
DERMABOND ADVANCED .7 DNX12 (GAUZE/BANDAGES/DRESSINGS) ×6 IMPLANT
DRAIN CHANNEL 15F RND FF W/TCR (WOUND CARE) ×2 IMPLANT
DRAPE HALF SHEET 70X43 (DRAPES) ×10 IMPLANT
DRAPE INCISE IOBAN 66X45 STRL (DRAPES) ×2 IMPLANT
DRAPE TOP ARMCOVERS (MISCELLANEOUS) ×5 IMPLANT
DRAPE U-SHAPE 76X120 STRL (DRAPES) ×5 IMPLANT
DRAPE UTILITY XL STRL (DRAPES) ×5 IMPLANT
DRSG PAD ABDOMINAL 8X10 ST (GAUZE/BANDAGES/DRESSINGS) ×10 IMPLANT
ELECT BLADE 4.0 EZ CLEAN MEGAD (MISCELLANEOUS)
ELECT COATED BLADE 2.86 ST (ELECTRODE) ×5 IMPLANT
ELECT REM PT RETURN 9FT ADLT (ELECTROSURGICAL) ×5
ELECTRODE BLDE 4.0 EZ CLN MEGD (MISCELLANEOUS) ×3 IMPLANT
ELECTRODE REM PT RTRN 9FT ADLT (ELECTROSURGICAL) ×3 IMPLANT
EVACUATOR SILICONE 100CC (DRAIN) ×2 IMPLANT
GAUZE SPONGE 4X4 12PLY STRL LF (GAUZE/BANDAGES/DRESSINGS) IMPLANT
GLOVE BIO SURGEON STRL SZ 6 (GLOVE) ×14 IMPLANT
GLOVE BIO SURGEON STRL SZ 6.5 (GLOVE) ×1 IMPLANT
GLOVE BIO SURGEON STRL SZ7 (GLOVE) ×10 IMPLANT
GLOVE BIO SURGEONS STRL SZ 6.5 (GLOVE) ×1
GLOVE BIOGEL PI IND STRL 7.0 (GLOVE) IMPLANT
GLOVE BIOGEL PI IND STRL 7.5 (GLOVE) IMPLANT
GLOVE BIOGEL PI INDICATOR 7.0 (GLOVE) ×4
GLOVE BIOGEL PI INDICATOR 7.5 (GLOVE) ×6
GOWN STRL REUS W/ TWL LRG LVL3 (GOWN DISPOSABLE) ×6 IMPLANT
GOWN STRL REUS W/ TWL XL LVL3 (GOWN DISPOSABLE) IMPLANT
GOWN STRL REUS W/TWL LRG LVL3 (GOWN DISPOSABLE) ×10
GOWN STRL REUS W/TWL XL LVL3 (GOWN DISPOSABLE) ×15
IMPL BREAST P6.4XRND XFULL 615 (Breast) IMPLANT
IMPL BRST P6.4XRND XFULL 615CC (Breast) ×3 IMPLANT
IMPLANT BREAST GEL 615CC (Breast) ×5 IMPLANT
IV NS 500ML (IV SOLUTION)
IV NS 500ML BAXH (IV SOLUTION) ×3 IMPLANT
KIT FILL SYSTEM UNIVERSAL (SET/KITS/TRAYS/PACK) IMPLANT
MARKER SKIN DUAL TIP RULER LAB (MISCELLANEOUS) IMPLANT
NDL FILTER BLUNT 18X1 1/2 (NEEDLE) ×3 IMPLANT
NDL HYPO 25X1 1.5 SAFETY (NEEDLE) IMPLANT
NDL PRECISIONGLIDE 27X1.5 (NEEDLE) IMPLANT
NEEDLE FILTER BLUNT 18X 1/2SAF (NEEDLE) ×2
NEEDLE FILTER BLUNT 18X1 1/2 (NEEDLE) ×3 IMPLANT
NEEDLE HYPO 25X1 1.5 SAFETY (NEEDLE) ×5 IMPLANT
NEEDLE PRECISIONGLIDE 27X1.5 (NEEDLE) IMPLANT
NS IRRIG 1000ML POUR BTL (IV SOLUTION) ×5 IMPLANT
PACK BASIN DAY SURGERY FS (CUSTOM PROCEDURE TRAY) ×5 IMPLANT
PENCIL BUTTON HOLSTER BLD 10FT (ELECTRODE) ×5 IMPLANT
PIN SAFETY STERILE (MISCELLANEOUS) ×5 IMPLANT
SIZER BREAST REUSE XFP 615CC (SIZER) ×5
SIZER BRST REUSE XFP 615CC (SIZER) IMPLANT
SLEEVE SCD COMPRESS KNEE MED (MISCELLANEOUS) ×5 IMPLANT
SPONGE GAUZE 2X2 8PLY STER LF (GAUZE/BANDAGES/DRESSINGS)
SPONGE GAUZE 2X2 8PLY STRL LF (GAUZE/BANDAGES/DRESSINGS) IMPLANT
SPONGE LAP 18X18 RF (DISPOSABLE) ×18 IMPLANT
STAPLER VISISTAT 35W (STAPLE) ×5 IMPLANT
STRIP CLOSURE SKIN 1/2X4 (GAUZE/BANDAGES/DRESSINGS) IMPLANT
SUT ETHILON 2 0 FS 18 (SUTURE) ×5 IMPLANT
SUT MNCRL AB 4-0 PS2 18 (SUTURE) ×16 IMPLANT
SUT PDS AB 2-0 CT2 27 (SUTURE) IMPLANT
SUT SILK 2 0 SH (SUTURE) IMPLANT
SUT VIC AB 3-0 PS1 18 (SUTURE) ×5
SUT VIC AB 3-0 PS1 18XBRD (SUTURE) ×12 IMPLANT
SUT VIC AB 3-0 SH 27 (SUTURE) ×5
SUT VIC AB 3-0 SH 27X BRD (SUTURE) ×3 IMPLANT
SUT VICRYL 4-0 PS2 18IN ABS (SUTURE) ×10 IMPLANT
SYR 20ML LL LF (SYRINGE) ×2 IMPLANT
SYR BULB 3OZ (MISCELLANEOUS) IMPLANT
SYR BULB IRRIGATION 50ML (SYRINGE) ×10 IMPLANT
SYR CONTROL 10ML LL (SYRINGE) ×5 IMPLANT
TAPE MEASURE VINYL STERILE (MISCELLANEOUS) IMPLANT
TOWEL GREEN STERILE FF (TOWEL DISPOSABLE) ×10 IMPLANT
TRAY DSU PREP LF (CUSTOM PROCEDURE TRAY) IMPLANT
TRAY FOLEY W/BAG SLVR 14FR LF (SET/KITS/TRAYS/PACK) IMPLANT
TUBE CONNECTING 20'X1/4 (TUBING) ×2
TUBE CONNECTING 20X1/4 (TUBING) ×8 IMPLANT
UNDERPAD 30X36 HEAVY ABSORB (UNDERPADS AND DIAPERS) ×10 IMPLANT
YANKAUER SUCT BULB TIP NO VENT (SUCTIONS) ×5 IMPLANT

## 2019-01-06 NOTE — Anesthesia Postprocedure Evaluation (Signed)
Anesthesia Post Note  Patient: Akaysha Hancock  Procedure(s) Performed: REMOVAL OF RIGHT TISSUE EXPANDER AND PLACEMENT OF IMPLANT (Right Breast) LEFT REDUCTION  (BREAST) (Left Breast) REMOVAL LEFT CHEST PORT (Left Chest)     Patient location during evaluation: PACU Anesthesia Type: General Level of consciousness: awake and alert, patient cooperative and oriented Pain management: pain level controlled Vital Signs Assessment: post-procedure vital signs reviewed and stable Respiratory status: spontaneous breathing, nonlabored ventilation and respiratory function stable Cardiovascular status: blood pressure returned to baseline and stable Postop Assessment: no apparent nausea or vomiting Anesthetic complications: no    Last Vitals:  Vitals:   01/06/19 1215 01/06/19 1230  BP: 108/65 122/78  Pulse: 76 78  Resp: 16 14  Temp:  36.4 C  SpO2: 96% 94%    Last Pain:  Vitals:   01/06/19 1230  TempSrc:   PainSc: 3                  Eddi Hymes,E. Armonie Mettler

## 2019-01-06 NOTE — Interval H&P Note (Signed)
History and Physical Interval Note:  01/06/2019 6:53 AM  Lauren Holt  has presented today for surgery, with the diagnosis of history right breast cancer, acquired absence breast.  The various methods of treatment have been discussed with the patient and family. After consideration of risks, benefits and other options for treatment, the patient has consented to  Procedure(s): REMOVAL OF RIGHT TISSUE EXPANDER AND PLACEMENT OF IMPLANT (Right) LEFT REDUCTION  (BREAST) (Left) POSSIBLE REMOVAL LEFT CHEST PORT (Left) as a surgical intervention.  The patient's history has been reviewed, patient examined, no change in status, stable for surgery.  I have reviewed the patient's chart and labs.  Questions were answered to the patient's satisfaction.     Arnoldo Hooker Drucilla Cumber

## 2019-01-06 NOTE — Op Note (Addendum)
Operative Note   DATE OF OPERATION: 9.25.20  LOCATION: [facility]-outpatient  SURGICAL DIVISION: Plastic Surgery  PREOPERATIVE DIAGNOSES:  1. History breast cancer 2. Acquired absence breast   POSTOPERATIVE DIAGNOSES:  same  PROCEDURE:  1. Removal right chest tissue expander and placement silicone implant 2. Left breast reduction 3. Removal left chest port  SURGEON: Irene Limbo MD MBA  ASSISTANT: none  ANESTHESIA:  General.   EBL: 50 ml  COMPLICATIONS: None immediate.   INDICATIONS FOR PROCEDURE:  The patient, Lauren Holt, is a 78 y.o. female born on October 27, 1940, is here for staged breast reconstruction following right skin reduction pattern mastectomy with immediate prepectoral expander acellular dermis reconstruction.    FINDINGS: Complete incorporation ADM noted right chest. Natrelle Inspira Smooth Round Extra Projection 615 implant placed right chest. REF [id number]. Left breast reduction 400 g  DESCRIPTION OF PROCEDURE:  The patient's operative site was marked with the patient in the preoperative areaincluding chest midline, sternal notch, breast meridians, anterior axillary lines. Overleftbreast location of nipple position marked symmetric with anticipated most projecting point over right implant. Breast displaced against meridian, and with aid of a Wise pattern marker, location for NAC and vertical limbs marked (8 cm).The patientwas taken to the operating room. SCDs were placed and IV antibiotics were given. The patient's operative site was prepped and draped in a sterile fashion. A time out was performed and all information was confirmed to be correct. Incision made in left chest scar over port and carried through superficial fascia and capsule. Port removed intact. Closure completed with 4-0 vicryl to close superficial fascia and dermis followed by 4-0 monocryl subcuticular for skin closure.  In supine position, inframammary fold marked  overrightand superior medialpedicle designed. NAC marked with 61mm cookie cutter and pedicle de epithelialized.Pedicle developed to chest wall. Medial and lateral flaps developed. Lower pole breast tissue excised. Additional lateral breast tissue excised.. Cavity irrigated. Hemostasis obtained. Local anesthetic infiltrated. 15 Fr JP placed and secured with 2-0 nylon. Breast tailor tacked closed.  Incision made inrightIMF scar and carried through superficial fascia and ADM. Expander removed.Capsulotomy performed superiorly.Sizer placed. Patient brought to upright sitting position and Natrelle Extra Projection 615 ml implant selected. Patient returned to supine position.  Over right chest, cavity irrigated with saline solution containing Ancef, bacitracin, and gentamicin. Hemostasis ensured. Cavity irrigated with saline Betadine solution. Implant placed in cavity, orientation ensured. Closure completed with 3-0 vicryl for closure ADM and superficial fascia, 4-0 vicryl in dermis, and skin closure 4-0 monocryl subcuticular.   Overleftbreast, closure completed with 3-0 vicryl in dermis along vertical closure and inframammary fold. NAC inset with 4-0 vicryl in dermis. Skin closure completed with 4-0 monocryl subcuticular closure.Dermabond applied to all chestincisions. Dry dressing, and breast binder applied.  The patient was allowed to wake from anesthesia, extubated and taken to the recovery room in satisfactory condition.   SPECIMENS: left breast reduction  DRAINS: 15 Fr JP in left breast  Irene Limbo, MD [facility] [phone], pin [phone]

## 2019-01-06 NOTE — Discharge Instructions (Signed)
May take next dose of Tylenol at 12:45 PM on 01/06/2019 if needed.    Post Anesthesia Home Care Instructions  Activity: Get plenty of rest for the remainder of the day. A responsible individual must stay with you for 24 hours following the procedure.  For the next 24 hours, DO NOT: -Drive a car -Paediatric nurse -Drink alcoholic beverages -Take any medication unless instructed by your physician -Make any legal decisions or sign important papers.  Meals: Start with liquid foods such as gelatin or soup. Progress to regular foods as tolerated. Avoid greasy, spicy, heavy foods. If nausea and/or vomiting occur, drink only clear liquids until the nausea and/or vomiting subsides. Call your physician if vomiting continues.  Special Instructions/Symptoms: Your throat may feel dry or sore from the anesthesia or the breathing tube placed in your throat during surgery. If this causes discomfort, gargle with warm salt water. The discomfort should disappear within 24 hours.  If you had a scopolamine patch placed behind your ear for the management of post- operative nausea and/or vomiting:  1. The medication in the patch is effective for 72 hours, after which it should be removed.  Wrap patch in a tissue and discard in the trash. Wash hands thoroughly with soap and water. 2. You may remove the patch earlier than 72 hours if you experience unpleasant side effects which may include dry mouth, dizziness or visual disturbances. 3. Avoid touching the patch. Wash your hands with soap and water after contact with the patch.      About my Jackson-Pratt Bulb Drain  What is a Jackson-Pratt bulb? A Jackson-Pratt is a soft, round device used to collect drainage. It is connected to a long, thin drainage catheter, which is held in place by one or two small stiches near your surgical incision site. When the bulb is squeezed, it forms a vacuum, forcing the drainage to empty into the bulb.  Emptying the  Jackson-Pratt bulb- To empty the bulb: 1. Release the plug on the top of the bulb. 2. Pour the bulb's contents into a measuring container which your nurse will provide. 3. Record the time emptied and amount of drainage. Empty the drain(s) as often as your     doctor or nurse recommends.  Date                  Time                    Amount (Drain 1)                 Amount (Drain 2)  _____________________________________________________________________  _____________________________________________________________________  _____________________________________________________________________  _____________________________________________________________________  _____________________________________________________________________  _____________________________________________________________________  _____________________________________________________________________  _____________________________________________________________________  Squeezing the Jackson-Pratt Bulb- To squeeze the bulb: 1. Make sure the plug at the top of the bulb is open. 2. Squeeze the bulb tightly in your fist. You will hear air squeezing from the bulb. 3. Replace the plug while the bulb is squeezed. 4. Use a safety pin to attach the bulb to your clothing. This will keep the catheter from     pulling at the bulb insertion site.  When to call your doctor- Call your doctor if:  Drain site becomes red, swollen or hot.  You have a fever greater than 101 degrees F.  There is oozing at the drain site.  Drain falls out (apply a guaze bandage over the drain hole and secure it with tape).  Drainage increases daily not related to activity patterns. (You  will usually have more drainage when you are active than when you are resting.)  Drainage has a bad odor.     JP Drain [employer] this sheet to all of your post-operative appointments while you have your drains.  Please measure your drains by  CC's or ML's.  Make sure you drain and measure your JP Drains 2 or 3 times per day.  At the end of each day, add up totals for the left side and add up totals for the right side.    ( 9 am )     ( 3 pm )        ( 9 pm )                Date L  R  L  R  L  R  Total L/R

## 2019-01-06 NOTE — Transfer of Care (Signed)
Immediate Anesthesia Transfer of Care Note  Patient: Lauren Holt  Procedure(s) Performed: REMOVAL OF RIGHT TISSUE EXPANDER AND PLACEMENT OF IMPLANT (Right Breast) LEFT REDUCTION  (BREAST) (Left Breast) REMOVAL LEFT CHEST PORT (Left Chest)  Patient Location: PACU  Anesthesia Type:General  Level of Consciousness: drowsy and patient cooperative  Airway & Oxygen Therapy: Patient Spontanous Breathing and Patient connected to face mask oxygen  Post-op Assessment: Report given to RN and Post -op Vital signs reviewed and stable  Post vital signs: Reviewed and stable  Last Vitals:  Vitals Value Taken Time  BP    Temp    Pulse    Resp    SpO2      Last Pain:  Vitals:   01/06/19 0639  TempSrc: Oral  PainSc: 0-No pain      Patients Stated Pain Goal: 4 ([id number])  Complications: No apparent anesthesia complications

## 2019-01-06 NOTE — Anesthesia Procedure Notes (Signed)
Procedure Name: Intubation Date/Time: 01/06/2019 7:32 AM Performed by: Signe Colt, CRNA Pre-anesthesia Checklist: Patient identified, Emergency Drugs available, Suction available and Patient being monitored Patient Re-evaluated:Patient Re-evaluated prior to induction Oxygen Delivery Method: Circle system utilized Preoxygenation: Pre-oxygenation with 100% oxygen Induction Type: IV induction Ventilation: Mask ventilation without difficulty Laryngoscope Size: Mac and 4 Grade View: Grade I Tube type: Oral Tube size: 7.0 mm Number of attempts: 1 Airway Equipment and Method: Stylet and Oral airway Placement Confirmation: ETT inserted through vocal cords under direct vision,  positive ETCO2 and breath sounds checked- equal and bilateral Secured at: 21 cm Tube secured with: Tape Dental Injury: Teeth and Oropharynx as per pre-operative assessment

## 2019-01-09 ENCOUNTER — Encounter (HOSPITAL_BASED_OUTPATIENT_CLINIC_OR_DEPARTMENT_OTHER): Payer: Self-pay | Admitting: Plastic Surgery

## 2019-01-10 LAB — SURGICAL PATHOLOGY

## 2019-01-25 DIAGNOSIS — H25012 Cortical age-related cataract, left eye: Secondary | ICD-10-CM | POA: Diagnosis not present

## 2019-01-25 DIAGNOSIS — H2512 Age-related nuclear cataract, left eye: Secondary | ICD-10-CM | POA: Diagnosis not present

## 2019-01-25 DIAGNOSIS — H2511 Age-related nuclear cataract, right eye: Secondary | ICD-10-CM | POA: Diagnosis not present

## 2019-01-25 DIAGNOSIS — H25011 Cortical age-related cataract, right eye: Secondary | ICD-10-CM | POA: Diagnosis not present

## 2019-02-01 NOTE — Progress Notes (Signed)
[place]  [phone] [phone]     ID: Lauren Holt DOB: 26-Feb-1941  MR#: [id number]  EPP#:[id number]  Patient Care Team: Alroy Dust, Carlean Jews.Marlou Sa, MD as PCP - General (Family Medicine) Jovita Kussmaul, MD as Consulting Physician (General Surgery) Marlin Brys, Virgie Dad, MD as Consulting Physician (Oncology) Kyung Rudd, MD as Consulting Physician (Radiation Oncology) OTHER MD:  I connected with Lauren Holt on 02/02/19 at  2:30 PM EDT by telephone visit and verified that I am speaking with the correct person using two identifiers.   I discussed the limitations, risks, security and privacy concerns of performing an evaluation and management service by telemedicine and the availability of in-person appointments. I also discussed with the patient that there may be a patient responsible charge related to this service. The patient expressed understanding and agreed to proceed.   Other persons participating in the visit and their role in the encounter: none    Patient's location: Home Provider's location: Clinic    CHIEF COMPLAINT: Estrogen receptor positive breast cancer (s/p right mastectomy)  CURRENT TREATMENT: Observation   INTERVAL HISTORY: Lauren Holt was contacted today for follow-up of her estrogen receptor positive breast cancer.   She stopped taking her anastrozole in late 07/2018 because of fatigue and joint aches.  At her survivorship visit on 10/20/2018, Lauren Bihari, NP switched her to letrozole. She never started that medication.  She says she does not want to put anything toxic in her body.  She underwent coronavirus testing on 01/03/2019 ahead of her scheduled implant placement. This was negative.  She underwent implant placement on 01/06/2019 under Dr. Iran Planas. Pathology from the mammoplasty ([id number]) showed no evidence of malignancy.  She says that she now has a mild infection in the reconstructed breast and is being treated with  doxycycline for this  She is scheduled for bone density testing on 03/02/2019.  She will be due for repeat mammography in 06/2019.   REVIEW OF SYSTEMS: Lauren Holt says that after going off the anastrozole she did not feel so tired or so achy anymore.  She is satisfied with the cosmetic results of her reconstruction.  She is very worried about the liver MRI which had to be postponed because of her expander being in place.  She is now eager to proceed with this test and we discussed that at length.  She is keeping appropriate pandemic precautions.  Aside from this detailed review of systems today was stable   HISTORY OF CURRENT ILLNESS: On the original intake note:  Lauren Holt had routine screening mammography on 06/14/2017 showing a possible area of calcifications in the right breast. She underwent unilateral right diagnostic mammography with tomography at [facility] on 06/23/2017 showing: Suspicious calcifications within the inferior right breast, spanning nearly 10 cm, more superior component spanning 4 cm extent. The more superior component may have a small associated mass. Ultrasonography of the right axilla on 07/01/2017 showed normal right axillary lymph nodes.   Accordingly on 06/29/2017 she proceeded to biopsy of the right breast area in question. The pathology from this procedure showed ([id number]): In the right breast lower central, more lateral superior: invasive ductal carcinoma grade I. In the lower central, more medial inferior: Ductal carcinoma in situ, high grade, with calcifications. The  invasive tumor was significant for estrogen receptor, 100% positive and progesterone receptor, 10% positive, both with strong staining intensity. Proliferation marker Ki67 at 5%. HER2  not amplified. The ductal carcinoma in situ was significant for estrogen receptor, 100% positive and  progesterone receptor, 80% positive, both with strong staining intensity.   The patient's subsequent  history is as detailed below.   PAST MEDICAL HISTORY: Past Medical History:  Diagnosis Date  . Anxiety   . Breast cancer, right breast ([facility])   . Dermatitis   . Family history of adverse reaction to anesthesia    "I think my sister was hard to wake up" (09/24/2017)  . Family history of breast cancer 07/12/2017  . Family history of prostate cancer 07/12/2017  . Genetic testing   . Palpitations    when she lays down, has not seen anyone for this, feels like a flutter   HLD ( but not too high)   PAST SURGICAL HISTORY: Past Surgical History:  Procedure Laterality Date  . BREAST BIOPSY Right 06/2017  . BREAST RECONSTRUCTION WITH PLACEMENT OF TISSUE EXPANDER AND ALLODERM Right 09/24/2017  . BREAST RECONSTRUCTION WITH PLACEMENT OF TISSUE EXPANDER AND ALLODERM Right 09/24/2017   Procedure: BREAST RECONSTRUCTION WITH PLACEMENT OF TISSUE EXPANDER AND ALLODERM;  Surgeon: Irene Limbo, MD;  Location: [facility];  Service: Plastics;  Laterality: Right;  . BREAST REDUCTION SURGERY Left 01/06/2019   Procedure: LEFT REDUCTION  (BREAST);  Surgeon: Irene Limbo, MD;  Location: [facility];  Service: Plastics;  Laterality: Left;  . CARPAL TUNNEL RELEASE Bilateral   . COLONOSCOPY    . MASTECTOMY COMPLETE / SIMPLE W/ SENTINEL NODE BIOPSY Right 09/24/2017  . MASTECTOMY W/ SENTINEL NODE BIOPSY Right 09/24/2017   Procedure: MASTECTOMY WITH SENTINEL LYMPH NODE BIOPSY;  Surgeon: Jovita Kussmaul, MD;  Location: [facility];  Service: General;  Laterality: Right;  . PORT-A-CATH REMOVAL Left 01/06/2019   Procedure: REMOVAL LEFT CHEST PORT;  Surgeon: Irene Limbo, MD;  Location: [facility];  Service: Plastics;  Laterality: Left;  . PORTACATH PLACEMENT Left 11/08/2017   Procedure: INSERTION PORT-A-CATH;  Surgeon: Jovita Kussmaul, MD;  Location: [facility];  Service: General;  Laterality: Left;  . REMOVAL OF TISSUE EXPANDER AND PLACEMENT OF IMPLANT Right 01/06/2019   Procedure: REMOVAL OF RIGHT  TISSUE EXPANDER AND PLACEMENT OF IMPLANT;  Surgeon: Irene Limbo, MD;  Location: [facility];  Service: Plastics;  Laterality: Right;  . TUBAL LIGATION    . VAGINAL HYSTERECTOMY     Partial Hysterectomy without BSO. Because they were too close to the bladder.    FAMILY HISTORY Family History  Problem Relation Age of Onset  . Breast cancer Paternal Aunt   . Breast cancer Cousin 70  . Stroke Mother        caused blindness  . Prostate cancer Father 20  . Breast cancer Cousin 76  . Breast cancer Cousin 25  . Breast cancer Cousin 68  . Breast cancer Other    The patient's father died at age 51 due to emphysema. The patient's mother is alive at age 76, turning 96 March 2020. The patient has 5 brothers and 3 sisters. There was a paternal great aunt with breast cancer. There was also a paternal cousin with breast cancer.    GYNECOLOGIC HISTORY:  No LMP recorded. Patient has had a hysterectomy. Menarche: 78 years old Age at first live birth: 78 years old The patient is GXP3. She is s/p partial hysterectomy. Both of her ovaries remain. She used oral contraceptives with no complications. She also used HRT for 2 years without complications.     SOCIAL HISTORY:  Lauren Holt is now retired. 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 She is divorced. Her oldest daughter, Lauren Holt", is a 5th Land in [place], [place]. The patient's son, Lauren Holt, works in a Proofreader in [place]. The patient's son, Lauren Holt, works in Quarry manager in [place].  The patient has 4 grandchildren. She currently does not belong to a church.   ADVANCED DIRECTIVES: Not in place.  At the 07/07/2017 visit the patient was given the appropriate documents to complete and notarized at her discretion.   HEALTH MAINTENANCE: Social History   Tobacco Use  . Smoking status: Former Smoker    Years: 1.00    Types: Cigarettes  . Smokeless tobacco: Never Used  .  Tobacco comment: Smoked X 1 year at age 74 (less than ppd)  Substance Use Topics  . Alcohol use: Yes    Comment: social  . Drug use: Never     Colonoscopy: 02/2017 at [facility]  PAP:   Bone density: 2018/ osteopenia   No Known Allergies  Current Outpatient Medications  Medication Sig Dispense Refill  . ALPRAZolam (XANAX) 0.25 MG tablet Take 0.125 mg by mouth 2 (two) times daily as needed for anxiety.     Marland Kitchen BETA CAROTENE PO Take 7,500 mcg by mouth daily.    . calcium carbonate (CALCIUM 600) 600 MG TABS tablet Take 600 mg by mouth daily.    . Cholecalciferol (VITAMIN D-3) 5000 units TABS Take 1 tablet by mouth daily.    . Lutein 20 MG CAPS Take 20 mg by mouth daily.     . Multiple Vitamins-Minerals (ICAPS AREDS FORMULA PO) Take 1 capsule by mouth 3 (three) times a week.     . Probiotic Product (PROBIOTIC DAILY PO) Take 1 capsule by mouth 3 (three) times a week.     . Red Yeast Rice 600 MG CAPS Take 600 mg by mouth daily.     Marland Kitchen VITAMIN E PO Take 180 mg by mouth daily.    Marland Kitchen zinc gluconate 50 MG tablet Take 50 mg by mouth daily as needed (cold prevention).     No current facility-administered medications for this visit.     OBJECTIVE: Older white woman   There were no vitals filed for this visit.   There is no height or weight on file to calculate BMI.   Wt Readings from Last 3 Encounters:  01/06/19 173 lb 11.6 oz (78.8 kg)  05/18/18 180 lb 9.6 oz (81.9 kg)  03/30/18 174 lb 4.8 oz (79.1 kg)  ECOG FS:1 - Symptomatic but completely ambulatory   Televisit  LAB RESULTS:  CMP     Component Value Date/Time   NA 139 05/18/2018 1443   K 4.0 05/18/2018 1443   CL 103 05/18/2018 1443   CO2 28 05/18/2018 1443   GLUCOSE 92 05/18/2018 1443   BUN 10 05/18/2018 1443   CREATININE 0.73 05/18/2018 1443   CREATININE 0.71 12/15/2017 1049   CALCIUM 9.3 05/18/2018 1443   PROT 7.1 05/18/2018 1443   ALBUMIN 3.9 05/18/2018 1443   AST 22 05/18/2018 1443   AST 15 12/15/2017 1049    ALT 19 05/18/2018 1443   ALT 13 12/15/2017 1049   ALKPHOS 86 05/18/2018 1443   BILITOT 0.3 05/18/2018 1443   BILITOT 0.4 12/15/2017 1049   GFRNONAA >60 05/18/2018 1443   GFRNONAA >60 12/15/2017 1049   GFRAA >60 05/18/2018 1443   GFRAA >60 12/15/2017 1049    No results found for: TOTALPROTELP, ALBUMINELP, A1GS, A2GS, BETS, BETA2SER, GAMS, MSPIKE, SPEI  No  results found for: Nils Pyle, [facility]  Lab Results  Component Value Date   WBC 5.1 05/18/2018   NEUTROABS 3.4 05/18/2018   HGB 11.4 (L) 05/18/2018   HCT 36.1 05/18/2018   MCV 104.6 (H) 05/18/2018   PLT 245 05/18/2018    _0 @  No results found for: LABCA2  No components found for: WERXVQ008  No results for input(s): INR in the last 168 hours.  No results found for: LABCA2  No results found for: QPY195  No results found for: KDT267  No results found for: TIW580  No results found for: CA2729  No components found for: HGQUANT  No results found for: CEA1 / No results found for: CEA1   No results found for: AFPTUMOR  No results found for: CHROMOGRNA  No results found for: PSA1  No visits with results within 3 Day(s) from this visit.  Latest known visit with results is:  Admission on 01/06/2019, Discharged on 01/06/2019  Component Date Value Ref Range Status  . SURGICAL PATHOLOGY 01/06/2019    Final-Edited                   Value:SURGICAL PATHOLOGY CASE: [id number] PATIENT: Lauren Holt Surgical Pathology Report     Clinical History: history right breast cancer, acquired absence breast (cm)     DIAGNOSIS:  A. BREAST, LEFT, MAMMOPLASTY: - Radial scar(s) with calcifications. - Fibrocystic changes with adenosis and calcifications. - There is no evidence of malignancy.   GROSS DESCRIPTION:  Specimen: Left breast tissue Weight: Clinical weight is 400 g, weight at gross is 395 g Size in aggregate: 15 x 14 x 3.5 cm Skin: There is tan-white unremarkable skin  Cut Surface: Predominantly soft fatty tissue, with minimal gray-white soft, focally nodular fibrous tissue.  A discrete lesion or mass is not identified. Block Summary: Representative sections in 5 blocks.     Final Diagnosis performed by Enid Cutter, MD.   Electronically signed 01/10/2019 Technical component performed at [facility]. [facility], [place] [address], [place], [place] [address].  Professional componen                         t performed at [facility], [place] [address], [place], [place] [address].  Immunohistochemistry Technical component (if applicable) was performed at [facility]. [address], [place], [place], [place] [address].   IMMUNOHISTOCHEMISTRY DISCLAIMER (if applicable): Some of these immunohistochemical stains may have been developed and the performance characteristics determine by [facility]. Some may not have been cleared or approved by the U.S. Food and Drug Administration. The FDA has determined that such clearance or approval is not necessary. This test is used for clinical purposes. It should not be regarded as investigational or for research. This laboratory is certified under the [place] (CLIA-88) as qualified to perform high complexity clinical laboratory testing.  The controls stained appropriately.     (this displays the last labs from the last 3 days)  No results found for: TOTALPROTELP, ALBUMINELP, A1GS, A2GS, BETS, BETA2SER, GAMS, MSPIKE, SPEI (this displays SPEP labs)  No results found for: KPAFRELGTCHN, LAMBDASER, KAPLAMBRATIO (kappa/lambda light chains)  No results found for: HGBA, HGBA2QUANT, HGBFQUANT, HGBSQUAN (Hemoglobinopathy evaluation)   No results found for: LDH  No results found for: IRON, TIBC, IRONPCTSAT (Iron and TIBC)  No results found for: FERRITIN  Urinalysis    Component Value Date/Time   COLORURINE STRAW (A)  04/12/2018  [place] 04/12/2018 1426   LABSPEC 1.003 (L) 04/12/2018 1426   PHURINE 6.0 04/12/2018 1426   GLUCOSEU NEGATIVE 04/12/2018 1426   HGBUR NEGATIVE 04/12/2018 1426   BILIRUBINUR NEGATIVE 04/12/2018 1426   KETONESUR NEGATIVE 04/12/2018 1426   PROTEINUR NEGATIVE 04/12/2018 1426   NITRITE NEGATIVE 04/12/2018 1426   LEUKOCYTESUR NEGATIVE 04/12/2018 1426     STUDIES: No results found.    ELIGIBLE FOR AVAILABLE RESEARCH PROTOCOL: exact sciences study   ASSESSMENT: 78 y.o. [place], [place] woman status post right breast biopsy 07/01/2017 for a clinical mT2 N0, stage IB invasive ductal carcinoma, grade 1, estrogen and progesterone receptor positive, HER-2 nonamplified, with an MIB-105%  (1) a second area in the right breast biopsied 07/01/2017 showed ductal carcinoma in situ, grade 3, estrogen and progesterone receptor positive.  (2) status post right mastectomy and sentinel lymph node sampling 09/24/2017 for a pT1b pN0, stage IA invasive ductal carcinoma, grade 1, with negative margins  (a) a total of 5 sentinel lymph nodes were removed  (b) definitive reconstruction with smooth silicone implant placement on 01/06/2019  (3) The Oncotype DX score was 28, predicting a risk of outside the breast recurrence over the next 9 years of 17% if the patient's only systemic therapy is tamoxifen for 5 years.  It also predicts a significant benefit from chemotherapy.  (4) cyclophosphamide, methotrexate and fluorouracil (CMF) chemotherapy started 11/17/2017, repeated every 21 days x 8, last treatment 04/12/2018  (a) Udenyca added with cycle 4 and subsequent cycles due to neutropenia  (5) anastrozole started 08/04/2016, interrupted during chemotherapy, resumed FEB 2020  (a) bone density 12/07/2016 at Dr Virgilio Belling showed a T score of - 1.5 to -2.0  (6) genetics testing 07/22/2017 through the Common Hereditary Cancer Panel offered by [facility] found no deleterious mutations in APC,  ATM, AXIN2, BARD1, BMPR1A, BRCA1, BRCA2, BRIP1, CDH1, CDKN2A (p14ARF), CDKN2A (p16INK4a), CKD4, CHEK2, CTNNA1, DICER1, EPCAM (Deletion/duplication testing only), GREM1 (promoter region deletion/duplication testing only), KIT, MEN1, MLH1, MSH2, MSH3, MSH6, MUTYH, NBN, NF1, NHTL1, PALB2, PDGFRA, PMS2, POLD1, POLE, PTEN, RAD50, RAD51C, RAD51D, SDHB, SDHC, SDHD, SMAD4, SMARCA4. STK11, TP53, TSC1, TSC2, and VHL.  The following genes were evaluated for sequence changes only: SDHA and HOXB13 c.251G>A variant only.   (a) a variant of uncertain significance in the gene DICER1 was identified.  c.2116+6G>A (Intronic)  (7) CT A/P done on 02/11/2018 for LLQ pain, no cause for pain identified, but two indeterminate liver lesions were noted  (a) MRI liver scheduled for 09/13/2018, not done secondary to pending reconstruction   PLAN: Lauren Holt has completed her reconstruction.  She did not tolerate anastrozole well.  At this point has decided not to proceed with alternative antiestrogens.  She is very eager to clarify the lesions seen in her liver.  I am setting her up for a liver MRI within the next 2 to 3 weeks.  I expect that we will get results of that will show benign conditions and I will call her with those results.    She is already on Xanax.  She will take a Xanax shortly before the test.  She understands someone will need to drive her home.  Assuming that is the case, she will return to see me in January and at that point we will readdress the issue of antiestrogens.  If she wants to obtain the benefits predicted by Oncotype she will need to take antiestrogens for 5 years.  She knows to call for any other issue that may develop  before the next visit here.  Virgie Dad. Darnisha Vernet, MD  02/01/19 4:23 PM Medical Oncology and Hematology [facility] [address] [place], [place] [address] Tel. [phone]    Fax. [phone]   I, Wilburn Mylar, am acting as scribe for Dr. Virgie Dad.  Aldwin Micalizzi.  I, Lurline Del MD, have reviewed the above documentation for accuracy and completeness, and I agree with the above.

## 2019-02-02 ENCOUNTER — Inpatient Hospital Stay: Payer: Medicare HMO | Attending: Oncology | Admitting: Oncology

## 2019-02-02 ENCOUNTER — Inpatient Hospital Stay: Payer: Medicare HMO

## 2019-02-02 DIAGNOSIS — Z79899 Other long term (current) drug therapy: Secondary | ICD-10-CM | POA: Diagnosis not present

## 2019-02-02 DIAGNOSIS — F419 Anxiety disorder, unspecified: Secondary | ICD-10-CM

## 2019-02-02 DIAGNOSIS — C50311 Malignant neoplasm of lower-inner quadrant of right female breast: Secondary | ICD-10-CM | POA: Diagnosis not present

## 2019-02-02 DIAGNOSIS — Z170 Estrogen receptor positive status [ER+]: Secondary | ICD-10-CM

## 2019-02-15 DIAGNOSIS — H2511 Age-related nuclear cataract, right eye: Secondary | ICD-10-CM | POA: Diagnosis not present

## 2019-02-15 DIAGNOSIS — H25011 Cortical age-related cataract, right eye: Secondary | ICD-10-CM | POA: Diagnosis not present

## 2019-03-02 ENCOUNTER — Ambulatory Visit
Admission: RE | Admit: 2019-03-02 | Discharge: 2019-03-02 | Disposition: A | Discharge status: Home / Self Care | Payer: Medicare HMO | Source: Ambulatory Visit | Attending: Oncology | Admitting: Oncology

## 2019-03-02 ENCOUNTER — Other Ambulatory Visit: Payer: Self-pay

## 2019-03-02 DIAGNOSIS — Z170 Estrogen receptor positive status [ER+]: Secondary | ICD-10-CM

## 2019-03-02 DIAGNOSIS — M8589 Other specified disorders of bone density and structure, multiple sites: Secondary | ICD-10-CM | POA: Diagnosis not present

## 2019-03-02 DIAGNOSIS — C50311 Malignant neoplasm of lower-inner quadrant of right female breast: Secondary | ICD-10-CM

## 2019-03-02 DIAGNOSIS — Z780 Asymptomatic menopausal state: Secondary | ICD-10-CM | POA: Diagnosis not present

## 2019-03-16 DIAGNOSIS — Z0100 Encounter for examination of eyes and vision without abnormal findings: Secondary | ICD-10-CM | POA: Diagnosis not present

## 2019-03-16 DIAGNOSIS — H524 Presbyopia: Secondary | ICD-10-CM | POA: Diagnosis not present

## 2019-04-20 ENCOUNTER — Other Ambulatory Visit: Payer: Self-pay | Admitting: *Deleted

## 2019-05-02 ENCOUNTER — Ambulatory Visit (HOSPITAL_COMMUNITY)
Admission: RE | Admit: 2019-05-02 | Discharge: 2019-05-02 | Disposition: A | Discharge status: Home / Self Care | Payer: Medicare HMO | Source: Ambulatory Visit | Attending: Oncology | Admitting: Oncology

## 2019-05-02 ENCOUNTER — Other Ambulatory Visit: Payer: Self-pay

## 2019-05-02 DIAGNOSIS — K769 Liver disease, unspecified: Secondary | ICD-10-CM | POA: Diagnosis not present

## 2019-05-02 DIAGNOSIS — C50311 Malignant neoplasm of lower-inner quadrant of right female breast: Secondary | ICD-10-CM | POA: Insufficient documentation

## 2019-05-02 DIAGNOSIS — Z170 Estrogen receptor positive status [ER+]: Secondary | ICD-10-CM | POA: Insufficient documentation

## 2019-05-02 LAB — POCT I-STAT CREATININE: Creatinine, Ser: 0.7 mg/dL (ref 0.44–1.00)

## 2019-05-02 MED ORDER — GADOBUTROL 1 MMOL/ML IV SOLN
8.5000 mL | Freq: Once | INTRAVENOUS | Status: AC | PRN
  Administered 2019-05-02: 11:00:00 8.5 mL via INTRAVENOUS

## 2019-05-03 NOTE — Progress Notes (Signed)
[facility]  [phone] [phone]     ID: Lauren Holt DOB: 1940-06-12  MR#: [id number]  IRW#:[id number]  Patient Care Team: Alroy Dust, Carlean Jews.Marlou Sa, MD as PCP - General (Family Medicine) Jovita Kussmaul, MD as Consulting Physician (General Surgery) Metha Kolasa, Virgie Dad, MD as Consulting Physician (Oncology) Kyung Rudd, MD as Consulting Physician (Radiation Oncology) OTHER MD:   CHIEF COMPLAINT: Estrogen receptor positive breast cancer (s/p right mastectomy)  CURRENT TREATMENT: Observation   INTERVAL HISTORY: Lauren Holt returns today for follow-up of her estrogen receptor positive breast cancer. She is under observation alone.  Since her last visit, she underwent bone density screening on 03/02/2019. This showed a T-score of -1.6, which is considered osteopenic.  She also underwent liver MRI on 05/02/2019 for follow-up of the liver lesion incidentally noted November 2019.. Motion degraded exam shows 2 cm bilobed lesion in posterior aspect of segment IV, possible peripheral enhancement but motion degradation hinders assessment. Given no appreciable size increase over more than 1 year, this is likely benign. Incompletely characterized second tiny lesion inferior tip of right liver.  She will be due for repeat mammography in 06/2019.   REVIEW OF SYSTEMS: Dior tells me her son Lauren Holt age 24 had a cardiac event, felt likely to be an arrhythmia, as he was visiting Dr. Haroldine Laws.  He underwent CPR and has been at [facility] ICU for the last couple of weeks, with improvement but not yet out of the woods.  This is of course a great source of stress for the patient.  She herself has noted that she has some irregular beats sometimes when she is sitting watching TV.  Aside from that she wonders why her breasts seem to swell sometimes and not others.  She exercises generally by walking but has some heel spurs now that are an issue.  She likes swimming but of course is  currently too cold for that.  She is taking appropriate pandemic precautions.  A detailed review of systems today was otherwise stable   HISTORY OF CURRENT ILLNESS: On the original intake note:  Lauren Holt had routine screening mammography on 06/14/2017 showing a possible area of calcifications in the right breast. She underwent unilateral right diagnostic mammography with tomography at [facility] on 06/23/2017 showing: Suspicious calcifications within the inferior right breast, spanning nearly 10 cm, more superior component spanning 4 cm extent. The more superior component may have a small associated mass. Ultrasonography of the right axilla on 07/01/2017 showed normal right axillary lymph nodes.   Accordingly on 06/29/2017 she proceeded to biopsy of the right breast area in question. The pathology from this procedure showed ([id number]): In the right breast lower central, more lateral superior: invasive ductal carcinoma grade I. In the lower central, more medial inferior: Ductal carcinoma in situ, high grade, with calcifications. The  invasive tumor was significant for estrogen receptor, 100% positive and progesterone receptor, 10% positive, both with strong staining intensity. Proliferation marker Ki67 at 5%. HER2  not amplified. The ductal carcinoma in situ was significant for estrogen receptor, 100% positive and progesterone receptor, 80% positive, both with strong staining intensity.   The patient's subsequent history is as detailed below.   PAST MEDICAL HISTORY: Past Medical History:  Diagnosis Date  . Anxiety   . Breast cancer, right breast ([place])   . Dermatitis   . Family history of adverse reaction to anesthesia    "I think my sister was hard to wake up" (09/24/2017)  . Family history of breast  cancer 07/12/2017  . Family history of prostate cancer 07/12/2017  . Genetic testing   . Palpitations    when she lays down, has not seen anyone for this, feels like a flutter   HLD  ( but not too high)   PAST SURGICAL HISTORY: Past Surgical History:  Procedure Laterality Date  . BREAST BIOPSY Right 06/2017  . BREAST RECONSTRUCTION WITH PLACEMENT OF TISSUE EXPANDER AND ALLODERM Right 09/24/2017  . BREAST RECONSTRUCTION WITH PLACEMENT OF TISSUE EXPANDER AND ALLODERM Right 09/24/2017   Procedure: BREAST RECONSTRUCTION WITH PLACEMENT OF TISSUE EXPANDER AND ALLODERM;  Surgeon: Irene Limbo, MD;  Location: [facility];  Service: Plastics;  Laterality: Right;  . BREAST REDUCTION SURGERY Left 01/06/2019   Procedure: LEFT REDUCTION  (BREAST);  Surgeon: Irene Limbo, MD;  Location: [facility];  Service: Plastics;  Laterality: Left;  . CARPAL TUNNEL RELEASE Bilateral   . COLONOSCOPY    . MASTECTOMY COMPLETE / SIMPLE W/ SENTINEL NODE BIOPSY Right 09/24/2017  . MASTECTOMY W/ SENTINEL NODE BIOPSY Right 09/24/2017   Procedure: MASTECTOMY WITH SENTINEL LYMPH NODE BIOPSY;  Surgeon: Jovita Kussmaul, MD;  Location: [facility];  Service: General;  Laterality: Right;  . PORT-A-CATH REMOVAL Left 01/06/2019   Procedure: REMOVAL LEFT CHEST PORT;  Surgeon: Irene Limbo, MD;  Location: [facility];  Service: Plastics;  Laterality: Left;  . PORTACATH PLACEMENT Left 11/08/2017   Procedure: INSERTION PORT-A-CATH;  Surgeon: Jovita Kussmaul, MD;  Location: [place];  Service: General;  Laterality: Left;  . REMOVAL OF TISSUE EXPANDER AND PLACEMENT OF IMPLANT Right 01/06/2019   Procedure: REMOVAL OF RIGHT TISSUE EXPANDER AND PLACEMENT OF IMPLANT;  Surgeon: Irene Limbo, MD;  Location: [place];  Service: Plastics;  Laterality: Right;  . TUBAL LIGATION    . VAGINAL HYSTERECTOMY     Partial Hysterectomy without BSO. Because they were too close to the bladder.    FAMILY HISTORY Family History  Problem Relation Age of Onset  . Breast cancer Paternal Aunt   . Breast cancer Cousin 19  . Stroke Mother        caused blindness  . Prostate cancer Father 15    . Breast cancer Cousin 40  . Breast cancer Cousin 25  . Breast cancer Cousin 78  . Breast cancer Other    The patient's father died at age 76 due to emphysema. The patient's mother is alive at age 26, turning 96 March 2020. The patient has 5 brothers and 3 sisters. There was a paternal great aunt with breast cancer. There was also a paternal cousin with breast cancer.    GYNECOLOGIC HISTORY:  No LMP recorded. Patient has had a hysterectomy. Menarche: 79 years old Age at first live birth: 79 years old The patient is GXP3. She is s/p partial hysterectomy. Both of her ovaries remain. She used oral contraceptives with no complications. She also used HRT for 2 years without complications.     SOCIAL HISTORY:  Katieann is now retired. 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 She is divorced. Her oldest daughter, [name]", is a 5th Land in [place], [place]. The patient's son, [name], works in a Proofreader in [place]. The patient's son, Lauren Holt, works in Quarry manager in [place].  The patient has 4 grandchildren. She currently does not belong to a church.   ADVANCED DIRECTIVES: Not in place.  At the 07/07/2017 visit the patient was given the  appropriate documents to complete and notarized at her discretion.   HEALTH MAINTENANCE: Social History   Tobacco Use  . Smoking status: Former Smoker    Years: 1.00    Types: Cigarettes  . Smokeless tobacco: Never Used  . Tobacco comment: Smoked X 1 year at age 33 (less than ppd)  Substance Use Topics  . Alcohol use: Yes    Comment: social  . Drug use: Never     Colonoscopy: 02/2017 at [facility]  PAP:   Bone density: 2018/ osteopenia   No Known Allergies  Current Outpatient Medications  Medication Sig Dispense Refill  . ALPRAZolam (XANAX) 0.25 MG tablet Take 0.125 mg by mouth 2 (two) times daily as needed for anxiety.     Marland Kitchen BETA CAROTENE PO Take 7,500 mcg by mouth daily.     . calcium carbonate (CALCIUM 600) 600 MG TABS tablet Take 600 mg by mouth daily.    . Cholecalciferol (VITAMIN D-3) 5000 units TABS Take 1 tablet by mouth daily.    . Lutein 20 MG CAPS Take 20 mg by mouth daily.     . Multiple Vitamins-Minerals (ICAPS AREDS FORMULA PO) Take 1 capsule by mouth 3 (three) times a week.     . Probiotic Product (PROBIOTIC DAILY PO) Take 1 capsule by mouth 3 (three) times a week.     . Red Yeast Rice 600 MG CAPS Take 600 mg by mouth daily.     Marland Kitchen VITAMIN E PO Take 180 mg by mouth daily.    Marland Kitchen zinc gluconate 50 MG tablet Take 50 mg by mouth daily as needed (cold prevention).     No current facility-administered medications for this visit.    OBJECTIVE: Older white woman in no acute distress  Vitals:   05/04/19 1427  BP: (!) 124/49  Pulse: 83  Resp: 18  Temp: 97.6 F (36.4 C)  SpO2: 98%     Body mass index is 30.4 kg/m.   Wt Readings from Last 3 Encounters:  05/04/19 177 lb 1.6 oz (80.3 kg)  01/06/19 173 lb 11.6 oz (78.8 kg)  05/18/18 180 lb 9.6 oz (81.9 kg)  ECOG FS:1 - Symptomatic but completely ambulatory  Sclerae unicteric, EOMs intact Wearing a mask No cervical or supraclavicular adenopathy Lungs no rales or rhonchi Heart regular rate and rhythm Abd soft, nontender, positive bowel sounds MSK no focal spinal tenderness, no upper extremity lymphedema Neuro: nonfocal, well oriented, appropriate affect Breasts:    LAB RESULTS:  CMP     Component Value Date/Time   NA 139 05/18/2018 1443   K 4.0 05/18/2018 1443   CL 103 05/18/2018 1443   CO2 28 05/18/2018 1443   GLUCOSE 92 05/18/2018 1443   BUN 10 05/18/2018 1443   CREATININE 0.70 05/02/2019 1032   CREATININE 0.71 12/15/2017 1049   CALCIUM 9.3 05/18/2018 1443   PROT 7.1 05/18/2018 1443   ALBUMIN 3.9 05/18/2018 1443   AST 22 05/18/2018 1443   AST 15 12/15/2017 1049   ALT 19 05/18/2018 1443   ALT 13 12/15/2017 1049   ALKPHOS 86 05/18/2018 1443   BILITOT 0.3 05/18/2018 1443    BILITOT 0.4 12/15/2017 1049   GFRNONAA >60 05/18/2018 1443   GFRNONAA >60 12/15/2017 1049   GFRAA >60 05/18/2018 1443   GFRAA >60 12/15/2017 1049    No results found for: TOTALPROTELP, ALBUMINELP, A1GS, A2GS, BETS, BETA2SER, GAMS, MSPIKE, SPEI  No results found for: KPAFRELGTCHN, LAMBDASER, KAPLAMBRATIO  Lab Results  Component Value Date  WBC 5.1 05/18/2018   NEUTROABS 3.4 05/18/2018   HGB 11.4 (L) 05/18/2018   HCT 36.1 05/18/2018   MCV 104.6 (H) 05/18/2018   PLT 245 05/18/2018   No results found for: LABCA2  No components found for: [id number]  No results for input(s): INR in the last 168 hours.  No results found for: LABCA2  No results found for: MMN817  No results found for: RNH657  No results found for: XUX833  No results found for: CA2729  No components found for: HGQUANT  No results found for: CEA1 / No results found for: CEA1   No results found for: AFPTUMOR  No results found for: CHROMOGRNA  No results found for: HGBA, HGBA2QUANT, HGBFQUANT, HGBSQUAN (Hemoglobinopathy evaluation)   No results found for: LDH  No results found for: IRON, TIBC, IRONPCTSAT (Iron and TIBC)  No results found for: FERRITIN  Urinalysis    Component Value Date/Time   COLORURINE STRAW (A) 04/12/2018 1426   APPEARANCEUR CLEAR 04/12/2018 1426   LABSPEC 1.003 (L) 04/12/2018 1426   PHURINE 6.0 04/12/2018 1426   GLUCOSEU NEGATIVE 04/12/2018 1426   HGBUR NEGATIVE 04/12/2018 1426   BILIRUBINUR NEGATIVE 04/12/2018 1426   KETONESUR NEGATIVE 04/12/2018 1426   PROTEINUR NEGATIVE 04/12/2018 1426   NITRITE NEGATIVE 04/12/2018 1426   LEUKOCYTESUR NEGATIVE 04/12/2018 1426     STUDIES: MR LIVER W WO CONTRAST  Result Date: 05/02/2019 CLINICAL DATA:  History of breast cancer. EXAM: MRI ABDOMEN WITHOUT AND WITH CONTRAST TECHNIQUE: Multiplanar multisequence MR imaging of the abdomen was performed both before and after the administration of intravenous contrast. CONTRAST:  8.32m  GADAVIST GADOBUTROL 1 MMOL/ML IV SOLN COMPARISON:  CT scan 02/11/2018 FINDINGS: Lower chest: Unremarkable. Hepatobiliary: Evaluation of the liver is motion degraded. 2 cm bilobed lesion is identified in the posterior aspect of segment IV (see image 42 of series 102). When I remeasure the lesion on the previous CT in a similar fashion, the long axis is 1.9 cm. This lesion is hypointense on T1 imaging and essentially isointense on T2 imaging there does appear to be some peripheral enhancement in the lesion after IV contrast administration. This corresponds to the 14 mm abnormality seen on previous CT. Tiny T1 hypointense, T2 hyperintense focus in the inferior tip of the liver noted without substantial enhancement (see image 81/series 102). There is no evidence for gallstones, gallbladder wall thickening, or pericholecystic fluid. No intrahepatic or extrahepatic biliary dilation. Pancreas: No focal mass lesion. No dilatation of the main duct. No intraparenchymal cyst. No peripancreatic edema. Spleen:  No splenomegaly. No focal mass lesion. Adrenals/Urinary Tract: No adrenal nodule or mass. Tiny cortical cysts noted in both kidneys. Stomach/Bowel: Stomach is unremarkable. No gastric wall thickening. No evidence of outlet obstruction. No small bowel or colonic dilatation within the visualized abdomen. Vascular/Lymphatic: No abdominal aortic aneurysm. No abdominal lymphadenopathy. Other:  No intraperitoneal free fluid. Musculoskeletal: Insert no abnormal marrow enhancement within the visualized bony anatomy. IMPRESSION: 1. Motion degraded exam shows 2 cm bilobed lesion in the posterior aspect of segment IV. There may be some peripheral enhancement but motion degradation hinders assessment. Given no appreciable increase in size over more than 1 year, this is likely benign but if there is clinical concern for metastatic disease, PET-CT could be used to further evaluate. Follow-up imaging in 6 months could also be used to  ensure stability. 2. 2nd tiny lesion inferior tip of the right liver also incompletely characterized. Attention on follow-up recommended. Electronically Signed   By: EMisty Stanley  M.D.   On: 05/02/2019 12:18      ELIGIBLE FOR AVAILABLE RESEARCH PROTOCOL: exact sciences study   ASSESSMENT: 79 y.o. [place], [place] woman status post right breast biopsy 07/01/2017 for a clinical mT2 N0, stage IB invasive ductal carcinoma, grade 1, estrogen and progesterone receptor positive, HER-2 nonamplified, with an MIB-105%  (1) a second area in the right breast biopsied 07/01/2017 showed ductal carcinoma in situ, grade 3, estrogen and progesterone receptor positive.  (2) status post right mastectomy and sentinel lymph node sampling 09/24/2017 for a pT1b pN0, stage IA invasive ductal carcinoma, grade 1, with negative margins  (a) a total of 5 sentinel lymph nodes were removed  (b) definitive reconstruction with smooth silicone implant placement [Natrelle Inspira Smooth Round Extra Projection 615 implant placed right chest. REF [id number]. Lon 01/06/2019], with benign pathology; simultaneous left breast reduction   (3) The Oncotype DX score was 28, predicting a risk of outside the breast recurrence over the next 9 years of 17% if the patient's only systemic therapy is tamoxifen for 5 years.  It also predicts a significant benefit from chemotherapy.  (4) cyclophosphamide, methotrexate and fluorouracil (CMF) chemotherapy started 11/17/2017, repeated every 21 days x 8, last treatment 04/12/2018  (a) Udenyca added with cycle 4 and subsequent cycles due to neutropenia  (5) anastrozole started 08/04/2016, interrupted during chemotherapy, resumed FEB 2020  (a) bone density 12/07/2016 at Dr Virgilio Belling showed a T score of - 1.5 to -2.0  (6) genetics testing 07/22/2017 through the Common Hereditary Cancer Panel offered by [facility] found no deleterious mutations in APC, ATM, AXIN2, BARD1, BMPR1A, BRCA1, BRCA2,  BRIP1, CDH1, CDKN2A (p14ARF), CDKN2A (p16INK4a), CKD4, CHEK2, CTNNA1, DICER1, EPCAM (Deletion/duplication testing only), GREM1 (promoter region deletion/duplication testing only), KIT, MEN1, MLH1, MSH2, MSH3, MSH6, MUTYH, NBN, NF1, NHTL1, PALB2, PDGFRA, PMS2, POLD1, POLE, PTEN, RAD50, RAD51C, RAD51D, SDHB, SDHC, SDHD, SMAD4, SMARCA4. STK11, TP53, TSC1, TSC2, and VHL.  The following genes were evaluated for sequence changes only: SDHA and HOXB13 c.251G>A variant only.   (a) a variant of uncertain significance in the gene DICER1 was identified.  c.2116+6G>A (Intronic)  (7) CT A/P done on 02/11/2018 for LLQ pain, no cause for pain identified, but two indeterminate liver lesions were noted  (a) MRI liver 05/02/2019 shows no change in the lesion of concern, with a second very small lesion in the inferior tip of the right liver.   PLAN: Neenah is now a year and a half out from definitive surgery for breast cancer with no evidence of disease recurrence.  This is favorable.  Her sensory duration is of concern but he is improving.  I reassured her that what she is feeling in the evening sounds very much like PVCs and that these are benign.  I showed her how to take her own pulse so she can record the rhythm that she senses if it recurs.  We went over the liver MRI results and she understands by all accounts this appears to be benign but unfortunately there is some movement artifact and we are going to have to do one last liver MRI to make sure this is nothing of concern.  That will be scheduled for late December.  I think she will benefit from a survivorship clinic sometime in June and she is agreeable to that.  With regards to the strange type sensation she has that her breast swell sometimes I suspect this is going to be food related.  I asked her to keep a food diary  and see if she can correlate  Otherwise she will return to see me in 1 year.  She knows to call for any other issue that may develop before  then..  Total encounter time 35 minutes.Sarajane Jews C. Dasani Thurlow, MD  05/04/19 5:12 PM Medical Oncology and Hematology [facility] [address], [place] [address] Tel. [phone]    Fax. [phone]   I, Wilburn Mylar, am acting as scribe for Dr. Virgie Dad. Candon Caras.  I, Lurline Del MD, have reviewed the above documentation for accuracy and completeness, and I agree with the above.   *Total Encounter Time as defined by the Centers for Medicare and Medicaid Services includes, in addition to the face-to-face time of a patient visit (documented in the note above) non-face-to-face time: obtaining and reviewing outside history, ordering and reviewing medications, tests or procedures, care coordination (communications with other health care professionals or caregivers) and documentation in the medical record.

## 2019-05-04 ENCOUNTER — Inpatient Hospital Stay: Payer: Medicare HMO | Attending: Oncology | Admitting: Oncology

## 2019-05-04 ENCOUNTER — Other Ambulatory Visit: Payer: Self-pay

## 2019-05-04 VITALS — BP 124/49 | HR 83 | Temp 97.6°F | Resp 18 | Ht 64.0 in | Wt 177.1 lb

## 2019-05-04 DIAGNOSIS — C50311 Malignant neoplasm of lower-inner quadrant of right female breast: Secondary | ICD-10-CM | POA: Diagnosis not present

## 2019-05-04 DIAGNOSIS — L719 Rosacea, unspecified: Secondary | ICD-10-CM

## 2019-05-04 DIAGNOSIS — Z90710 Acquired absence of both cervix and uterus: Secondary | ICD-10-CM | POA: Diagnosis not present

## 2019-05-04 DIAGNOSIS — M797 Fibromyalgia: Secondary | ICD-10-CM | POA: Diagnosis not present

## 2019-05-04 DIAGNOSIS — Z79899 Other long term (current) drug therapy: Secondary | ICD-10-CM | POA: Diagnosis not present

## 2019-05-04 DIAGNOSIS — Z9221 Personal history of antineoplastic chemotherapy: Secondary | ICD-10-CM | POA: Diagnosis not present

## 2019-05-04 DIAGNOSIS — F419 Anxiety disorder, unspecified: Secondary | ICD-10-CM | POA: Diagnosis not present

## 2019-05-04 DIAGNOSIS — G4730 Sleep apnea, unspecified: Secondary | ICD-10-CM

## 2019-05-04 DIAGNOSIS — Z803 Family history of malignant neoplasm of breast: Secondary | ICD-10-CM | POA: Insufficient documentation

## 2019-05-04 DIAGNOSIS — K769 Liver disease, unspecified: Secondary | ICD-10-CM | POA: Diagnosis not present

## 2019-05-04 DIAGNOSIS — Z87891 Personal history of nicotine dependence: Secondary | ICD-10-CM | POA: Insufficient documentation

## 2019-05-04 DIAGNOSIS — Z8349 Family history of other endocrine, nutritional and metabolic diseases: Secondary | ICD-10-CM | POA: Insufficient documentation

## 2019-05-04 DIAGNOSIS — Z9011 Acquired absence of right breast and nipple: Secondary | ICD-10-CM | POA: Insufficient documentation

## 2019-05-04 DIAGNOSIS — M8580 Other specified disorders of bone density and structure, unspecified site: Secondary | ICD-10-CM | POA: Insufficient documentation

## 2019-05-04 DIAGNOSIS — Z170 Estrogen receptor positive status [ER+]: Secondary | ICD-10-CM

## 2019-05-04 DIAGNOSIS — Z8042 Family history of malignant neoplasm of prostate: Secondary | ICD-10-CM | POA: Diagnosis not present

## 2019-05-05 ENCOUNTER — Telehealth: Payer: Self-pay | Admitting: Oncology

## 2019-05-05 NOTE — Telephone Encounter (Signed)
I talk with patient regarding schedule  

## 2019-05-31 ENCOUNTER — Other Ambulatory Visit: Payer: Self-pay | Admitting: *Deleted

## 2019-05-31 DIAGNOSIS — C50311 Malignant neoplasm of lower-inner quadrant of right female breast: Secondary | ICD-10-CM

## 2019-05-31 DIAGNOSIS — Z170 Estrogen receptor positive status [ER+]: Secondary | ICD-10-CM

## 2019-05-31 MED ORDER — LETROZOLE 2.5 MG PO TABS: 2.5000 mg | ORAL_TABLET | Freq: Every day | ORAL | 1 refills | Status: DC

## 2019-05-31 MED ORDER — TAMOXIFEN CITRATE 20 MG PO TABS: 20.0000 mg | ORAL_TABLET | Freq: Every day | ORAL | 0 refills | Status: DC

## 2019-06-12 ENCOUNTER — Ambulatory Visit: Payer: Medicare HMO | Attending: Internal Medicine

## 2019-06-12 DIAGNOSIS — Z23 Encounter for immunization: Secondary | ICD-10-CM | POA: Insufficient documentation

## 2019-06-12 NOTE — Progress Notes (Signed)
   Covid-19 Vaccination Clinic  Name:  Lauren Holt    MRN: [id number] DOB: 03-03-1941  06/12/2019  Lauren Holt was observed post Covid-19 immunization for 15 minutes without incidence. She was provided with Vaccine Information Sheet and instruction to access the V-Safe system.   Lauren Holt was instructed to call 911 with any severe reactions post vaccine: Marland Kitchen Difficulty breathing  . Swelling of your face and throat  . A fast heartbeat  . A bad rash all over your body  . Dizziness and weakness    Immunizations Administered    Name Date Dose VIS Date Route   Pfizer COVID-19 Vaccine 06/12/2019  8:32 AM 0.3 mL 03/24/2019 Intramuscular   Manufacturer: [place]   Lot: [id number]   [place]: [id number]

## 2019-06-14 IMAGING — MG DIGITAL SCREENING UNILATERAL LEFT MAMMOGRAM WITH CAD AND TOMO
4 series · 4 of 12 positions shown · non-contrast
Comparison: Previous exam(s).

CLINICAL DATA: Screening.

EXAM:
DIGITAL SCREENING UNILATERAL LEFT MAMMOGRAM WITH CAD AND TOMO

[L MLO synth-2D]
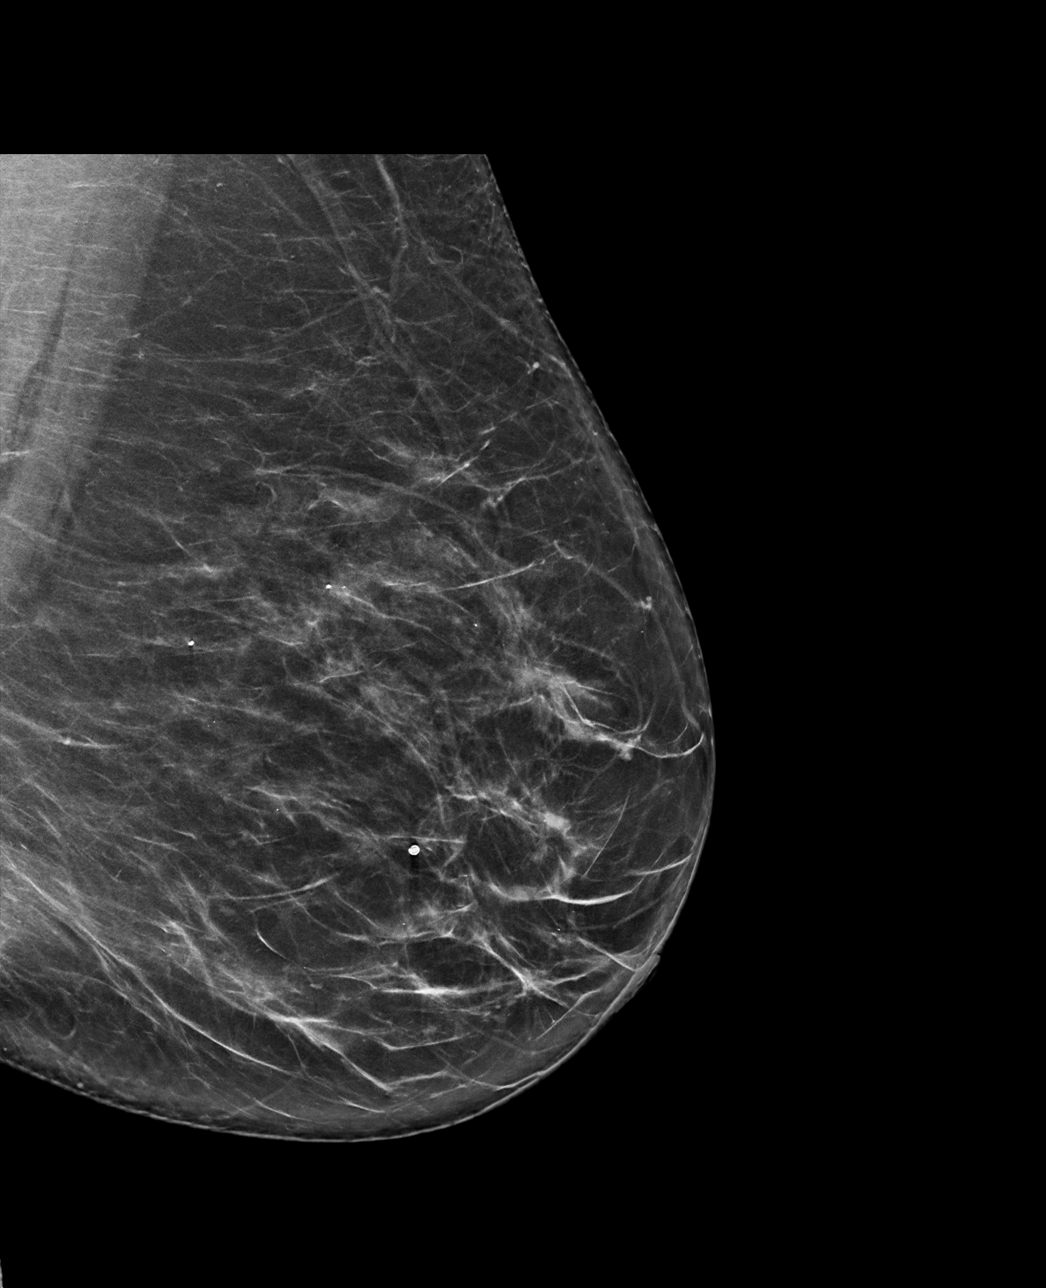

[L CC synth-2D]
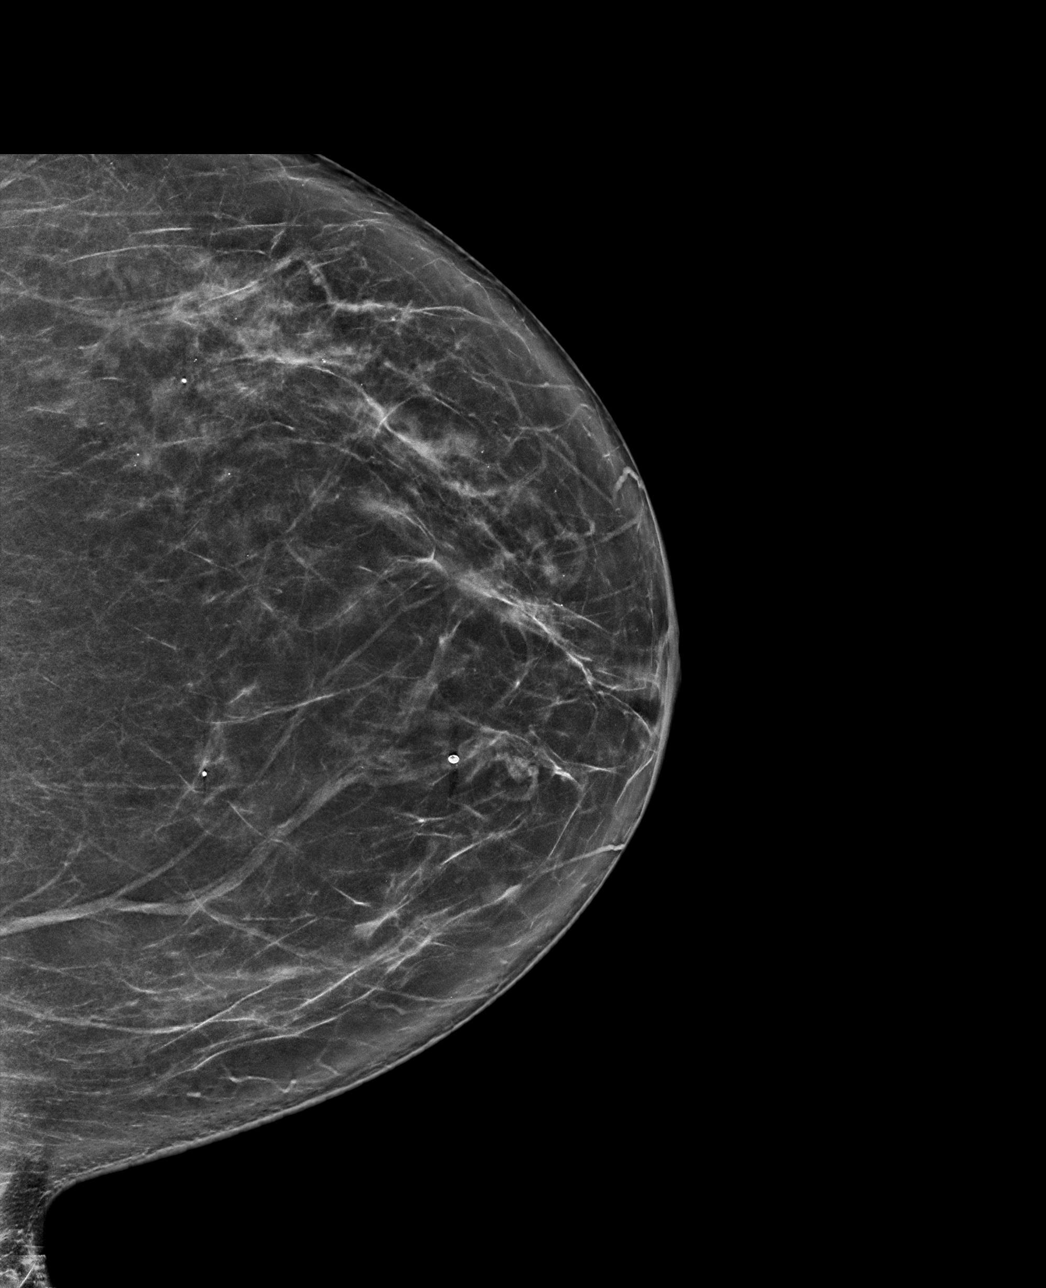

[L CC tomo · tomo slice 41/80.0]
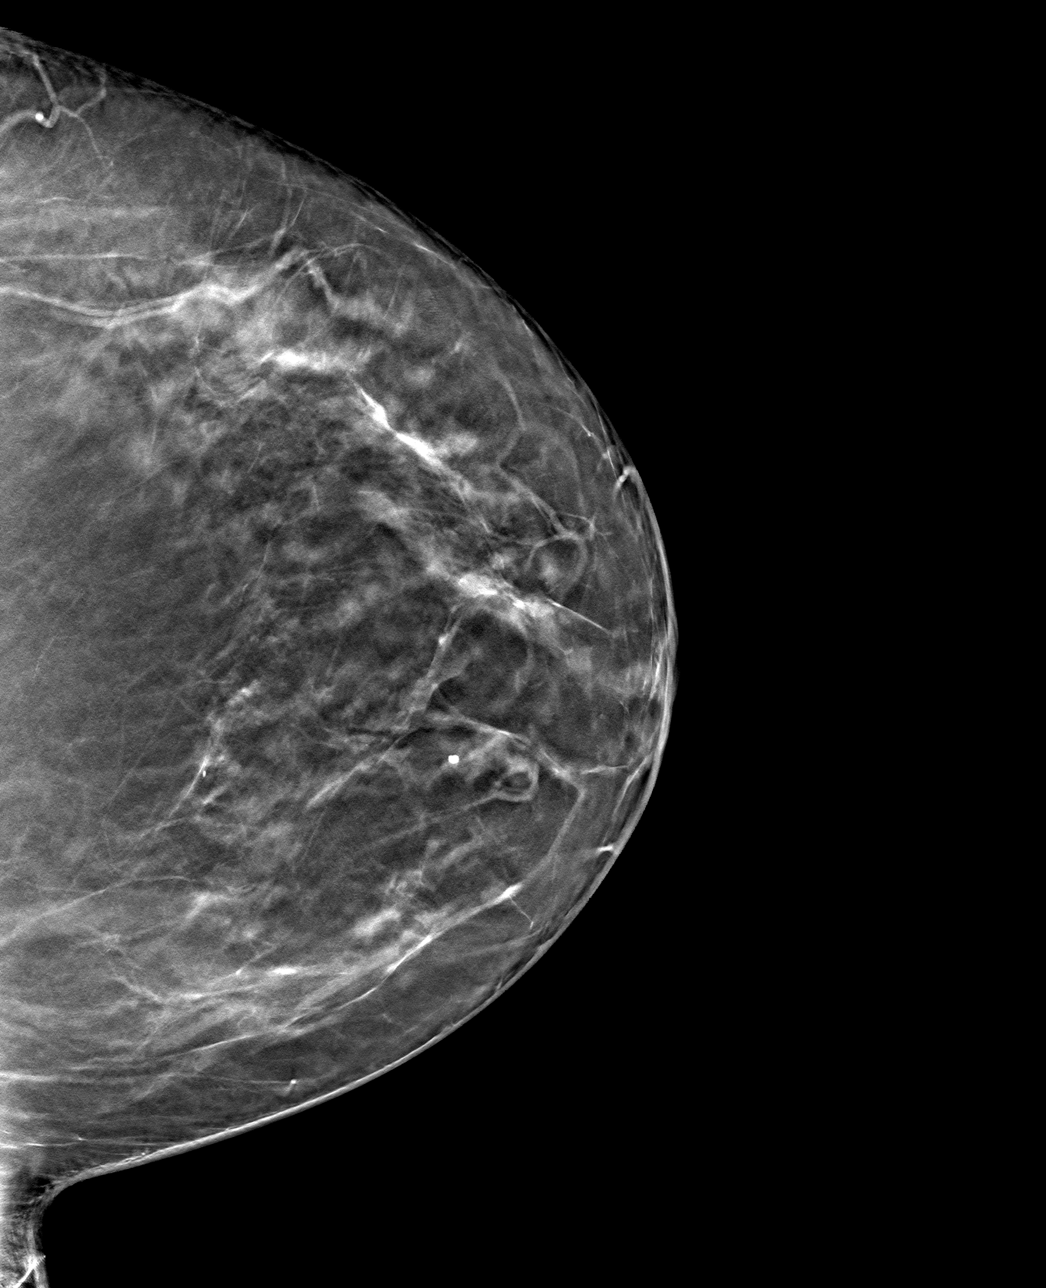

[L MLO tomo · tomo slice 43/84.0]
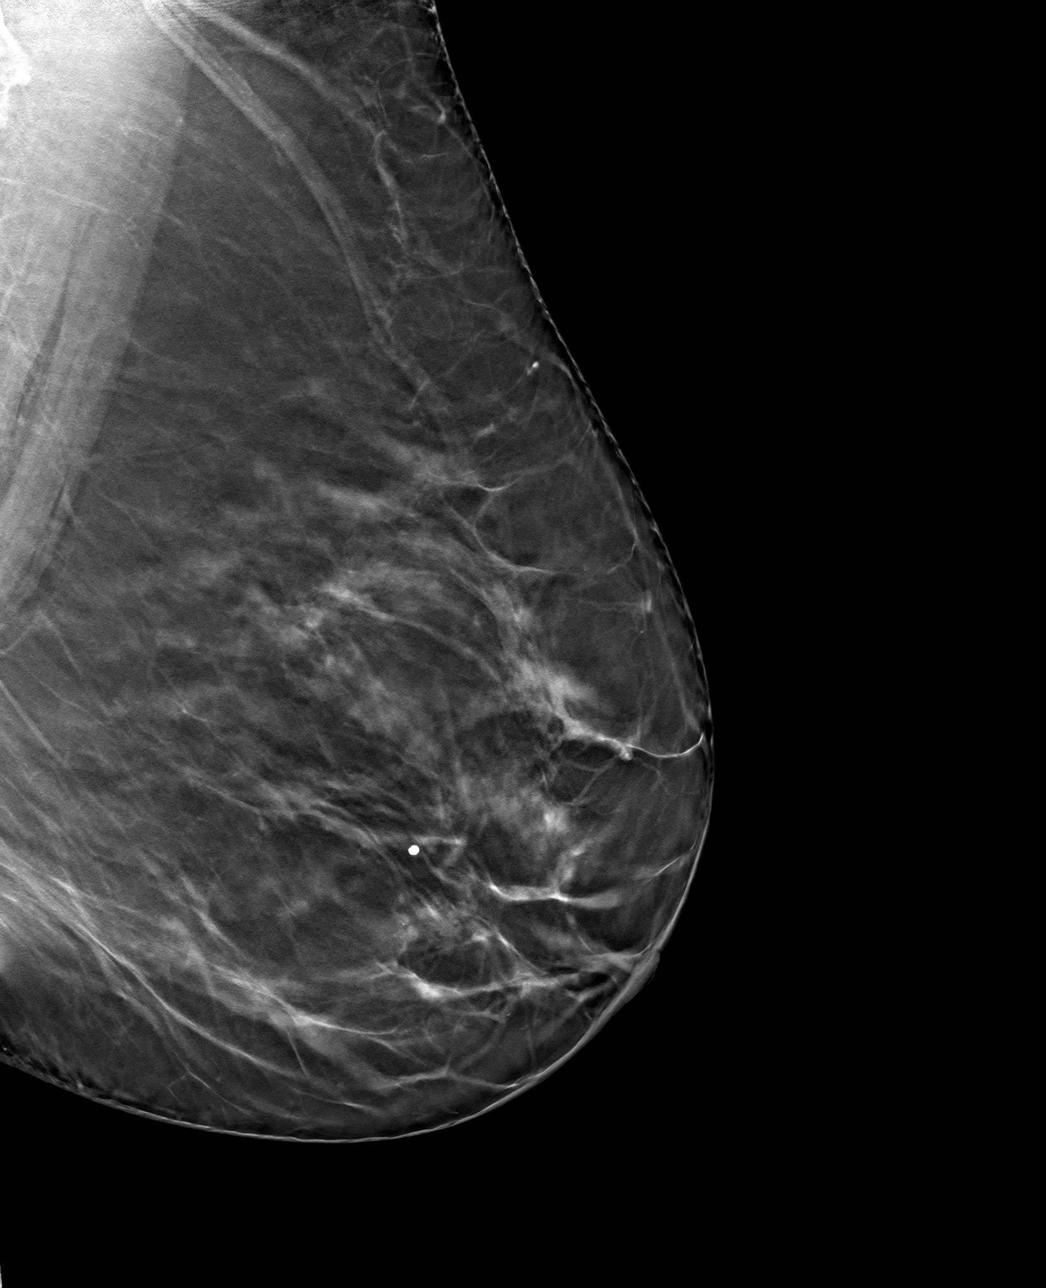

[4 of 12 positions shown; findings below may reference images not displayed]

ACR Breast Density Category b: There are scattered areas of
fibroglandular density.
FINDINGS: The patient has had a right mastectomy. There are no findings
suspicious for malignancy.

Images were processed with CAD.
IMPRESSION: No mammographic evidence of malignancy. A result letter of this
screening mammogram will be mailed directly to the patient.

RECOMMENDATION:
Screening mammogram in one year.  (Code:GY-Q-70Y)

BI-RADS CATEGORY  1: Negative.

## 2019-06-15 DIAGNOSIS — Z961 Presence of intraocular lens: Secondary | ICD-10-CM | POA: Diagnosis not present

## 2019-07-11 ENCOUNTER — Ambulatory Visit: Payer: Medicare HMO | Attending: Internal Medicine

## 2019-07-11 DIAGNOSIS — Z23 Encounter for immunization: Secondary | ICD-10-CM

## 2019-07-11 NOTE — Progress Notes (Signed)
   Covid-19 Vaccination Clinic  Name:  Meela Manalili    MRN: [id number] DOB: 1940/09/23  07/11/2019  Ms. Mckenny was observed post Covid-19 immunization for 15 minutes without incident. She was provided with Vaccine Information Sheet and instruction to access the V-Safe system.   Ms. Smigelski was instructed to call 911 with any severe reactions post vaccine: Marland Kitchen Difficulty breathing  . Swelling of face and throat  . A fast heartbeat  . A bad rash all over body  . Dizziness and weakness   Immunizations Administered    Name Date Dose VIS Date Route   Pfizer COVID-19 Vaccine 07/11/2019 11:17 AM 0.3 mL 03/24/2019 Intramuscular   Manufacturer: [place]   Lot: [id number]   [place]: [id number]

## 2019-08-11 DIAGNOSIS — Z853 Personal history of malignant neoplasm of breast: Secondary | ICD-10-CM | POA: Diagnosis not present

## 2019-08-11 DIAGNOSIS — C50911 Malignant neoplasm of unspecified site of right female breast: Secondary | ICD-10-CM | POA: Diagnosis not present

## 2019-09-13 DIAGNOSIS — W57XXXA Bitten or stung by nonvenomous insect and other nonvenomous arthropods, initial encounter: Secondary | ICD-10-CM | POA: Diagnosis not present

## 2019-09-13 DIAGNOSIS — S30861A Insect bite (nonvenomous) of abdominal wall, initial encounter: Secondary | ICD-10-CM | POA: Diagnosis not present

## 2019-09-13 DIAGNOSIS — S6991XA Unspecified injury of right wrist, hand and finger(s), initial encounter: Secondary | ICD-10-CM | POA: Diagnosis not present

## 2019-09-13 DIAGNOSIS — S6010XA Contusion of unspecified finger with damage to nail, initial encounter: Secondary | ICD-10-CM | POA: Diagnosis not present

## 2019-09-20 ENCOUNTER — Encounter: Payer: Medicare HMO | Admitting: Adult Health

## 2019-11-14 ENCOUNTER — Emergency Department (HOSPITAL_COMMUNITY)
Admission: EM | Admit: 2019-11-14 | Discharge: 2019-11-14 | Disposition: A | Discharge status: Home / Self Care | Payer: Medicare HMO | Attending: Emergency Medicine | Admitting: Emergency Medicine

## 2019-11-14 ENCOUNTER — Emergency Department (HOSPITAL_COMMUNITY): Payer: Medicare HMO

## 2019-11-14 ENCOUNTER — Other Ambulatory Visit: Payer: Self-pay

## 2019-11-14 DIAGNOSIS — Y999 Unspecified external cause status: Secondary | ICD-10-CM | POA: Diagnosis not present

## 2019-11-14 DIAGNOSIS — S3993XA Unspecified injury of pelvis, initial encounter: Secondary | ICD-10-CM | POA: Diagnosis present

## 2019-11-14 DIAGNOSIS — S20212A Contusion of left front wall of thorax, initial encounter: Secondary | ICD-10-CM | POA: Diagnosis not present

## 2019-11-14 DIAGNOSIS — C50911 Malignant neoplasm of unspecified site of right female breast: Secondary | ICD-10-CM | POA: Diagnosis not present

## 2019-11-14 DIAGNOSIS — R079 Chest pain, unspecified: Secondary | ICD-10-CM | POA: Diagnosis not present

## 2019-11-14 DIAGNOSIS — S6991XA Unspecified injury of right wrist, hand and finger(s), initial encounter: Secondary | ICD-10-CM | POA: Diagnosis not present

## 2019-11-14 DIAGNOSIS — Z95828 Presence of other vascular implants and grafts: Secondary | ICD-10-CM | POA: Insufficient documentation

## 2019-11-14 DIAGNOSIS — Z79899 Other long term (current) drug therapy: Secondary | ICD-10-CM | POA: Diagnosis not present

## 2019-11-14 DIAGNOSIS — S39012A Strain of muscle, fascia and tendon of lower back, initial encounter: Secondary | ICD-10-CM | POA: Diagnosis not present

## 2019-11-14 DIAGNOSIS — R0789 Other chest pain: Secondary | ICD-10-CM | POA: Diagnosis not present

## 2019-11-14 DIAGNOSIS — M47816 Spondylosis without myelopathy or radiculopathy, lumbar region: Secondary | ICD-10-CM | POA: Diagnosis not present

## 2019-11-14 DIAGNOSIS — Z87891 Personal history of nicotine dependence: Secondary | ICD-10-CM | POA: Diagnosis not present

## 2019-11-14 DIAGNOSIS — S20211A Contusion of right front wall of thorax, initial encounter: Secondary | ICD-10-CM | POA: Diagnosis not present

## 2019-11-14 DIAGNOSIS — Y939 Activity, unspecified: Secondary | ICD-10-CM | POA: Insufficient documentation

## 2019-11-14 DIAGNOSIS — S20219A Contusion of unspecified front wall of thorax, initial encounter: Secondary | ICD-10-CM | POA: Insufficient documentation

## 2019-11-14 DIAGNOSIS — Y929 Unspecified place or not applicable: Secondary | ICD-10-CM | POA: Diagnosis not present

## 2019-11-14 MED ORDER — ACETAMINOPHEN 500 MG PO TABS
1000.0000 mg | ORAL_TABLET | Freq: Once | ORAL | Status: AC
  Administered 2019-11-14: 1000 mg via ORAL
  Filled 2019-11-14: qty 2

## 2019-11-14 NOTE — Discharge Instructions (Signed)
All of your x-rays are normal with no sign of broken bones.  However you are very bruised and will feel very sore over the next few days.  Make sure you are taking it easy, using Tylenol and ibuprofen for pain and heat also sometimes helps.
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
## 2019-11-14 NOTE — ED Notes (Signed)
Discharge instructions including pain management discussed with pt. Pt verbalized understanding with no questions at this time. Pt to go home with family at bedside

## 2019-11-20 DIAGNOSIS — Z853 Personal history of malignant neoplasm of breast: Secondary | ICD-10-CM | POA: Diagnosis not present

## 2019-11-20 DIAGNOSIS — Z9011 Acquired absence of right breast and nipple: Secondary | ICD-10-CM | POA: Diagnosis not present

## 2019-11-22 DIAGNOSIS — S301XXA Contusion of abdominal wall, initial encounter: Secondary | ICD-10-CM | POA: Diagnosis not present

## 2019-12-08 DIAGNOSIS — N3090 Cystitis, unspecified without hematuria: Secondary | ICD-10-CM | POA: Diagnosis not present

## 2019-12-08 DIAGNOSIS — R350 Frequency of micturition: Secondary | ICD-10-CM | POA: Diagnosis not present

## 2019-12-08 DIAGNOSIS — R198 Other specified symptoms and signs involving the digestive system and abdomen: Secondary | ICD-10-CM | POA: Diagnosis not present

## 2020-01-02 DIAGNOSIS — Z853 Personal history of malignant neoplasm of breast: Secondary | ICD-10-CM | POA: Diagnosis not present

## 2020-01-02 DIAGNOSIS — K5900 Constipation, unspecified: Secondary | ICD-10-CM | POA: Diagnosis not present

## 2020-01-02 DIAGNOSIS — Z23 Encounter for immunization: Secondary | ICD-10-CM | POA: Diagnosis not present

## 2020-01-02 DIAGNOSIS — M8580 Other specified disorders of bone density and structure, unspecified site: Secondary | ICD-10-CM | POA: Diagnosis not present

## 2020-01-02 DIAGNOSIS — L719 Rosacea, unspecified: Secondary | ICD-10-CM | POA: Diagnosis not present

## 2020-01-02 DIAGNOSIS — E7800 Pure hypercholesterolemia, unspecified: Secondary | ICD-10-CM | POA: Diagnosis not present

## 2020-01-02 DIAGNOSIS — F419 Anxiety disorder, unspecified: Secondary | ICD-10-CM | POA: Diagnosis not present

## 2020-01-02 DIAGNOSIS — Z0000 Encounter for general adult medical examination without abnormal findings: Secondary | ICD-10-CM | POA: Diagnosis not present

## 2020-01-11 ENCOUNTER — Other Ambulatory Visit: Payer: Self-pay | Admitting: Family Medicine

## 2020-01-11 DIAGNOSIS — Z1231 Encounter for screening mammogram for malignant neoplasm of breast: Secondary | ICD-10-CM

## 2020-01-30 ENCOUNTER — Ambulatory Visit: Payer: Medicare HMO

## 2020-02-08 DIAGNOSIS — H524 Presbyopia: Secondary | ICD-10-CM | POA: Diagnosis not present

## 2020-02-08 DIAGNOSIS — H52203 Unspecified astigmatism, bilateral: Secondary | ICD-10-CM | POA: Diagnosis not present

## 2020-02-27 ENCOUNTER — Telehealth: Payer: Self-pay

## 2020-02-27 NOTE — Telephone Encounter (Signed)
Pt called and LVM asking when she would need check up. Per Dr Virgie Dad last note pt should not be seen until 04/2020. Called pt and no answer. LVM for pt to return call.

## 2020-03-12 ENCOUNTER — Ambulatory Visit
Admission: RE | Admit: 2020-03-12 | Discharge: 2020-03-12 | Disposition: A | Discharge status: Home / Self Care | Payer: Medicare HMO | Source: Ambulatory Visit | Attending: Family Medicine | Admitting: Family Medicine

## 2020-03-12 ENCOUNTER — Other Ambulatory Visit: Payer: Self-pay | Admitting: Family Medicine

## 2020-03-12 ENCOUNTER — Other Ambulatory Visit: Payer: Self-pay

## 2020-03-12 DIAGNOSIS — Z1231 Encounter for screening mammogram for malignant neoplasm of breast: Secondary | ICD-10-CM | POA: Diagnosis not present

## 2020-03-20 DIAGNOSIS — Z853 Personal history of malignant neoplasm of breast: Secondary | ICD-10-CM | POA: Diagnosis not present

## 2020-03-20 DIAGNOSIS — Z9011 Acquired absence of right breast and nipple: Secondary | ICD-10-CM | POA: Diagnosis not present

## 2020-03-26 ENCOUNTER — Other Ambulatory Visit: Payer: Self-pay

## 2020-03-26 ENCOUNTER — Ambulatory Visit (HOSPITAL_COMMUNITY)
Admission: RE | Admit: 2020-03-26 | Discharge: 2020-03-26 | Disposition: A | Discharge status: Home / Self Care | Payer: Medicare HMO | Source: Ambulatory Visit | Attending: Oncology | Admitting: Oncology

## 2020-03-26 DIAGNOSIS — Z853 Personal history of malignant neoplasm of breast: Secondary | ICD-10-CM | POA: Diagnosis not present

## 2020-03-26 DIAGNOSIS — K7689 Other specified diseases of liver: Secondary | ICD-10-CM | POA: Diagnosis not present

## 2020-03-26 DIAGNOSIS — K769 Liver disease, unspecified: Secondary | ICD-10-CM | POA: Diagnosis not present

## 2020-03-26 MED ORDER — GADOBUTROL 1 MMOL/ML IV SOLN
8.0000 mL | Freq: Once | INTRAVENOUS | Status: AC | PRN
  Administered 2020-03-26: 14:00:00 8 mL via INTRAVENOUS

## 2020-03-27 ENCOUNTER — Encounter: Payer: Self-pay | Admitting: Oncology

## 2020-04-26 ENCOUNTER — Telehealth: Payer: Self-pay | Admitting: Oncology

## 2020-04-26 DIAGNOSIS — Z1152 Encounter for screening for COVID-19: Secondary | ICD-10-CM | POA: Diagnosis not present

## 2020-04-26 DIAGNOSIS — B349 Viral infection, unspecified: Secondary | ICD-10-CM | POA: Diagnosis not present

## 2020-04-26 DIAGNOSIS — J029 Acute pharyngitis, unspecified: Secondary | ICD-10-CM | POA: Diagnosis not present
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
## 2020-04-29 ENCOUNTER — Inpatient Hospital Stay: Payer: Medicare HMO

## 2020-04-29 ENCOUNTER — Inpatient Hospital Stay: Payer: Medicare HMO | Admitting: Oncology

## 2020-05-14 DIAGNOSIS — U071 COVID-19: Secondary | ICD-10-CM | POA: Diagnosis not present

## 2020-05-14 DIAGNOSIS — J029 Acute pharyngitis, unspecified: Secondary | ICD-10-CM | POA: Diagnosis not present

## 2020-05-16 DIAGNOSIS — C50911 Malignant neoplasm of unspecified site of right female breast: Secondary | ICD-10-CM | POA: Diagnosis not present

## 2020-05-16 DIAGNOSIS — Z853 Personal history of malignant neoplasm of breast: Secondary | ICD-10-CM | POA: Diagnosis not present

## 2020-05-29 NOTE — Progress Notes (Signed)
[place]  [phone] [phone]     ID: Lauren Holt DOB: 12/29/1940  MR#: [id number]  XTK#:[id number]  Patient Care Team: Alroy Dust, Carlean Jews.Marlou Sa, MD as PCP - General (Family Medicine) Jovita Kussmaul, MD as Consulting Physician (General Surgery) Mady Oubre, Virgie Dad, MD as Consulting Physician (Oncology) Lauren Rudd, MD as Consulting Physician (Radiation Oncology) OTHER MD:   CHIEF COMPLAINT: Estrogen receptor positive breast cancer (s/p right mastectomy)  CURRENT TREATMENT: Considering tamoxifen  INTERVAL HISTORY: Lauren Holt returns today for follow-up of her estrogen receptor positive breast cancer. She is under observation alone.  Since her last visit, she underwent left screening mammography with tomography at [facility] on 03/12/2020 showing: breast density category B; no evidence of malignancy.   She also underwent liver MRI on 03/26/2020 showing: no significant change in 1.2 cm fluid signal lesion in central right lobe of liver, which appears to demonstrate some peripheral contrast enhancement; given stability for greater than 2 years, this is almost certainly a benign hemangioma.   REVIEW OF SYSTEMS: Lauren Holt went off anastrozole in September 2020 and was started on tamoxifen except today she tells me she actually never started the medication.  Her friend told her that it had too many side effects and she thought it might make her tired or have other problems.  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   COVID 19 VACCINATION STATUS: [place] x2, most recently 06/2019   HISTORY OF CURRENT ILLNESS: On the original intake note:  Lauren Holt had routine screening mammography on 06/14/2017 showing a possible area of calcifications in the right  breast. She underwent unilateral right diagnostic mammography with tomography at [facility] on 06/23/2017 showing: Suspicious calcifications within the inferior right breast, spanning nearly 10 cm, more superior component spanning 4 cm extent. The more superior component may have a small associated mass. Ultrasonography of the right axilla on 07/01/2017 showed normal right axillary lymph nodes.   Accordingly on 06/29/2017 she proceeded to biopsy of the right breast area in question. The pathology from this procedure showed ([id number]): In the right breast lower central, more lateral superior: invasive ductal carcinoma grade I. In the lower central, more medial inferior: Ductal carcinoma in situ, high grade, with calcifications. The  invasive tumor was significant for estrogen receptor, 100% positive and progesterone receptor, 10% positive, both with strong staining intensity. Proliferation marker Ki67 at 5%. HER2  not amplified. The ductal carcinoma in situ was significant for estrogen receptor, 100% positive and progesterone receptor, 80% positive, both with strong staining intensity.   The patient's subsequent history is as detailed below.   PAST MEDICAL HISTORY: Past Medical History:  Diagnosis Date  . Anxiety   . Breast cancer, right breast ([facility])   . Dermatitis   . Family history of adverse reaction to anesthesia    "I think my sister was hard to wake up" (09/24/2017)  . Family history of breast cancer 07/12/2017  . Family history of prostate cancer 07/12/2017  . Genetic testing   . Palpitations    when she lays down, has not seen anyone for this, feels like a flutter   HLD ( but not too high)   PAST SURGICAL HISTORY: Past Surgical History:  Procedure Laterality Date  . BREAST BIOPSY Right 06/2017  .  BREAST RECONSTRUCTION WITH PLACEMENT OF TISSUE EXPANDER AND ALLODERM Right 09/24/2017  . BREAST RECONSTRUCTION WITH PLACEMENT OF TISSUE EXPANDER AND ALLODERM Right 09/24/2017    Procedure: BREAST RECONSTRUCTION WITH PLACEMENT OF TISSUE EXPANDER AND ALLODERM;  Surgeon: Irene Limbo, MD;  Location: [facility];  Service: Plastics;  Laterality: Right;  . BREAST REDUCTION SURGERY Left 01/06/2019   Procedure: LEFT REDUCTION  (BREAST);  Surgeon: Irene Limbo, MD;  Location: [facility];  Service: Plastics;  Laterality: Left;  . CARPAL TUNNEL RELEASE Bilateral   . COLONOSCOPY    . MASTECTOMY COMPLETE / SIMPLE W/ SENTINEL NODE BIOPSY Right 09/24/2017  . MASTECTOMY W/ SENTINEL NODE BIOPSY Right 09/24/2017   Procedure: MASTECTOMY WITH SENTINEL LYMPH NODE BIOPSY;  Surgeon: Jovita Kussmaul, MD;  Location: [facility];  Service: General;  Laterality: Right;  . PORT-A-CATH REMOVAL Left 01/06/2019   Procedure: REMOVAL LEFT CHEST PORT;  Surgeon: Irene Limbo, MD;  Location: [facility];  Service: Plastics;  Laterality: Left;  . PORTACATH PLACEMENT Left 11/08/2017   Procedure: INSERTION PORT-A-CATH;  Surgeon: Jovita Kussmaul, MD;  Location: [facility];  Service: General;  Laterality: Left;  . REDUCTION MAMMAPLASTY Left   . REMOVAL OF TISSUE EXPANDER AND PLACEMENT OF IMPLANT Right 01/06/2019   Procedure: REMOVAL OF RIGHT TISSUE EXPANDER AND PLACEMENT OF IMPLANT;  Surgeon: Irene Limbo, MD;  Location: [facility];  Service: Plastics;  Laterality: Right;  . TUBAL LIGATION    . VAGINAL HYSTERECTOMY     Partial Hysterectomy without BSO. Because they were too close to the bladder.    FAMILY HISTORY Family History  Problem Relation Age of Onset  . Breast cancer Paternal Aunt   . Breast cancer Cousin 55  . Stroke Mother        caused blindness  . Prostate cancer Father 42  . Breast cancer Cousin 82  . Breast cancer Cousin 25  . Breast cancer Cousin 72  . Breast cancer Other    The patient's father died at age 45 due to emphysema. The patient's mother is alive at age 35, turning 96 March 2020. The patient has 5 brothers and 3 sisters. There was a  paternal great aunt with breast cancer. There was also a paternal cousin with breast cancer.    GYNECOLOGIC HISTORY:  No LMP recorded. Patient has had a hysterectomy. Menarche: 80 years old Age at first live birth: 80 years old The patient is GXP3. She is s/p partial hysterectomy. Both of her ovaries remain. She used oral contraceptives with no complications. She also used HRT for 2 years without complications.     SOCIAL HISTORY:  Sefora is now retired. 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 She is divorced. Her oldest daughter, Lauren Holt", is a [employer]th Land in [place], [place]. The patient's son, Lauren Holt, works in a Proofreader in [place]. The patient's son, Lauren Holt, works in Quarry manager in [place].  The patient has 4 grandchildren. She currently does not belong to a church.   ADVANCED DIRECTIVES: Not in place.  At the 07/07/2017 visit the patient was given the appropriate documents to complete and notarized at her discretion.   HEALTH MAINTENANCE: Social History   Tobacco Use  . Smoking status: Former Smoker    Years: 1.00    Types: Cigarettes  . Smokeless tobacco: Never Used  . Tobacco comment: Smoked X 1 year at age 57 (less than ppd)  Vaping Use  .  Vaping Use: Never used  Substance Use Topics  . Alcohol use: Yes    Comment: social  . Drug use: Never     Colonoscopy: 02/2017 at [facility]  PAP:   Bone density: 2018/ osteopenia   No Known Allergies  Current Outpatient Medications  Medication Sig Dispense Refill  . ALPRAZolam (XANAX) 0.25 MG tablet Take 0.125 mg by mouth 2 (two) times daily as needed for anxiety.     Marland Kitchen BETA CAROTENE PO Take 7,500 mcg by mouth daily.    . calcium carbonate (CALCIUM 600) 600 MG TABS tablet Take 600 mg by mouth daily.    . Cholecalciferol (VITAMIN D-3) 5000 units TABS Take 1 tablet by mouth daily.    . Lutein 20 MG CAPS Take 20 mg by mouth daily.     . Multiple  Vitamins-Minerals (ICAPS AREDS FORMULA PO) Take 1 capsule by mouth 3 (three) times a week.     . Probiotic Product (PROBIOTIC DAILY PO) Take 1 capsule by mouth 3 (three) times a week.     . Red Yeast Rice 600 MG CAPS Take 600 mg by mouth daily.     . tamoxifen (NOLVADEX) 20 MG tablet Take 1 tablet (20 mg total) by mouth daily. 90 tablet 0  . VITAMIN E PO Take 180 mg by mouth daily.    Marland Kitchen zinc gluconate 50 MG tablet Take 50 mg by mouth daily as needed (cold prevention).     No current facility-administered medications for this visit.    OBJECTIVE: White woman who appears younger than stated age  30:   05/30/20 1309  BP: (!) 135/55  Pulse: 87  Resp: 18  Temp: (!) 97.2 F (36.2 C)  SpO2: 99%     Body mass index is 32.21 kg/m.   Wt Readings from Last 3 Encounters:  05/30/20 176 lb 1.6 oz (79.9 kg)  11/14/19 175 lb (79.4 kg)  05/04/19 177 lb 1.6 oz (80.3 kg)  ECOG FS:1 - Symptomatic but completely ambulatory  Sclerae unicteric, EOMs intact Wearing a mask No cervical or supraclavicular adenopathy Lungs no rales or rhonchi Heart regular rate and rhythm Abd soft, nontender, positive bowel sounds MSK no focal spinal tenderness, no upper extremity lymphedema Neuro: nonfocal, well oriented, appropriate affect Breasts: The right breast is status post mastectomy and reconstruction.  There is no evidence of local recurrence.  The left breast is status post reduction mammoplasty.  There are no findings of concern.  Both axillae are benign.   LAB RESULTS:  CMP     Component Value Date/Time   NA 137 05/30/2020 1235   K 4.4 05/30/2020 1235   CL 102 05/30/2020 1235   CO2 28 05/30/2020 1235   GLUCOSE 120 (H) 05/30/2020 1235   BUN 12 05/30/2020 1235   CREATININE 0.79 05/30/2020 1235   CREATININE 0.71 12/15/2017 1049   CALCIUM 9.6 05/30/2020 1235   PROT 7.1 05/30/2020 1235   ALBUMIN 4.0 05/30/2020 1235   AST 20 05/30/2020 1235   AST 15 12/15/2017 1049   ALT 18 05/30/2020 1235    ALT 13 12/15/2017 1049   ALKPHOS 75 05/30/2020 1235   BILITOT 0.4 05/30/2020 1235   BILITOT 0.4 12/15/2017 1049   GFRNONAA >60 05/30/2020 1235   GFRNONAA >60 12/15/2017 1049   GFRAA >60 05/18/2018 1443   GFRAA >60 12/15/2017 1049    No results found for: TOTALPROTELP, ALBUMINELP, A1GS, A2GS, BETS, BETA2SER, GAMS, MSPIKE, SPEI  No results found for: KPAFRELGTCHN, LAMBDASER, KAPLAMBRATIO  Lab  Results  Component Value Date   WBC 5.5 05/30/2020   NEUTROABS 3.4 05/30/2020   HGB 13.2 05/30/2020   HCT 40.7 05/30/2020   MCV 97.8 05/30/2020   PLT 259 05/30/2020   No results found for: LABCA2  No components found for: WGNFAO130  No results for input(s): INR in the last 168 hours.  No results found for: LABCA2  No results found for: QMV784  No results found for: ONG295  No results found for: MWU132  No results found for: CA2729  No components found for: HGQUANT  No results found for: CEA1 / No results found for: CEA1   No results found for: AFPTUMOR  No results found for: CHROMOGRNA  No results found for: HGBA, HGBA2QUANT, HGBFQUANT, HGBSQUAN (Hemoglobinopathy evaluation)   No results found for: LDH  No results found for: IRON, TIBC, IRONPCTSAT (Iron and TIBC)  No results found for: FERRITIN  Urinalysis    Component Value Date/Time   COLORURINE STRAW (A) 04/12/2018 1426   APPEARANCEUR CLEAR 04/12/2018 1426   LABSPEC 1.003 (L) 04/12/2018 1426   PHURINE 6.0 04/12/2018 1426   GLUCOSEU NEGATIVE 04/12/2018 1426   HGBUR NEGATIVE 04/12/2018 1426   BILIRUBINUR NEGATIVE 04/12/2018 1426   KETONESUR NEGATIVE 04/12/2018 1426   PROTEINUR NEGATIVE 04/12/2018 1426   NITRITE NEGATIVE 04/12/2018 1426   LEUKOCYTESUR NEGATIVE 04/12/2018 1426    STUDIES: No results found.    ELIGIBLE FOR AVAILABLE RESEARCH PROTOCOL: exact sciences study   ASSESSMENT: 80 y.o. [place], [place] woman status post right breast biopsy 07/01/2017 for a clinical mT2 N0, stage IB invasive ductal  carcinoma, grade 1, estrogen and progesterone receptor positive, HER-2 nonamplified, with an MIB-105%  (1) a second area in the right breast biopsied 07/01/2017 showed ductal carcinoma in situ, grade 3, estrogen and progesterone receptor positive.  (2) status post right mastectomy and sentinel lymph node sampling 09/24/2017 for a pT1b pN0, stage IA invasive ductal carcinoma, grade 1, with negative margins  (a) a total of 5 sentinel lymph nodes were removed  (b) definitive reconstruction with smooth silicone implant placement [Natrelle Inspira Smooth Round Extra Projection 615 implant placed right chest. REF GMW-102 SN [id number]. Lon 01/06/2019], with benign pathology; simultaneous left breast reduction   (3) The Oncotype DX score was 28, predicting a risk of outside the breast recurrence over the next 9 years of 17% if the patient's only systemic therapy is tamoxifen for 5 years.  It also predicts a significant benefit from chemotherapy.  (4) cyclophosphamide, methotrexate and fluorouracil (CMF) chemotherapy started 11/17/2017, repeated every 21 days x 8, last treatment 04/12/2018  (a) Udenyca added with cycle 4 and subsequent cycles due to neutropenia  (5) anastrozole started 08/04/2016, interrupted during chemotherapy, resumed FEB 2020, discontinued September 2020  (a) bone density 12/07/2016 at Dr Virgilio Belling showed a T score of - 1.5 to -2.0  (b) tamoxifen prescribed February 2021, never started by the patient  (6) genetics testing 07/22/2017 through the Common Hereditary Cancer Panel offered by [facility] found no deleterious mutations in APC, ATM, AXIN2, BARD1, BMPR1A, BRCA1, BRCA2, BRIP1, CDH1, CDKN2A (p14ARF), CDKN2A (p16INK4a), CKD4, CHEK2, CTNNA1, DICER1, EPCAM (Deletion/duplication testing only), GREM1 (promoter region deletion/duplication testing only), KIT, MEN1, MLH1, MSH2, MSH3, MSH6, MUTYH, NBN, NF1, NHTL1, PALB2, PDGFRA, PMS2, POLD1, POLE, PTEN, RAD50, RAD51C, RAD51D, SDHB, SDHC, SDHD,  SMAD4, SMARCA4. STK11, TP53, TSC1, TSC2, and VHL.  The following genes were evaluated for sequence changes only: SDHA and HOXB13 c.251G>A variant only.   (a) a variant of uncertain significance in the  gene DICER1 was identified.  c.2116+6G>A (Intronic)  (7) CT A/P done on 02/11/2018 for LLQ pain, no cause for pain identified, but two indeterminate liver lesions were noted  (a) MRI liver 05/02/2019 shows no change in the lesion of concern, with a second very small lesion in the inferior tip of the right liver.  (b) repeat liver MRI 03/27/2020 stable and consistent with a benign hemangioma   PLAN: Lauren Holt is now close to 3 years out from definitive surgery for her breast cancer with no evidence of disease recurrence.  This is very favorable.  I think overall her prognosis at this point is good and her risk of developing stage IV disease from her earlier cancer is likely in the 12% range.  She states struck up her excursions and gave the tamoxifen a try and was able to tolerated it would cut that risk in half and also the risk of her developing a new breast cancer.  We reviewed the possible side effects toxicities and complications of tamoxifen.  She thinks she is ready to give it a try.  I asked her in any case to call us in 4 weeks and gave her the phone number.  She needs to tell us if she is going to take it is not going to take it or if she is taking it but is having some side effects that we could intervene with.  I also reviewed the MRI of her liver with her.  This was very reassuring and no further liver MRIs are needed for follow-up of her hemangioma  She will have her next mammography early December and see me shortly after that  Total encounter time 30 minutes.Sarajane Jews C. Qasim Diveley, MD  05/30/20 1:56 PM Medical Oncology and Hematology [facility] [address] [place], [place] [address] Tel. [phone]    Fax. [phone]   I, Wilburn Mylar, am acting as  scribe for Dr. Virgie Dad. Tapanga Ottaway.  I, Lurline Del MD, have reviewed the above documentation for accuracy and completeness, and I agree with the above.   *Total Encounter Time as defined by the Centers for Medicare and Medicaid Services includes, in addition to the face-to-face time of a patient visit (documented in the note above) non-face-to-face time: obtaining and reviewing outside history, ordering and reviewing medications, tests or procedures, care coordination (communications with other health care professionals or caregivers) and documentation in the medical record.

## 2020-05-30 ENCOUNTER — Inpatient Hospital Stay: Payer: Medicare HMO | Attending: Adult Health

## 2020-05-30 ENCOUNTER — Inpatient Hospital Stay: Payer: Medicare HMO | Admitting: Oncology

## 2020-05-30 ENCOUNTER — Other Ambulatory Visit: Payer: Self-pay

## 2020-05-30 VITALS — BP 135/55 | HR 87 | Temp 97.2°F | Resp 18 | Ht 62.0 in | Wt 176.1 lb

## 2020-05-30 DIAGNOSIS — Z8349 Family history of other endocrine, nutritional and metabolic diseases: Secondary | ICD-10-CM | POA: Insufficient documentation

## 2020-05-30 DIAGNOSIS — F419 Anxiety disorder, unspecified: Secondary | ICD-10-CM | POA: Diagnosis not present

## 2020-05-30 DIAGNOSIS — Z8042 Family history of malignant neoplasm of prostate: Secondary | ICD-10-CM | POA: Diagnosis not present

## 2020-05-30 DIAGNOSIS — M8580 Other specified disorders of bone density and structure, unspecified site: Secondary | ICD-10-CM | POA: Insufficient documentation

## 2020-05-30 DIAGNOSIS — Z79899 Other long term (current) drug therapy: Secondary | ICD-10-CM | POA: Diagnosis not present

## 2020-05-30 DIAGNOSIS — Z9011 Acquired absence of right breast and nipple: Secondary | ICD-10-CM | POA: Insufficient documentation

## 2020-05-30 DIAGNOSIS — Z9221 Personal history of antineoplastic chemotherapy: Secondary | ICD-10-CM | POA: Insufficient documentation

## 2020-05-30 DIAGNOSIS — Z79810 Long term (current) use of selective estrogen receptor modulators (SERMs): Secondary | ICD-10-CM | POA: Insufficient documentation

## 2020-05-30 DIAGNOSIS — C50311 Malignant neoplasm of lower-inner quadrant of right female breast: Secondary | ICD-10-CM

## 2020-05-30 DIAGNOSIS — Z90710 Acquired absence of both cervix and uterus: Secondary | ICD-10-CM | POA: Diagnosis not present

## 2020-05-30 DIAGNOSIS — Z803 Family history of malignant neoplasm of breast: Secondary | ICD-10-CM | POA: Diagnosis not present

## 2020-05-30 DIAGNOSIS — Z87891 Personal history of nicotine dependence: Secondary | ICD-10-CM | POA: Insufficient documentation

## 2020-05-30 DIAGNOSIS — Z170 Estrogen receptor positive status [ER+]: Secondary | ICD-10-CM | POA: Insufficient documentation

## 2020-05-30 LAB — CBC WITH DIFFERENTIAL/PLATELET
Abs Immature Granulocytes: 0.01 10*3/uL (ref 0.00–0.07)
Basophils Absolute: 0 10*3/uL (ref 0.0–0.1)
Basophils Relative: 0 %
Eosinophils Absolute: 0 10*3/uL (ref 0.0–0.5)
Eosinophils Relative: 1 %
HCT: 40.7 % (ref 36.0–46.0)
Hemoglobin: 13.2 g/dL (ref 12.0–15.0)
Immature Granulocytes: 0 %
Lymphocytes Relative: 30 %
Lymphs Abs: 1.7 10*3/uL (ref 0.7–4.0)
MCH: 31.7 pg (ref 26.0–34.0)
MCHC: 32.4 g/dL (ref 30.0–36.0)
MCV: 97.8 fL (ref 80.0–100.0)
Monocytes Absolute: 0.4 10*3/uL (ref 0.1–1.0)
Monocytes Relative: 7 %
Neutro Abs: 3.4 10*3/uL (ref 1.7–7.7)
Neutrophils Relative %: 62 %
Platelets: 259 10*3/uL (ref 150–400)
RBC: 4.16 MIL/uL (ref 3.87–5.11)
RDW: 12.6 % (ref 11.5–15.5)
WBC: 5.5 10*3/uL (ref 4.0–10.5)
nRBC: 0 % (ref 0.0–0.2)

## 2020-05-30 LAB — COMPREHENSIVE METABOLIC PANEL
ALT: 18 U/L (ref 0–44)
AST: 20 U/L (ref 15–41)
Albumin: 4 g/dL (ref 3.5–5.0)
Alkaline Phosphatase: 75 U/L (ref 38–126)
Anion gap: 7 (ref 5–15)
BUN: 12 mg/dL (ref 8–23)
CO2: 28 mmol/L (ref 22–32)
Calcium: 9.6 mg/dL (ref 8.9–10.3)
Chloride: 102 mmol/L (ref 98–111)
Creatinine, Ser: 0.79 mg/dL (ref 0.44–1.00)
GFR, Estimated: >60 mL/min (ref >60)
Glucose, Bld: 120 mg/dL — ABNORMAL HIGH (ref 70–99)
Potassium: 4.4 mmol/L (ref 3.5–5.1)
Sodium: 137 mmol/L (ref 135–145)
Total Bilirubin: 0.4 mg/dL (ref 0.3–1.2)
Total Protein: 7.1 g/dL (ref 6.5–8.1)

## 2020-05-31 ENCOUNTER — Telehealth: Payer: Self-pay | Admitting: Oncology

## 2020-05-31 NOTE — Telephone Encounter (Signed)
No 2/17 los. No changes made to pt's schedule.

## 2020-06-28 DIAGNOSIS — N760 Acute vaginitis: Secondary | ICD-10-CM | POA: Diagnosis not present

## 2020-07-03 DIAGNOSIS — N764 Abscess of vulva: Secondary | ICD-10-CM | POA: Diagnosis not present

## 2020-07-03 DIAGNOSIS — L738 Other specified follicular disorders: Secondary | ICD-10-CM | POA: Diagnosis not present

## 2020-07-03 DIAGNOSIS — N390 Urinary tract infection, site not specified: Secondary | ICD-10-CM | POA: Diagnosis not present

## 2020-07-03 DIAGNOSIS — L0291 Cutaneous abscess, unspecified: Secondary | ICD-10-CM | POA: Diagnosis not present

## 2020-07-11 ENCOUNTER — Telehealth: Payer: Self-pay | Admitting: *Deleted

## 2020-07-11 NOTE — Telephone Encounter (Signed)
Lauren Holt states she was very red and swollen around her vagina, has a staph infection. Took antibiotics and steroids. Went to Emergency care in [place] 07/03/20. Has completed antibiotics and steroids. Soreness comes and goes. Is still red.   Started Tamoxifen ~ 4 weeks ago. Wants to know if that could be the cause.   She saw PCP prior to trip to [place]- vagina was just red. Dr Brigitte Pulse said she could refer to a Gyn if needed. Pt is going to call Dr Raul Del office to let her know what has transpired.

## 2020-07-11 NOTE — Telephone Encounter (Signed)
LM stating Dr Jana Hakim does not think this was caused by the tamoxifen. But wants her to stop taking for 2 weeks or until the problem is completely resolved, then give [place] a call.

## 2020-07-16 DIAGNOSIS — N764 Abscess of vulva: Secondary | ICD-10-CM | POA: Diagnosis not present

## 2020-07-24 DIAGNOSIS — N764 Abscess of vulva: Secondary | ICD-10-CM | POA: Diagnosis not present

## 2020-08-01 DIAGNOSIS — N764 Abscess of vulva: Secondary | ICD-10-CM | POA: Diagnosis not present

## 2020-08-21 DIAGNOSIS — L989 Disorder of the skin and subcutaneous tissue, unspecified: Secondary | ICD-10-CM | POA: Diagnosis not present

## 2020-08-21 DIAGNOSIS — N764 Abscess of vulva: Secondary | ICD-10-CM | POA: Diagnosis not present

## 2020-09-25 DIAGNOSIS — L309 Dermatitis, unspecified: Secondary | ICD-10-CM | POA: Diagnosis not present

## 2020-09-25 DIAGNOSIS — R252 Cramp and spasm: Secondary | ICD-10-CM | POA: Diagnosis not present

## 2020-09-25 DIAGNOSIS — M25551 Pain in right hip: Secondary | ICD-10-CM | POA: Diagnosis not present

## 2020-10-11 DIAGNOSIS — N764 Abscess of vulva: Secondary | ICD-10-CM | POA: Diagnosis not present

## 2020-10-17 ENCOUNTER — Other Ambulatory Visit: Payer: Self-pay | Admitting: *Deleted

## 2020-10-17 MED ORDER — TAMOXIFEN CITRATE 20 MG PO TABS
20.0000 mg | ORAL_TABLET | Freq: Every day | ORAL | 3 refills | Status: DC
Start: 2020-10-17 — End: 2021-04-01

## 2020-10-18 DIAGNOSIS — N764 Abscess of vulva: Secondary | ICD-10-CM | POA: Diagnosis not present

## 2021-02-13 DIAGNOSIS — H5213 Myopia, bilateral: Secondary | ICD-10-CM | POA: Diagnosis not present

## 2021-02-13 DIAGNOSIS — Z961 Presence of intraocular lens: Secondary | ICD-10-CM | POA: Diagnosis not present

## 2021-02-13 DIAGNOSIS — H524 Presbyopia: Secondary | ICD-10-CM | POA: Diagnosis not present

## 2021-02-13 DIAGNOSIS — H52203 Unspecified astigmatism, bilateral: Secondary | ICD-10-CM | POA: Diagnosis not present

## 2021-02-18 ENCOUNTER — Telehealth: Payer: Self-pay | Admitting: Oncology

## 2021-02-18 NOTE — Telephone Encounter (Signed)
Scheduled per sch msg. Called, not able to leave msg. Mailed printout  °

## 2021-03-11 DIAGNOSIS — N898 Other specified noninflammatory disorders of vagina: Secondary | ICD-10-CM | POA: Diagnosis not present

## 2021-03-11 DIAGNOSIS — N9089 Other specified noninflammatory disorders of vulva and perineum: Secondary | ICD-10-CM | POA: Diagnosis not present

## 2021-03-11 DIAGNOSIS — N816 Rectocele: Secondary | ICD-10-CM | POA: Diagnosis not present

## 2021-03-11 DIAGNOSIS — N907 Vulvar cyst: Secondary | ICD-10-CM | POA: Diagnosis not present

## 2021-03-21 DIAGNOSIS — L0390 Cellulitis, unspecified: Secondary | ICD-10-CM | POA: Diagnosis not present

## 2021-03-31 NOTE — Progress Notes (Signed)
Lauren Holt  [phone] [phone]     ID: Lauren Holt DOB: Apr 01, 1941  MR#: [id number]  NAT#:[id number]  Patient Care Team: Lauren Holt, Lauren Jews.Lauren Sa, Lauren Holt as PCP - General (Family Medicine) Lauren Kussmaul, Lauren Holt as Consulting Physician (General Surgery) Lauren Holt, Lauren Dad, Lauren Holt as Consulting Physician (Oncology) Kyung Rudd, Lauren Holt as Consulting Physician (Radiation Oncology) OTHER Lauren Holt:   CHIEF COMPLAINT: Estrogen receptor positive breast cancer (s/p right mastectomy)  CURRENT TREATMENT: tamoxifen   INTERVAL HISTORY: Lauren Holt returns today for follow-up of her estrogen receptor positive breast cancer.   She began tamoxifen around 06/11/2020. She developed a vaginal staph infection and stopped the tamoxifen per telephone encounter 07/11/2020. She later resumed it February 2022 and is now tolerating it without significant side effects-- specifically hot flashes are not a concern.  She is overdue for left mammography, last on 03/12/2020.   REVIEW OF SYSTEMS: Lauren Holt does have some urinary issues, and tells me she has a rectal prolapse.  Recall she is status post hysterectomy and bladder tack up.  She is being referred by her gynecologist for physical therapy regarding this.  She uses Neosporin to moist 10 the labia and has developed MRSA infections perhaps as a result of that.  For exercise she walks with a friend a couple of times a week perhaps 2 miles at a time.  Aside from these issues a detailed review of systems today was stable   COVID 19 VACCINATION STATUS: Pfizer x3, also had COVID January 2022   HISTORY OF CURRENT ILLNESS: On the original intake note:  Lauren Holt had routine screening mammography on 06/14/2017 showing a possible area of calcifications in the right breast. She underwent unilateral right diagnostic mammography with tomography at [facility] on 06/23/2017 showing: Suspicious calcifications within the inferior right breast,  spanning nearly 10 cm, more superior component spanning 4 cm extent. The more superior component may have a small associated mass. Ultrasonography of the right axilla on 07/01/2017 showed normal right axillary lymph nodes.   Accordingly on 06/29/2017 she proceeded to biopsy of the right breast area in question. The pathology from this procedure showed ([id number]): In the right breast lower central, more lateral superior: invasive ductal carcinoma grade I. In the lower central, more medial inferior: Ductal carcinoma in situ, high grade, with calcifications. The  invasive tumor was significant for estrogen receptor, 100% positive and progesterone receptor, 10% positive, both with strong staining intensity. Proliferation marker Ki67 at 5%. HER2  not amplified. The ductal carcinoma in situ was significant for estrogen receptor, 100% positive and progesterone receptor, 80% positive, both with strong staining intensity.   The patient's subsequent history is as detailed below.   PAST MEDICAL HISTORY: Past Medical History:  Diagnosis Date   Anxiety    Breast cancer, right breast ([place])    Dermatitis    Family history of adverse reaction to anesthesia    "I think my sister was hard to wake up" (09/24/2017)   Family history of breast cancer 07/12/2017   Family history of prostate cancer 07/12/2017   Genetic testing    Palpitations    when she lays down, has not seen anyone for this, feels like a flutter   HLD ( but not too high)   PAST SURGICAL HISTORY: Past Surgical History:  Procedure Laterality Date   BREAST BIOPSY Right 06/2017   BREAST RECONSTRUCTION WITH PLACEMENT OF TISSUE EXPANDER AND ALLODERM Right 09/24/2017   BREAST RECONSTRUCTION WITH PLACEMENT OF TISSUE EXPANDER AND ALLODERM Right 09/24/2017  Procedure: BREAST RECONSTRUCTION WITH PLACEMENT OF TISSUE EXPANDER AND ALLODERM;  Surgeon: Irene Limbo, Lauren Holt;  Location: [facility];  Service: Plastics;  Laterality: Right;   BREAST REDUCTION SURGERY  Left 01/06/2019   Procedure: LEFT REDUCTION  (BREAST);  Surgeon: Irene Limbo, Lauren Holt;  Location: [facility];  Service: Plastics;  Laterality: Left;   CARPAL TUNNEL RELEASE Bilateral    COLONOSCOPY     MASTECTOMY COMPLETE / SIMPLE W/ SENTINEL NODE BIOPSY Right 09/24/2017   MASTECTOMY W/ SENTINEL NODE BIOPSY Right 09/24/2017   Procedure: MASTECTOMY WITH SENTINEL LYMPH NODE BIOPSY;  Surgeon: Lauren Kussmaul, Lauren Holt;  Location: [facility];  Service: General;  Laterality: Right;   PORT-A-CATH REMOVAL Left 01/06/2019   Procedure: REMOVAL LEFT CHEST PORT;  Surgeon: Irene Limbo, Lauren Holt;  Location: [facility];  Service: Plastics;  Laterality: Left;   PORTACATH PLACEMENT Left 11/08/2017   Procedure: INSERTION PORT-A-CATH;  Surgeon: Lauren Kussmaul, Lauren Holt;  Location: [facility];  Service: General;  Laterality: Left;   REDUCTION MAMMAPLASTY Left    REMOVAL OF TISSUE EXPANDER AND PLACEMENT OF IMPLANT Right 01/06/2019   Procedure: REMOVAL OF RIGHT TISSUE EXPANDER AND PLACEMENT OF IMPLANT;  Surgeon: Irene Limbo, Lauren Holt;  Location: [facility];  Service: Plastics;  Laterality: Right;   TUBAL LIGATION     VAGINAL HYSTERECTOMY     Partial Hysterectomy without BSO. Because they were too close to the bladder.    FAMILY HISTORY Family History  Problem Relation Age of Onset   Breast cancer Paternal Aunt    Breast cancer Cousin 64   Stroke Mother        caused blindness   Prostate cancer Father 27   Breast cancer Cousin 57   Breast cancer Cousin 25   Breast cancer Cousin 35   Breast cancer Other    The patient's father died at age 21 due to emphysema. The patient's mother is alive at age 73, turning 96 March 2020. The patient has 5 brothers and 3 sisters. There was a paternal great aunt with breast cancer. There was also a paternal cousin with breast cancer.    GYNECOLOGIC HISTORY:  No LMP recorded. Patient has had a hysterectomy. Menarche: 80 years old Age at first live  birth: 80 years old The patient is GXP3. She is s/p partial hysterectomy. Both of her ovaries remain. She used oral contraceptives with no complications. She also used HRT for 2 years without complications.     SOCIAL HISTORY:  Lauren Holt is now retired. 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 She is divorced.  2 days a week as she works with a "older lady" for about 4 hours mostly helping with some home issues and driving her around.  Her oldest daughter, [name]", is a [employer] in [place], [place]. The patient's son, [name], works in a Proofreader in [place]. The patient's son, [name], works in Quarry manager in [place].  The patient has 4 grandchildren. She currently does not belong to a church.   ADVANCED DIRECTIVES: Not in place.  At the 07/07/2017 visit the patient was given the appropriate documents to complete and notarized at her discretion.   HEALTH MAINTENANCE: Social History   Tobacco Use   Smoking status: Former    Years: 1.00    Types: Cigarettes   Smokeless tobacco: Never   Tobacco comments:    Smoked X 1 year at age 21 (less than ppd)  Vaping Use   Vaping Use: Never used  Substance Use Topics   Alcohol use: Yes    Comment: social   Drug use: Never     Colonoscopy: 02/2017 at [facility]  PAP:   Bone density: 2018/ osteopenia   No Known Allergies  Current Outpatient Medications  Medication Sig Dispense Refill   ALPRAZolam (XANAX) 0.25 MG tablet Take 0.125 mg by mouth 2 (two) times daily as needed for anxiety.      BETA CAROTENE PO Take 7,500 mcg by mouth daily.     calcium carbonate (CALCIUM 600) 600 MG TABS tablet Take 600 mg by mouth daily.     Cholecalciferol (VITAMIN D-3) 5000 units TABS Take 1 tablet by mouth daily.     Lutein 20 MG CAPS Take 20 mg by mouth daily.      Multiple Vitamins-Minerals (ICAPS AREDS FORMULA PO) Take 1 capsule by mouth 3 (three) times a week.      Probiotic Product  (PROBIOTIC DAILY PO) Take 1 capsule by mouth 3 (three) times a week.      Red Yeast Rice 600 MG CAPS Take 600 mg by mouth daily.      tamoxifen (NOLVADEX) 20 MG tablet Take 1 tablet (20 mg total) by mouth daily. 90 tablet 3   VITAMIN E PO Take 180 mg by mouth daily.     zinc gluconate 50 MG tablet Take 50 mg by mouth daily as needed (cold prevention).     No current facility-administered medications for this visit.    OBJECTIVE: White woman in no acute distress  Vitals:   04/01/21 0923  BP: (!) 144/80  Pulse: 77  Resp: 18  Temp: (!) 97 F (36.1 C)  SpO2: 98%     Body mass index is 32.47 kg/m.   Wt Readings from Last 3 Encounters:  04/01/21 177 lb 8 oz (80.5 kg)  05/30/20 176 lb 1.6 oz (79.9 kg)  11/14/19 175 lb (79.4 kg)  ECOG FS:1 - Symptomatic but completely ambulatory  Sclerae unicteric, EOMs intact Wearing a mask No cervical or supraclavicular adenopathy Lungs no rales or rhonchi Heart regular rate and rhythm Abd soft, nontender, positive bowel sounds MSK no focal spinal tenderness, no upper extremity lymphedema Neuro: nonfocal, well oriented, appropriate affect Breasts: The right breast is status postmastectomy and reconstruction.  There is no evidence of disease recurrence.  Palpation of the implant is smooth.  The left breast is status post reduction mammoplasty.  It is otherwise unremarkable.  Both axillae are benign.   LAB RESULTS:  CMP     Component Value Date/Time   NA 137 05/30/2020 1235   K 4.4 05/30/2020 1235   CL 102 05/30/2020 1235   CO2 28 05/30/2020 1235   GLUCOSE 120 (H) 05/30/2020 1235   BUN 12 05/30/2020 1235   CREATININE 0.79 05/30/2020 1235   CREATININE 0.71 12/15/2017 1049   CALCIUM 9.6 05/30/2020 1235   PROT 7.1 05/30/2020 1235   ALBUMIN 4.0 05/30/2020 1235   AST 20 05/30/2020 1235   AST 15 12/15/2017 1049   ALT 18 05/30/2020 1235   ALT 13 12/15/2017 1049   ALKPHOS 75 05/30/2020 1235   BILITOT 0.4 05/30/2020 1235   BILITOT 0.4  12/15/2017 1049   GFRNONAA >60 05/30/2020 1235   GFRNONAA >60 12/15/2017 1049   GFRAA >60 05/18/2018 1443   GFRAA >60 12/15/2017 1049    No results found for: TOTALPROTELP, ALBUMINELP, A1GS, A2GS, BETS, BETA2SER, GAMS, MSPIKE, SPEI  No results found  for: KPAFRELGTCHN, LAMBDASER, [facility]  Lab Results  Component Value Date   WBC 5.5 05/30/2020   NEUTROABS 3.4 05/30/2020   HGB 13.2 05/30/2020   HCT 40.7 05/30/2020   MCV 97.8 05/30/2020   PLT 259 05/30/2020   No results found for: LABCA2  No components found for: EZMOQH476  No results for input(s): INR in the last 168 hours.  No results found for: LABCA2  No results found for: LYY503  No results found for: TWS568  No results found for: LEX517  No results found for: CA2729  No components found for: HGQUANT  No results found for: CEA1 / No results found for: CEA1   No results found for: AFPTUMOR  No results found for: CHROMOGRNA  No results found for: HGBA, HGBA2QUANT, HGBFQUANT, HGBSQUAN (Hemoglobinopathy evaluation)   No results found for: LDH  No results found for: IRON, TIBC, IRONPCTSAT (Iron and TIBC)  No results found for: FERRITIN  Urinalysis    Component Value Date/Time   COLORURINE STRAW (A) 04/12/2018 1426   APPEARANCEUR CLEAR 04/12/2018 1426   LABSPEC 1.003 (L) 04/12/2018 1426   PHURINE 6.0 04/12/2018 1426   GLUCOSEU NEGATIVE 04/12/2018 1426   HGBUR NEGATIVE 04/12/2018 1426   BILIRUBINUR NEGATIVE 04/12/2018 1426   KETONESUR NEGATIVE 04/12/2018 1426   PROTEINUR NEGATIVE 04/12/2018 1426   NITRITE NEGATIVE 04/12/2018 1426   LEUKOCYTESUR NEGATIVE 04/12/2018 1426    STUDIES: No results found.    ELIGIBLE FOR AVAILABLE RESEARCH PROTOCOL: exact sciences study   ASSESSMENT: 80 y.o. [place], [place] woman status post right breast biopsy 07/01/2017 for a clinical mT2 N0, stage IB invasive ductal carcinoma, grade 1, estrogen and progesterone receptor positive, HER-2 nonamplified, with an  MIB-105%  (1) a second area in the right breast biopsied 07/01/2017 showed ductal carcinoma in situ, grade 3, estrogen and progesterone receptor positive.  (2) status post right mastectomy and sentinel lymph node sampling 09/24/2017 for a pT1b pN0, stage IA invasive ductal carcinoma, grade 1, with negative margins  (a) a total of 5 sentinel lymph nodes were removed  (b) definitive reconstruction with smooth silicone implant placement [Natrelle Inspira Smooth Round Extra Projection 615 implant placed right chest. REF [id number] SN [id number]. Lon 01/06/2019], with benign pathology; simultaneous left breast reduction    (3) The Oncotype DX score was 28, predicting a risk of outside the breast recurrence over the next 9 years of 17% if the patient's only systemic therapy is tamoxifen for 5 years.  It also predicts a significant benefit from chemotherapy.  (4) cyclophosphamide, methotrexate and fluorouracil (CMF) chemotherapy started 11/17/2017, repeated every 21 days x 8, last treatment 04/12/2018  (a) Udenyca added with cycle 4 and subsequent cycles due to neutropenia  (5) anastrozole started 08/04/2016, stopped August 2019 (chemotherapy),  resumed FEB 2020, discontinued September 2020 --   (a) bone density 12/07/2016 at Dr Virgilio Belling showed a T score of - 1.5 to -2.0  (b) tamoxifen started March 2022  (6) genetics testing 07/22/2017 through the Common Hereditary Cancer Panel offered by [facility] found no deleterious mutations in APC, ATM, AXIN2, BARD1, BMPR1A, BRCA1, BRCA2, BRIP1, CDH1, CDKN2A (p14ARF), CDKN2A (p16INK4a), CKD4, CHEK2, CTNNA1, DICER1, EPCAM (Deletion/duplication testing only), GREM1 (promoter region deletion/duplication testing only), KIT, MEN1, MLH1, MSH2, MSH3, MSH6, MUTYH, NBN, NF1, NHTL1, PALB2, PDGFRA, PMS2, POLD1, POLE, PTEN, RAD50, RAD51C, RAD51D, SDHB, SDHC, SDHD, SMAD4, SMARCA4. STK11, TP53, TSC1, TSC2, and VHL.  The following genes were evaluated for sequence changes only: SDHA and  HOXB13 c.251G>A variant only.   (a) a  variant of uncertain significance in the gene DICER1 was identified.  c.2116+6G>A (Intronic)  (7) CT A/P done on 02/11/2018 for LLQ pain, no cause for pain identified, but two indeterminate liver lesions were noted  (a) MRI liver 05/02/2019 shows no change in the lesion of concern, with a second very small lesion in the inferior tip of the right liver.  (b) repeat liver MRI 03/27/2020 stable and consistent with a benign hemangioma   PLAN: Lauren Holt is now 3-1/2 years out from definitive surgery for her breast cancer with no evidence of disease recurrence.  This is favorable.  She is tolerating tamoxifen well and the plan will be to continue antiestrogens a minimum of 5 years.  She is behind on mammography and I have placed the order for her left screening mammogram within the next month.  I suggested that she stop using Neosporin in her labia.  This probably is killing the normal flora and allowing the MRSA to thrive.  She can use coconut oil for example instead.  She will return to see [place] in May.  She knows to call for any other issue that may develop before then  Total encounter time 25 minutes.Lauren Jews C. Kristianne Albin, Lauren Holt  04/01/21 9:27 AM Medical Oncology and Hematology [facility] [place], [place] [address] Tel. [phone]    Fax. [phone]   I, Wilburn Mylar, am acting as scribe for Dr. Virgie Holt. Lauren Holt.  I, Lurline Del Lauren Holt, have reviewed the above documentation for accuracy and completeness, and I agree with the above.   *Total Encounter Time as defined by the Centers for Medicare and Medicaid Services includes, in addition to the face-to-face time of a patient visit (documented in the note above) non-face-to-face time: obtaining and reviewing outside history, ordering and reviewing medications, tests or procedures, care coordination (communications with other health care professionals or caregivers)  and documentation in the medical record.

## 2021-04-01 ENCOUNTER — Other Ambulatory Visit: Payer: Self-pay

## 2021-04-01 ENCOUNTER — Inpatient Hospital Stay: Payer: Medicare HMO | Attending: Oncology | Admitting: Oncology

## 2021-04-01 ENCOUNTER — Inpatient Hospital Stay: Payer: Medicare HMO | Admitting: Oncology

## 2021-04-01 VITALS — BP 144/80 | HR 77 | Temp 97.0°F | Resp 18 | Ht 62.0 in | Wt 177.5 lb

## 2021-04-01 DIAGNOSIS — Z8042 Family history of malignant neoplasm of prostate: Secondary | ICD-10-CM | POA: Insufficient documentation

## 2021-04-01 DIAGNOSIS — E785 Hyperlipidemia, unspecified: Secondary | ICD-10-CM | POA: Diagnosis not present

## 2021-04-01 DIAGNOSIS — Z170 Estrogen receptor positive status [ER+]: Secondary | ICD-10-CM

## 2021-04-01 DIAGNOSIS — Z79899 Other long term (current) drug therapy: Secondary | ICD-10-CM | POA: Insufficient documentation

## 2021-04-01 DIAGNOSIS — C50311 Malignant neoplasm of lower-inner quadrant of right female breast: Secondary | ICD-10-CM

## 2021-04-01 DIAGNOSIS — C50911 Malignant neoplasm of unspecified site of right female breast: Secondary | ICD-10-CM | POA: Insufficient documentation

## 2021-04-01 DIAGNOSIS — Z79810 Long term (current) use of selective estrogen receptor modulators (SERMs): Secondary | ICD-10-CM | POA: Diagnosis not present

## 2021-04-01 DIAGNOSIS — Z8614 Personal history of Methicillin resistant Staphylococcus aureus infection: Secondary | ICD-10-CM | POA: Diagnosis not present

## 2021-04-01 DIAGNOSIS — Z803 Family history of malignant neoplasm of breast: Secondary | ICD-10-CM | POA: Diagnosis not present

## 2021-04-01 DIAGNOSIS — Z923 Personal history of irradiation: Secondary | ICD-10-CM | POA: Diagnosis not present

## 2021-04-01 DIAGNOSIS — Z87891 Personal history of nicotine dependence: Secondary | ICD-10-CM | POA: Diagnosis not present

## 2021-04-01 MED ORDER — TAMOXIFEN CITRATE 20 MG PO TABS
20.0000 mg | ORAL_TABLET | Freq: Every day | ORAL | 3 refills | Status: DC
Start: 2021-04-01 — End: 2022-01-23

## 2021-04-30 DIAGNOSIS — Z9011 Acquired absence of right breast and nipple: Secondary | ICD-10-CM | POA: Diagnosis not present

## 2021-04-30 DIAGNOSIS — Z853 Personal history of malignant neoplasm of breast: Secondary | ICD-10-CM | POA: Diagnosis not present

## 2021-05-02 DIAGNOSIS — Z853 Personal history of malignant neoplasm of breast: Secondary | ICD-10-CM | POA: Diagnosis not present

## 2021-05-02 DIAGNOSIS — Z0000 Encounter for general adult medical examination without abnormal findings: Secondary | ICD-10-CM | POA: Diagnosis not present

## 2021-05-02 DIAGNOSIS — F419 Anxiety disorder, unspecified: Secondary | ICD-10-CM | POA: Diagnosis not present

## 2021-05-02 DIAGNOSIS — E7800 Pure hypercholesterolemia, unspecified: Secondary | ICD-10-CM | POA: Diagnosis not present

## 2021-05-02 DIAGNOSIS — J069 Acute upper respiratory infection, unspecified: Secondary | ICD-10-CM | POA: Diagnosis not present

## 2021-05-02 DIAGNOSIS — M8580 Other specified disorders of bone density and structure, unspecified site: Secondary | ICD-10-CM | POA: Diagnosis not present

## 2021-05-02 DIAGNOSIS — L719 Rosacea, unspecified: Secondary | ICD-10-CM | POA: Diagnosis not present

## 2021-05-13 DIAGNOSIS — N393 Stress incontinence (female) (male): Secondary | ICD-10-CM | POA: Diagnosis not present

## 2021-05-13 DIAGNOSIS — R531 Weakness: Secondary | ICD-10-CM | POA: Diagnosis not present

## 2021-05-20 ENCOUNTER — Other Ambulatory Visit: Payer: Self-pay

## 2021-05-20 ENCOUNTER — Ambulatory Visit
Admission: RE | Admit: 2021-05-20 | Discharge: 2021-05-20 | Disposition: A | Discharge status: Home / Self Care | Payer: Medicare HMO | Source: Ambulatory Visit | Attending: Family Medicine | Admitting: Family Medicine

## 2021-05-20 DIAGNOSIS — Z1231 Encounter for screening mammogram for malignant neoplasm of breast: Secondary | ICD-10-CM | POA: Diagnosis not present

## 2021-05-20 DIAGNOSIS — C50311 Malignant neoplasm of lower-inner quadrant of right female breast: Secondary | ICD-10-CM

## 2021-06-05 DIAGNOSIS — N393 Stress incontinence (female) (male): Secondary | ICD-10-CM | POA: Diagnosis not present

## 2021-06-05 DIAGNOSIS — R531 Weakness: Secondary | ICD-10-CM | POA: Diagnosis not present

## 2021-06-11 DIAGNOSIS — N393 Stress incontinence (female) (male): Secondary | ICD-10-CM | POA: Diagnosis not present

## 2021-06-11 DIAGNOSIS — R531 Weakness: Secondary | ICD-10-CM | POA: Diagnosis not present

## 2021-06-19 DIAGNOSIS — N393 Stress incontinence (female) (male): Secondary | ICD-10-CM | POA: Diagnosis not present

## 2021-06-19 DIAGNOSIS — R531 Weakness: Secondary | ICD-10-CM | POA: Diagnosis not present

## 2021-07-02 DIAGNOSIS — R531 Weakness: Secondary | ICD-10-CM | POA: Diagnosis not present

## 2021-07-02 DIAGNOSIS — N393 Stress incontinence (female) (male): Secondary | ICD-10-CM | POA: Diagnosis not present

## 2021-07-03 DIAGNOSIS — E349 Endocrine disorder, unspecified: Secondary | ICD-10-CM | POA: Diagnosis not present

## 2021-07-03 DIAGNOSIS — Z1331 Encounter for screening for depression: Secondary | ICD-10-CM | POA: Diagnosis not present

## 2021-07-03 DIAGNOSIS — Z0000 Encounter for general adult medical examination without abnormal findings: Secondary | ICD-10-CM | POA: Diagnosis not present

## 2021-07-03 DIAGNOSIS — E8881 Metabolic syndrome: Secondary | ICD-10-CM | POA: Diagnosis not present

## 2021-07-03 DIAGNOSIS — E7800 Pure hypercholesterolemia, unspecified: Secondary | ICD-10-CM | POA: Diagnosis not present

## 2021-07-03 DIAGNOSIS — Z79899 Other long term (current) drug therapy: Secondary | ICD-10-CM | POA: Diagnosis not present

## 2021-07-03 DIAGNOSIS — E669 Obesity, unspecified: Secondary | ICD-10-CM | POA: Diagnosis not present

## 2021-07-03 DIAGNOSIS — R5383 Other fatigue: Secondary | ICD-10-CM | POA: Diagnosis not present

## 2021-07-03 DIAGNOSIS — Z131 Encounter for screening for diabetes mellitus: Secondary | ICD-10-CM | POA: Diagnosis not present

## 2021-07-03 DIAGNOSIS — R0602 Shortness of breath: Secondary | ICD-10-CM | POA: Diagnosis not present

## 2021-07-03 DIAGNOSIS — E559 Vitamin D deficiency, unspecified: Secondary | ICD-10-CM | POA: Diagnosis not present

## 2021-07-03 DIAGNOSIS — D539 Nutritional anemia, unspecified: Secondary | ICD-10-CM | POA: Diagnosis not present

## 2021-07-03 DIAGNOSIS — Z1159 Encounter for screening for other viral diseases: Secondary | ICD-10-CM | POA: Diagnosis not present

## 2021-07-03 DIAGNOSIS — R635 Abnormal weight gain: Secondary | ICD-10-CM | POA: Diagnosis not present

## 2021-07-03 DIAGNOSIS — Z9181 History of falling: Secondary | ICD-10-CM | POA: Diagnosis not present

## 2021-08-02 ENCOUNTER — Encounter (HOSPITAL_COMMUNITY): Payer: Self-pay

## 2021-08-02 ENCOUNTER — Other Ambulatory Visit: Payer: Self-pay

## 2021-08-02 ENCOUNTER — Emergency Department (HOSPITAL_COMMUNITY): Admission: EM | Admit: 2021-08-02 | Payer: Medicare HMO | Source: Home / Self Care

## 2021-08-02 ENCOUNTER — Encounter (HOSPITAL_COMMUNITY)
Admission: EM | Disposition: A | Discharge status: Home / Self Care | Payer: Self-pay | Source: Home / Self Care | Attending: Emergency Medicine

## 2021-08-02 ENCOUNTER — Emergency Department (HOSPITAL_COMMUNITY): Payer: Medicare HMO

## 2021-08-02 ENCOUNTER — Ambulatory Visit (HOSPITAL_COMMUNITY)
Admission: EM | Admit: 2021-08-02 | Discharge: 2021-08-03 | Disposition: A | Discharge status: Home / Self Care | Payer: Medicare HMO | Attending: Emergency Medicine | Admitting: Emergency Medicine

## 2021-08-02 ENCOUNTER — Ambulatory Visit: Admit: 2021-08-02 | Payer: Medicare HMO | Admitting: Orthopedic Surgery

## 2021-08-02 DIAGNOSIS — S68625A Partial traumatic transphalangeal amputation of left ring finger, initial encounter: Secondary | ICD-10-CM | POA: Diagnosis not present

## 2021-08-02 DIAGNOSIS — Z853 Personal history of malignant neoplasm of breast: Secondary | ICD-10-CM | POA: Insufficient documentation

## 2021-08-02 DIAGNOSIS — S68125A Partial traumatic metacarpophalangeal amputation of left ring finger, initial encounter: Secondary | ICD-10-CM | POA: Diagnosis not present

## 2021-08-02 DIAGNOSIS — W232XXA Caught, crushed, jammed or pinched between a moving and stationary object, initial encounter: Secondary | ICD-10-CM | POA: Insufficient documentation

## 2021-08-02 DIAGNOSIS — Z87891 Personal history of nicotine dependence: Secondary | ICD-10-CM | POA: Diagnosis not present

## 2021-08-02 DIAGNOSIS — S61215A Laceration without foreign body of left ring finger without damage to nail, initial encounter: Secondary | ICD-10-CM | POA: Diagnosis not present

## 2021-08-02 DIAGNOSIS — S61211A Laceration without foreign body of left index finger without damage to nail, initial encounter: Secondary | ICD-10-CM | POA: Insufficient documentation

## 2021-08-02 DIAGNOSIS — S6992XA Unspecified injury of left wrist, hand and finger(s), initial encounter: Secondary | ICD-10-CM | POA: Diagnosis present

## 2021-08-02 HISTORY — PX: AMPUTATION: SHX166

## 2021-08-02 SURGERY — AMPUTATION DIGIT
Anesthesia: General | Site: Ring Finger | Laterality: Left

## 2021-08-02 MED ORDER — MIDAZOLAM HCL 2 MG/2ML IJ SOLN
INTRAMUSCULAR | Status: AC
  Filled 2021-08-02: qty 2

## 2021-08-02 MED ORDER — FENTANYL CITRATE (PF) 250 MCG/5ML IJ SOLN
INTRAMUSCULAR | Status: AC
  Filled 2021-08-02: qty 5

## 2021-08-02 MED ORDER — PROPOFOL 10 MG/ML IV BOLUS
INTRAVENOUS | Status: AC
  Filled 2021-08-02: qty 20

## 2021-08-02 MED ORDER — HYDROGEN PEROXIDE 3 % EX SOLN
CUTANEOUS | Status: AC
  Filled 2021-08-02: qty 473

## 2021-08-02 SURGICAL SUPPLY — 38 items
BAG COUNTER SPONGE SURGICOUNT (BAG) ×3 IMPLANT
BAG SPNG CNTER NS LX DISP (BAG) ×1
BLADE LONG MED 31X9 (MISCELLANEOUS) ×3 IMPLANT
BNDG CMPR 9X4 STRL LF SNTH (GAUZE/BANDAGES/DRESSINGS) ×1
BNDG COHESIVE 1X5 TAN STRL LF (GAUZE/BANDAGES/DRESSINGS) ×3 IMPLANT
BNDG ESMARK 4X9 LF (GAUZE/BANDAGES/DRESSINGS) ×3 IMPLANT
BNDG GAUZE ELAST 4 BULKY (GAUZE/BANDAGES/DRESSINGS) IMPLANT
CORD BIPOLAR FORCEPS 12FT (ELECTRODE) ×3 IMPLANT
CUFF TOURN SGL QUICK 18X4 (TOURNIQUET CUFF) ×3 IMPLANT
CUFF TOURN SGL QUICK 24 (TOURNIQUET CUFF)
CUFF TRNQT CYL 24X4X16.5-23 (TOURNIQUET CUFF) IMPLANT
DRSG XEROFORM 1X8 (GAUZE/BANDAGES/DRESSINGS) ×1 IMPLANT
DURAPREP 26ML APPLICATOR (WOUND CARE) ×2 IMPLANT
GAUZE SPONGE 4X4 12PLY STRL (GAUZE/BANDAGES/DRESSINGS) ×1 IMPLANT
GAUZE XEROFORM 1X8 LF (GAUZE/BANDAGES/DRESSINGS) ×3 IMPLANT
GLOVE BIO SURGEON STRL SZ7.5 (GLOVE) ×3 IMPLANT
GLOVE BIOGEL PI IND STRL 8 (GLOVE) ×2 IMPLANT
GLOVE BIOGEL PI INDICATOR 8 (GLOVE) ×1
GOWN STRL REUS W/ TWL LRG LVL3 (GOWN DISPOSABLE) ×2 IMPLANT
GOWN STRL REUS W/ TWL XL LVL3 (GOWN DISPOSABLE) ×2 IMPLANT
GOWN STRL REUS W/TWL LRG LVL3 (GOWN DISPOSABLE) ×2
GOWN STRL REUS W/TWL XL LVL3 (GOWN DISPOSABLE) ×2
KIT BASIN OR (CUSTOM PROCEDURE TRAY) ×3 IMPLANT
KIT TURNOVER KIT B (KITS) ×3 IMPLANT
NDL HYPO 25GX1X1/2 BEV (NEEDLE) IMPLANT
NEEDLE HYPO 25GX1X1/2 BEV (NEEDLE) IMPLANT
NS IRRIG 1000ML POUR BTL (IV SOLUTION) ×3 IMPLANT
PACK ORTHO EXTREMITY (CUSTOM PROCEDURE TRAY) ×3 IMPLANT
PAD ARMBOARD 7.5X6 YLW CONV (MISCELLANEOUS) ×6 IMPLANT
PAD CAST 4YDX4 CTTN HI CHSV (CAST SUPPLIES) IMPLANT
PADDING CAST COTTON 4X4 STRL (CAST SUPPLIES)
SPECIMEN JAR SMALL (MISCELLANEOUS) ×3 IMPLANT
SUT CHROMIC 6 0 PS 4 (SUTURE) IMPLANT
SUT MON AB 5-0 PS2 18 (SUTURE) IMPLANT
SUT VICRYL 4-0 PS2 18IN ABS (SUTURE) IMPLANT
SYR CONTROL 10ML LL (SYRINGE) IMPLANT
TOWEL GREEN STERILE (TOWEL DISPOSABLE) ×3 IMPLANT
UNDERPAD 30X36 HEAVY ABSORB (UNDERPADS AND DIAPERS) ×3 IMPLANT

## 2021-08-02 NOTE — Anesthesia Preprocedure Evaluation (Addendum)
Anesthesia Evaluation  ?Patient identified by MRN, date of birth, ID band ?Patient awake ? ? ? ?Reviewed: ?Allergy & Precautions, NPO status , Patient's Chart, lab work & pertinent test results ? ?Airway ?Mallampati: II ? ?TM Distance: >3 FB ?Neck ROM: Full ? ? ? Dental ?no notable dental hx. ? ?  ?Pulmonary ?former smoker,  ?  ?Pulmonary exam normal ? ? ? ? ? ? ? Cardiovascular ?negative cardio ROS ?Normal cardiovascular exam ? ? ?  ?Neuro/Psych ?PSYCHIATRIC DISORDERS Anxiety Depression  Neuromuscular disease   ? GI/Hepatic ?negative GI ROS, Neg liver ROS,   ?Endo/Other  ?Breast cancer ? Renal/GU ?negative Renal ROS  ? ?  ?Musculoskeletal ? ?(+) Fibromyalgia - ? Abdominal ?  ?Peds ? Hematology ?negative hematology ROS ?(+)   ?Anesthesia Other Findings ?Left ring finger Partial Amputation ? Reproductive/Obstetrics ? ?  ? ? ? ? ? ? ? ? ? ? ? ? ? ?  ?  ? ? ? ? ? ? ? ?Anesthesia Physical ?Anesthesia Plan ? ?ASA: 2 and emergent ? ?Anesthesia Plan: General  ? ?Post-op Pain Management:   ? ?Induction: Intravenous ? ?PONV Risk Score and Plan: 3 and Ondansetron, Dexamethasone, Midazolam and Treatment may vary due to age or medical condition ? ?Airway Management Planned: Oral ETT ? ?Additional Equipment:  ? ?Intra-op Plan:  ? ?Post-operative Plan: Extubation in OR ? ?Informed Consent: I have reviewed the patients History and Physical, chart, labs and discussed the procedure including the risks, benefits and alternatives for the proposed anesthesia with the patient or authorized representative who has indicated his/her understanding and acceptance.  ? ? ? ?Dental advisory given ? ?Plan Discussed with: CRNA and Surgeon ? ?Anesthesia Plan Comments:   ? ? ? ? ? ? ?Anesthesia Quick Evaluation ? ?

## 2021-08-02 NOTE — ED Notes (Signed)
Pt called for X4. Pt not able to be located. ?

## 2021-08-02 NOTE — ED Provider Notes (Signed)
?[facility] DEPT ?Provider Note ? ? ?CSN: [id number] ?Arrival date & time: 08/02/21  1859 ? ?  ? ?History ? ?Chief Complaint  ?Patient presents with  ? Laceration  ? ? ?Lauren Holt is a 81 y.o. female who presents to the emergency department complaining of left finger laceration onset prior to arrival.  Patient notes that she smashed her finger between the door frame of the fridge.  She took 800 mg ibuprofen prior to arrival to the ED.  Denies being on anticoagulants at this time.  Patient notes that her tetanus is up-to-date.  Denies pain at this time. ? ?The history is provided by the patient. No language interpreter was used.  ? ?  ? ?Home Medications ?Prior to Admission medications   ?Medication Sig Start Date End Date Taking? Authorizing Provider  ?ALPRAZolam (XANAX) 0.25 MG tablet Take 0.125 mg by mouth 2 (two) times daily as needed for anxiety.  08/03/17   [provider]  ?BETA CAROTENE PO Take 7,500 mcg by mouth daily.    [provider]  ?calcium carbonate (CALCIUM 600) 600 MG TABS tablet Take 600 mg by mouth daily.    [provider]  ?Cholecalciferol (VITAMIN D-3) 5000 units TABS Take 1 tablet by mouth daily.    [provider]  ?Lutein 20 MG CAPS Take 20 mg by mouth daily.     [provider]  ?Multiple Vitamins-Minerals (ICAPS AREDS FORMULA PO) Take 1 capsule by mouth 3 (three) times a week.     [provider]  ?Probiotic Product (PROBIOTIC DAILY PO) Take 1 capsule by mouth 3 (three) times a week.     [provider]  ?Red Yeast Rice 600 MG CAPS Take 600 mg by mouth daily.     [provider]  ?tamoxifen (NOLVADEX) 20 MG tablet Take 1 tablet (20 mg total) by mouth daily. 04/01/21   Magrinat, Virgie Dad, MD  ?VITAMIN E PO Take 180 mg by mouth daily.    [provider]  ?zinc gluconate 50 MG tablet Take 50 mg by mouth daily as needed (cold prevention).    [provider]  ?    ? ?Allergies    ?Patient has no known allergies.   ? ?Review of Systems   ?Review of Systems  ?Musculoskeletal:  Negative for arthralgias and joint swelling.  ?Skin:  Positive for wound. Negative for color change.  ?All other systems reviewed and are negative. ? ?Physical Exam ?Updated Vital Signs ?BP 134/71   Pulse 71   Temp 98.6 ?F (37 ?C)   Resp 16   Ht '5\' 4"'$  (1.626 m)   Wt 75.8 kg   SpO2 100%   BMI 28.67 kg/m?  ?Physical Exam ?Vitals and nursing note reviewed.  ?Constitutional:   ?   General: She is not in acute distress. ?   Appearance: Normal appearance.  ?Eyes:  ?   General: No scleral icterus. ?   Extraocular Movements: Extraocular movements intact.  ?Cardiovascular:  ?   Rate and Rhythm: Normal rate.  ?Pulmonary:  ?   Effort: Pulmonary effort is normal. No respiratory distress.  ?Musculoskeletal:  ?   Cervical back: Neck supple.  ?   Comments: No tenderness to palpation to distal left fourth digit.  Avulsion of palmar aspect of distal left fourth digit.  Nail intact.  No exposed bone.  Radial pulses intact.  Sensation intact.  Full active range of motion of hand.  See pictures below.  ?Skin: ?  General: Skin is warm and dry.  ?   Findings: No bruising, erythema or rash.  ?Neurological:  ?   Mental Status: She is alert.  ?Psychiatric:     ?   Behavior: Behavior normal.  ? ? ?^^ distal aspect of left 4th digit. Nail intact ? ? ? ?ED Results / Procedures / Treatments   ?Labs ?(all labs ordered are listed, but only abnormal results are displayed) ?Labs Reviewed - No data to display ? ?EKG ?None ? ?Radiology ?DG Finger Ring Left ? ?Result Date: 08/02/2021 ?CLINICAL DATA:  Crush injury. EXAM: LEFT RING FINGER 2+V COMPARISON:  None. FINDINGS: There is a soft tissue laceration at the tip of the left ring finger. Small bone fragments noted off the tip of the left ring finger distal phalanx no subluxation or dislocation. IMPRESSION: Soft tissue laceration and small bone fragments at the tip of the left ring  finger distal phalanx. Electronically Signed   By: Rolm Baptise M.D.   On: 08/02/2021 19:52   ? ?Procedures ?Procedures  ? ? ?Medications Ordered in ED ?Medications  ?hydrogen peroxide 3 % external solution (has no administration in time range)  ? ? ?ED Course/ Medical Decision Making/ A&P ?Clinical Course as of 08/02/21 2120  ?Sat Aug 02, 2021  ?2019 Consult with hand surgeon, Dr. Fredna Dow who recommends patient presenting to [facility] emergency department to have patient go to the OR to have the area cleaned out.  Dr. Fredna Dow noted that he will call back after he checks on the patient's n.p.o. status.  Patient last ate at 5:30 PM (small salad with some shrimp) and 6:30 PM (cottage cheese and yogurt). [SB]  ?2028 Discussed with patient in detail regarding hand surgeon recommendations.  Answered all available questions. [SB]  ?2029 Spoke with hand specialist, Dr. Fredna Dow who recommended dressing the wound prior to transfer via POV to [facility] ED.  [SB]  ?2040 Call to Dr. Vanita Panda over at [facility] regarding patient care. [SB]  ?  ?Clinical Course User Index ?[SB] Kristene Liberati A, PA-C  ? ?                        ?Medical Decision Making ? ?Patient with left fourth digit laceration onset prior to arrival status post closing her finger in the door frame of the fridge.  Tetanus up-to-date (most recent tetanus in June 2021).  Vital signs stable, patient afebrile.  On exam patient with no tenderness to palpation noted to distal left fourth digit.  Avulsion injury of the palmar aspect of the left fourth digit.  Nail intact.  No exposed bone.  Sensation intact.  Full active range of motion of the hand.  Differential diagnosis includes fracture, dislocation, laceration, avulsion injury.   ? ?Imaging: ?I ordered imaging studies including left ring finger x-ray ?I independently visualized and interpreted imaging which showed:  ?Soft tissue laceration and small bone fragments at the tip of the  ?left ring finger distal phalanx.   ? ?I agree with the radiologist interpretation ? ?Consultations: ?I requested consultation with the hand surgeon, Dr. Fredna Dow, and discussed lab and imaging findings as well as pertinent plan - they recommend: Transfer to [facility] ED so the patient can go to the OR tonight to have the area cleaned.  Also recommended that patient remain n.p.o. at this time.  ? ? ?Disposition: ?Patient presentation suspicious for avulsion injury and tuft fracture with bone fragments.  Doubt dislocation at this time.  No laceration that can be repaired in the emergency department. After consideration of the diagnostic results and the patients response to treatment, I feel that the patient would benefit from transfer to [facility] ED as per his specialist recommendations to present to the [place] to have the area cleaned out.  Case discussed with attending who also evaluated patient and agrees with transfer to [facility] ED.  Discussed with patient importance of remaining n.p.o. while in route to [facility] emergency department.  Answered all available questions.  Patient appears safe for transfer at this time.  ? ?This chart was dictated using voice recognition software, Dragon. Despite the best efforts of this provider to proofread and correct errors, errors may still occur which can change documentation meaning. ? ?Final Clinical Impression(s) / ED Diagnoses ?Final diagnoses:  ?Laceration of left index finger without foreign body without damage to nail, initial encounter  ? ? ?Rx / DC Orders ?ED Discharge Orders   ? ? None  ? ?  ? ? ?  ?Tymel Conely A, PA-C ?08/02/21 2120 ? ?  ?Davonna Belling, MD ?08/02/21 2318 ? ?

## 2021-08-02 NOTE — ED Provider Triage Note (Signed)
Emergency Medicine Provider Triage Evaluation Note ? ?Lauren Holt , a 81 y.o. female  was evaluated in triage.  Pt complains of left ring finger injury.  Patient states she accidentally crushed her finger between a door frame in her refrigerator earlier this evening.  Bleeding is now controlled.  Denies using blood thinners ? ?Review of Systems  ?Positive: Finger injury ?Negative: Shortness of breath ? ?Physical Exam  ?BP (!) 161/65 (BP Location: Left Arm)   Pulse 86   Temp 98.3 ?F (36.8 ?C) (Oral)   Resp 16   Ht '5\' 4"'$  (1.626 m)   Wt 75.8 kg   SpO2 96%   BMI 28.67 kg/m?  ?Gen:   Awake, no distress   ?Resp:  Normal effort  ?MSK:   Moves extremities without difficulty  ?Other:  Unable to tell extent of injury in triage without cleaning wound ? ?Medical Decision Making  ?Medically screening exam initiated at 7:32 PM.  Appropriate orders placed.  Lauren Holt was informed that the remainder of the evaluation will be completed by another provider, this initial triage assessment does not replace that evaluation, and the importance of remaining in the ED until their evaluation is complete. ? ? ? ? ? ?  ?Dorothyann Peng, PA-C ?08/02/21 1933 ? ?

## 2021-08-02 NOTE — ED Triage Notes (Addendum)
Pt states that she smashed her finger in between the fridge and wall. (4th finger left hand). The skin is removed from the tip of the finger. Pt reports taking a 800 mg Ibuprofen before coming in.   ?

## 2021-08-02 NOTE — Discharge Instructions (Addendum)

## 2021-08-02 NOTE — ED Provider Notes (Signed)
Patient is a 81 year old female who presents to the emergency department as an ED to ED transfer from [facility] for hand surgery evaluation.  Patient accidentally closed her left fourth digit in a door frame against a fridge.  She was found to have a soft tissue laceration to the distal tip of the affected finger.  X-rays were ordered which showed "soft tissue laceration and small bone fragments at the tip of the left ring finger distal phalanx." ? ?On my assessment patient is lying comfortably in bed.  Wound is appropriately bandaged.  Denies any p.o. intake since about 5:30 PM this afternoon. ? ?Patient discussed with Dr. Fredna Dow who is expecting the patient in the operating room.  Patient will be transferred to the operating room shortly.  ?  ?Rayna Sexton, PA-C ?08/02/21 2345 ? ?  ?Maudie Flakes, MD ?08/03/21 [phone] ? ?

## 2021-08-03 ENCOUNTER — Telehealth: Payer: Self-pay | Admitting: Orthopedic Surgery

## 2021-08-03 ENCOUNTER — Emergency Department (EMERGENCY_DEPARTMENT_HOSPITAL): Payer: Medicare HMO | Admitting: Anesthesiology

## 2021-08-03 ENCOUNTER — Other Ambulatory Visit: Payer: Self-pay

## 2021-08-03 ENCOUNTER — Emergency Department (HOSPITAL_COMMUNITY): Payer: Medicare HMO | Admitting: Anesthesiology

## 2021-08-03 DIAGNOSIS — S68125A Partial traumatic metacarpophalangeal amputation of left ring finger, initial encounter: Secondary | ICD-10-CM

## 2021-08-03 DIAGNOSIS — S61211A Laceration without foreign body of left index finger without damage to nail, initial encounter: Secondary | ICD-10-CM | POA: Diagnosis not present

## 2021-08-03 DIAGNOSIS — Z87891 Personal history of nicotine dependence: Secondary | ICD-10-CM | POA: Diagnosis not present

## 2021-08-03 DIAGNOSIS — Z853 Personal history of malignant neoplasm of breast: Secondary | ICD-10-CM | POA: Diagnosis not present

## 2021-08-03 DIAGNOSIS — S68625A Partial traumatic transphalangeal amputation of left ring finger, initial encounter: Secondary | ICD-10-CM | POA: Diagnosis not present

## 2021-08-03 MED ORDER — HYDROCODONE-ACETAMINOPHEN 5-325 MG PO TABS: ORAL_TABLET | ORAL | 0 refills | Status: DC

## 2021-08-03 MED ORDER — CEFAZOLIN SODIUM-DEXTROSE 2-3 GM-%(50ML) IV SOLR
INTRAVENOUS | Status: DC | PRN
  Administered 2021-08-03: 2 g via INTRAVENOUS

## 2021-08-03 MED ORDER — KETOROLAC TROMETHAMINE 15 MG/ML IJ SOLN
INTRAMUSCULAR | Status: AC
  Filled 2021-08-03: qty 1

## 2021-08-03 MED ORDER — ACETAMINOPHEN 10 MG/ML IV SOLN
INTRAVENOUS | Status: AC
  Filled 2021-08-03: qty 100

## 2021-08-03 MED ORDER — SULFAMETHOXAZOLE-TRIMETHOPRIM 800-160 MG PO TABS: 1.0000 | ORAL_TABLET | Freq: Two times a day (BID) | ORAL | 0 refills | Status: DC

## 2021-08-03 MED ORDER — ONDANSETRON HCL 4 MG/2ML IJ SOLN
INTRAMUSCULAR | Status: DC | PRN
  Administered 2021-08-03: 4 mg via INTRAVENOUS

## 2021-08-03 MED ORDER — ACETAMINOPHEN 10 MG/ML IV SOLN
1000.0000 mg | Freq: Once | INTRAVENOUS | Status: DC | PRN
  Administered 2021-08-03: 1000 mg via INTRAVENOUS

## 2021-08-03 MED ORDER — KETOROLAC TROMETHAMINE 15 MG/ML IJ SOLN
15.0000 mg | Freq: Once | INTRAMUSCULAR | Status: AC | PRN
  Administered 2021-08-03: 15 mg via INTRAVENOUS

## 2021-08-03 MED ORDER — FENTANYL CITRATE (PF) 250 MCG/5ML IJ SOLN
INTRAMUSCULAR | Status: DC | PRN
  Administered 2021-08-03: 100 ug via INTRAVENOUS

## 2021-08-03 MED ORDER — SUCCINYLCHOLINE CHLORIDE 200 MG/10ML IV SOSY
PREFILLED_SYRINGE | INTRAVENOUS | Status: DC | PRN
  Administered 2021-08-03: 80 mg via INTRAVENOUS

## 2021-08-03 MED ORDER — PROPOFOL 10 MG/ML IV BOLUS
INTRAVENOUS | Status: DC | PRN
  Administered 2021-08-03: 100 mg via INTRAVENOUS

## 2021-08-03 MED ORDER — FENTANYL CITRATE (PF) 100 MCG/2ML IJ SOLN: 25.0000 ug | INTRAMUSCULAR | Status: DC | PRN

## 2021-08-03 MED ORDER — SULFAMETHOXAZOLE-TRIMETHOPRIM 800-160 MG PO TABS
1.0000 | ORAL_TABLET | Freq: Two times a day (BID) | ORAL | 0 refills | Status: DC
Start: 2021-08-03 — End: 2021-08-03

## 2021-08-03 MED ORDER — LACTATED RINGERS IV SOLN: INTRAVENOUS | Status: DC | PRN

## 2021-08-03 MED ORDER — 0.9 % SODIUM CHLORIDE (POUR BTL) OPTIME
TOPICAL | Status: DC | PRN
  Administered 2021-08-03: 1000 mL

## 2021-08-03 MED ORDER — BUPIVACAINE HCL (PF) 0.5 % IJ SOLN
INTRAMUSCULAR | Status: DC | PRN
  Administered 2021-08-03: 9 mL

## 2021-08-03 MED ORDER — BUPIVACAINE HCL 0.5 % IJ SOLN
INTRAMUSCULAR | Status: AC
  Filled 2021-08-03: qty 1

## 2021-08-03 MED ORDER — ONDANSETRON HCL 4 MG/2ML IJ SOLN: 4.0000 mg | Freq: Once | INTRAMUSCULAR | Status: DC | PRN

## 2021-08-03 MED ORDER — MIDAZOLAM HCL 5 MG/5ML IJ SOLN
INTRAMUSCULAR | Status: DC | PRN
  Administered 2021-08-03: 2 mg via INTRAVENOUS

## 2021-08-03 MED ORDER — EPHEDRINE SULFATE (PRESSORS) 50 MG/ML IJ SOLN
INTRAMUSCULAR | Status: DC | PRN
  Administered 2021-08-03: 5 mg via INTRAVENOUS

## 2021-08-03 MED ORDER — AMISULPRIDE (ANTIEMETIC) 5 MG/2ML IV SOLN: 10.0000 mg | Freq: Once | INTRAVENOUS | Status: DC | PRN

## 2021-08-03 MED ORDER — LIDOCAINE HCL (CARDIAC) PF 100 MG/5ML IV SOSY
PREFILLED_SYRINGE | INTRAVENOUS | Status: DC | PRN
  Administered 2021-08-03: 60 mg via INTRATRACHEAL

## 2021-08-03 NOTE — ED Notes (Signed)
Report given to OR CRNA. All questions answered at this time.  ?

## 2021-08-03 NOTE — Anesthesia Procedure Notes (Signed)
Procedure Name: Intubation ?Date/Time: 08/03/2021 1:00 AM ?Performed by: Clovis Cao, CRNA ?Pre-anesthesia Checklist: Patient identified, Emergency Drugs available, Suction available and Patient being monitored ?Patient Re-evaluated:Patient Re-evaluated prior to induction ?Oxygen Delivery Method: Circle system utilized ?Preoxygenation: Pre-oxygenation with 100% oxygen ?Induction Type: IV induction, Rapid sequence and Cricoid Pressure applied ?Laryngoscope Size: Sabra Heck and 2 ?Grade View: Grade I ?Tube type: Oral ?Tube size: 7.0 mm ?Number of attempts: 1 ?Airway Equipment and Method: Stylet ?Placement Confirmation: ETT inserted through vocal cords under direct vision, positive ETCO2 and breath sounds checked- equal and bilateral ?Secured at: 21 cm ?Tube secured with: Tape ?Dental Injury: Teeth and Oropharynx as per pre-operative assessment  ? ? ? ? ?

## 2021-08-03 NOTE — Progress Notes (Signed)

## 2021-08-03 NOTE — H&P (Signed)
?Lauren Holt is an 81 y.o. female.   ?Chief Complaint: amputation ?HPI: 81 yo female present with son states she pinched the end of left ring finger off while helping move a refrigerator yesterday evening.  Seen in ED where XR show tuft fracture with loss of distal bone.  Reports no previous injury to finger. ? ?Allergies: No Known Allergies ? ?Past Medical History:  ?Diagnosis Date  ? Anxiety   ? Breast cancer, right breast ([place])   ? Dermatitis   ? Family history of adverse reaction to anesthesia   ? "I think my sister was hard to wake up" (09/24/2017)  ? Family history of breast cancer 07/12/2017  ? Family history of prostate cancer 07/12/2017  ? Genetic testing   ? Palpitations   ? when she lays down, has not seen anyone for this, feels like a flutter  ? ? ?Past Surgical History:  ?Procedure Laterality Date  ? BREAST BIOPSY Right 06/2017  ? BREAST RECONSTRUCTION WITH PLACEMENT OF TISSUE EXPANDER AND ALLODERM Right 09/24/2017  ? BREAST RECONSTRUCTION WITH PLACEMENT OF TISSUE EXPANDER AND ALLODERM Right 09/24/2017  ? Procedure: BREAST RECONSTRUCTION WITH PLACEMENT OF TISSUE EXPANDER AND ALLODERM;  Surgeon: Irene Limbo, MD;  Location: [place];  Service: Plastics;  Laterality: Right;  ? BREAST REDUCTION SURGERY Left 01/06/2019  ? Procedure: LEFT REDUCTION  (BREAST);  Surgeon: Irene Limbo, MD;  Location: [place];  Service: Plastics;  Laterality: Left;  ? CARPAL TUNNEL RELEASE Bilateral   ? COLONOSCOPY    ? MASTECTOMY COMPLETE / SIMPLE W/ SENTINEL NODE BIOPSY Right 09/24/2017  ? MASTECTOMY W/ SENTINEL NODE BIOPSY Right 09/24/2017  ? Procedure: MASTECTOMY WITH SENTINEL LYMPH NODE BIOPSY;  Surgeon: Jovita Kussmaul, MD;  Location: [place];  Service: General;  Laterality: Right;  ? PORT-A-CATH REMOVAL Left 01/06/2019  ? Procedure: REMOVAL LEFT CHEST PORT;  Surgeon: Irene Limbo, MD;  Location: [place];  Service: Plastics;  Laterality: Left;  ? PORTACATH PLACEMENT Left 11/08/2017   ? Procedure: INSERTION PORT-A-CATH;  Surgeon: Jovita Kussmaul, MD;  Location: [place];  Service: General;  Laterality: Left;  ? REDUCTION MAMMAPLASTY Left   ? REMOVAL OF TISSUE EXPANDER AND PLACEMENT OF IMPLANT Right 01/06/2019  ? Procedure: REMOVAL OF RIGHT TISSUE EXPANDER AND PLACEMENT OF IMPLANT;  Surgeon: Irene Limbo, MD;  Location: [place];  Service: Plastics;  Laterality: Right;  ? TUBAL LIGATION    ? VAGINAL HYSTERECTOMY    ? ? ?Family History: ?Family History  ?Problem Relation Age of Onset  ? Breast cancer Paternal Aunt   ? Breast cancer Cousin 21  ? Stroke Mother   ?     caused blindness  ? Prostate cancer Father 53  ? Breast cancer Cousin 27  ? Breast cancer Cousin 25  ? Breast cancer Cousin 35  ? Breast cancer Other   ? ? ?Social History:  ? reports that she has quit smoking. Her smoking use included cigarettes. She has never used smokeless tobacco. She reports current alcohol use. She reports that she does not use drugs. ? ?Medications: ?(Not in a hospital admission) ? ? ?No results found for this or any previous visit (from the past 48 hour(s)). ? ?DG Finger Ring Left ? ?Result Date: 08/02/2021 ?CLINICAL DATA:  Crush injury. EXAM: LEFT RING FINGER 2+V COMPARISON:  None. FINDINGS: There is a soft tissue laceration at the tip of the left ring finger. Small bone fragments noted off the tip of the left ring  finger distal phalanx no subluxation or dislocation. IMPRESSION: Soft tissue laceration and small bone fragments at the tip of the left ring finger distal phalanx. Electronically Signed   By: Rolm Baptise M.D.   On: 08/02/2021 19:52   ? ? ? ?Blood pressure 134/71, pulse 71, temperature 98.6 ?F (37 ?C), resp. rate 16, height '5\' 4"'$  (1.626 m), weight 75.8 kg, SpO2 100 %. ? ?General appearance: alert, cooperative, and appears stated age ?Head: Normocephalic, without obvious abnormality, atraumatic ?Neck: supple, symmetrical, trachea midline ?Extremities: Intact sensation and capillary  refill all digits.  +epl/fpl/io. Amputation distal aspect left ring finger through end of nail. ?Pulses: 2+ and symmetric ?Skin: Skin color, texture, turgor normal. No rashes or lesions ?Neurologic: Grossly normal ?Incision/Wound: as above ? ?Assessment/Plan ?Left ring finger amputation through nail.  Recommend revision amputation in OR.  Risks, benefits and alternatives of surgery were discussed including risks of blood loss, infection, damage to nerves/vessels/tendons/ligament/bone, failure of surgery, need for additional surgery, complication with wound healing, stiffness.  She voiced understanding of these risks and elected to proceed.   ? ?Leanora Cover ?08/03/2021, 12:32 AM ? ?

## 2021-08-03 NOTE — Op Note (Signed)
NAME: Lauren Holt ?MEDICAL RECORD NO: [id number] ?DATE OF BIRTH: 28-Mar-1941 ?FACILITY: [facility] ?LOCATION: [facility] OR ?PHYSICIAN: Tennis Must, MD ?  ?OPERATIVE REPORT ?  ?DATE OF PROCEDURE: 08/03/21  ?  ?PREOPERATIVE DIAGNOSIS: Left ring finger amputation ?  ?POSTOPERATIVE DIAGNOSIS: Ring finger amputation ?  ?PROCEDURE: Revision amputation left ring finger ?  ?SURGEON:  Leanora Cover, M.D. ?  ?ASSISTANT: none ?  ?ANESTHESIA:  General ?  ?INTRAVENOUS FLUIDS:  Per anesthesia flow sheet. ?  ?ESTIMATED BLOOD LOSS:  Minimal. ?  ?COMPLICATIONS:  None. ?  ?SPECIMENS:  none ?  ?TOURNIQUET TIME:   ? ?Total Tourniquet Time Documented: ?Forearm (Left) - 21 minutes ?Total: Forearm (Left) - 21 minutes ? ?  ?DISPOSITION:  Stable to PACU. ?  ?INDICATIONS: 81 year old female states she pinched off the tip of her left ring finger while helping move a refrigerator yesterday evening.  She was seen in the emergency department where radiographs were taken revealing loss of the distal aspect of the tuft of the finger.  I recommended revision amputation in the operating room.  Risks, benefits and alternatives of surgery were discussed including the risks of blood loss, infection, damage to nerves, vessels, tendons, ligaments, bone for surgery, need for additional surgery, complications with wound healing, continued pain, stiffness.  She voiced understanding of these risks and elected to proceed. ? ?OPERATIVE COURSE:  After being identified preoperatively by myself,  the patient and I agreed on the procedure and site of the procedure.  The surgical site was marked.  Surgical consent had been signed. Preoperative IV antibiotic prophylaxis was given. She was transferred to the operating room and placed on the operating table in supine position with the Left upper extremity on an arm board.  General anesthesia was induced by the anesthesiologist.  Left upper extremity was prepped and draped in normal sterile orthopedic fashion.  A surgical  pause was performed between the surgeons, anesthesia, and operating room staff and all were in agreement as to the patient, procedure, and site of procedure.  Tourniquet at the proximal aspect of the forearm was inflated to 250 mmHg after exsanguination of the arm with an Esmarch bandage.  The wound was explored.  There was no gross contamination.  The nail was removed with a freer elevator.  There was nailbed extending beyond the end of the tuft.  This was removed sharply with a knife.  The wound was copiously irrigated with sterile saline.  The volar soft tissues were mobilized.  The tuft was shortened so that the volar soft tissues could come over the end of the bone and approximate to the nailbed dorsally.  6-0 chromic suture was used to reapproximate the soft tissues and provide coverage over the end of the bone.  The dogears at either side were removed and the soft tissues were reapproximated.  Good contour was obtained.  A digital block was performed with quarter percent plain Marcaine to aid in postoperative analgesia.  Use of Xeroform was placed in nail fold and the wounds dressed with sterile Xeroform 4 x 4 and wrapped with a Coban dressing lightly.  An AlumaFoam splint was placed and wrapped lightly with Coban dressing.  The tourniquet was deflated at 21 minutes.  Fingertips were pink with brisk capillary refill after deflation of tourniquet.  The operative  drapes were broken down.  The patient was awoken from anesthesia safely.  She was transferred back to the stretcher and taken to PACU in stable condition.  I will see her  back in the office in 1 week for postoperative followup.  I will give her a prescription for Norco 5/325 1-2 tabs PO q6 hours prn pain, dispense # 15 and Bactrim DS 1 p.o. twice daily x7 days. ? ? ?Leanora Cover, MD ?Electronically signed, 08/03/21 ?

## 2021-08-03 NOTE — Anesthesia Postprocedure Evaluation (Signed)
Anesthesia Post Note ? ?Patient: Leea Rambeau ? ?Procedure(s) Performed: AMPUTATION DIGIT (Left: Ring Finger) ? ?  ? ?Patient location during evaluation: PACU ?Anesthesia Type: General ?Level of consciousness: awake ?Pain management: pain level controlled ?Vital Signs Assessment: post-procedure vital signs reviewed and stable ?Respiratory status: spontaneous breathing, nonlabored ventilation, respiratory function stable and patient connected to nasal cannula oxygen ?Cardiovascular status: blood pressure returned to baseline and stable ?Postop Assessment: no apparent nausea or vomiting ?Anesthetic complications: no ? ? ?No notable events documented. ? ?Last Vitals:  ?Vitals:  ? 08/03/21 0210 08/03/21 0215  ?BP:  (!) 148/74  ?Pulse: 66 65  ?Resp: (!) 24 13  ?Temp:  (!) 36.1 ?C  ?SpO2: 99% 97%  ?  ?Last Pain:  ?Vitals:  ? 08/03/21 0215  ?TempSrc:   ?PainSc: 0-No pain  ? ? ?  ?  ?  ?  ?  ?  ? ?Ardis Fullwood P Khyan Oats ? ? ? ? ?

## 2021-08-03 NOTE — Transfer of Care (Signed)
Immediate Anesthesia Transfer of Care Note ? ?Patient: Lauren Holt ? ?Procedure(s) Performed: AMPUTATION DIGIT (Left: Ring Finger) ? ?Patient Location: PACU ? ?Anesthesia Type:General ? ?Level of Consciousness: drowsy ? ?Airway & Oxygen Therapy: Patient Spontanous Breathing ? ?Post-op Assessment: Report given to RN and Post -op Vital signs reviewed and stable ? ?Post vital signs: Reviewed and stable ? ?Last Vitals:  ?Vitals Value Taken Time  ?BP 132/66 08/03/21 0130  ?Temp    ?Pulse 71 08/03/21 0130  ?Resp 19 08/03/21 0130  ?SpO2 95 % 08/03/21 0130  ?Vitals shown include unvalidated device data. ? ?Last Pain:  ?Vitals:  ? 08/02/21 2048  ?TempSrc:   ?PainSc: 5   ?   ? ?  ? ?Complications: No notable events documented. ?

## 2021-08-03 NOTE — Telephone Encounter (Signed)
Contacted by patient. Pharmacy did not get Rx for post op pain meds or antibiotic.  New Rx sent via alternate system. ?

## 2021-08-04 ENCOUNTER — Encounter (HOSPITAL_COMMUNITY): Payer: Self-pay | Admitting: Orthopedic Surgery

## 2021-08-06 DIAGNOSIS — S67195A Crushing injury of left ring finger, initial encounter: Secondary | ICD-10-CM | POA: Diagnosis not present

## 2021-08-06 DIAGNOSIS — Z4889 Encounter for other specified surgical aftercare: Secondary | ICD-10-CM | POA: Diagnosis not present

## 2021-08-06 DIAGNOSIS — T148XXA Other injury of unspecified body region, initial encounter: Secondary | ICD-10-CM | POA: Diagnosis not present

## 2021-08-06 DIAGNOSIS — M79645 Pain in left finger(s): Secondary | ICD-10-CM | POA: Diagnosis not present

## 2021-08-07 DIAGNOSIS — R635 Abnormal weight gain: Secondary | ICD-10-CM | POA: Diagnosis not present

## 2021-08-07 DIAGNOSIS — R7989 Other specified abnormal findings of blood chemistry: Secondary | ICD-10-CM | POA: Diagnosis not present

## 2021-08-07 DIAGNOSIS — E663 Overweight: Secondary | ICD-10-CM | POA: Diagnosis not present

## 2021-08-07 DIAGNOSIS — Z9181 History of falling: Secondary | ICD-10-CM | POA: Diagnosis not present

## 2021-08-07 DIAGNOSIS — R7303 Prediabetes: Secondary | ICD-10-CM | POA: Diagnosis not present

## 2021-08-27 DIAGNOSIS — R7303 Prediabetes: Secondary | ICD-10-CM | POA: Diagnosis not present

## 2021-08-27 DIAGNOSIS — Z9181 History of falling: Secondary | ICD-10-CM | POA: Diagnosis not present

## 2021-08-27 DIAGNOSIS — E663 Overweight: Secondary | ICD-10-CM | POA: Diagnosis not present

## 2021-08-27 DIAGNOSIS — R635 Abnormal weight gain: Secondary | ICD-10-CM | POA: Diagnosis not present

## 2021-08-27 DIAGNOSIS — R7989 Other specified abnormal findings of blood chemistry: Secondary | ICD-10-CM | POA: Diagnosis not present

## 2021-09-09 ENCOUNTER — Ambulatory Visit: Payer: Medicare HMO | Admitting: Hematology and Oncology

## 2021-09-10 DIAGNOSIS — C50911 Malignant neoplasm of unspecified site of right female breast: Secondary | ICD-10-CM | POA: Diagnosis not present

## 2021-09-10 DIAGNOSIS — Z853 Personal history of malignant neoplasm of breast: Secondary | ICD-10-CM | POA: Diagnosis not present

## 2021-10-15 DIAGNOSIS — R7303 Prediabetes: Secondary | ICD-10-CM | POA: Diagnosis not present

## 2021-10-15 DIAGNOSIS — Z9181 History of falling: Secondary | ICD-10-CM | POA: Diagnosis not present

## 2021-10-15 DIAGNOSIS — R635 Abnormal weight gain: Secondary | ICD-10-CM | POA: Diagnosis not present

## 2021-10-15 DIAGNOSIS — R7989 Other specified abnormal findings of blood chemistry: Secondary | ICD-10-CM | POA: Diagnosis not present

## 2021-10-15 DIAGNOSIS — E663 Overweight: Secondary | ICD-10-CM | POA: Diagnosis not present

## 2021-10-29 DIAGNOSIS — N816 Rectocele: Secondary | ICD-10-CM | POA: Diagnosis not present

## 2021-10-29 DIAGNOSIS — N9089 Other specified noninflammatory disorders of vulva and perineum: Secondary | ICD-10-CM | POA: Diagnosis not present

## 2021-11-14 DIAGNOSIS — E663 Overweight: Secondary | ICD-10-CM | POA: Diagnosis not present

## 2021-11-14 DIAGNOSIS — Z9181 History of falling: Secondary | ICD-10-CM | POA: Diagnosis not present

## 2021-11-14 DIAGNOSIS — R7303 Prediabetes: Secondary | ICD-10-CM | POA: Diagnosis not present

## 2021-11-14 DIAGNOSIS — R635 Abnormal weight gain: Secondary | ICD-10-CM | POA: Diagnosis not present

## 2021-11-14 DIAGNOSIS — R7989 Other specified abnormal findings of blood chemistry: Secondary | ICD-10-CM | POA: Diagnosis not present

## 2021-11-27 ENCOUNTER — Emergency Department (HOSPITAL_BASED_OUTPATIENT_CLINIC_OR_DEPARTMENT_OTHER)
Admission: EM | Admit: 2021-11-27 | Discharge: 2021-11-27 | Disposition: A | Discharge status: Home / Self Care | Payer: Medicare HMO | Attending: Emergency Medicine | Admitting: Emergency Medicine

## 2021-11-27 ENCOUNTER — Encounter (HOSPITAL_BASED_OUTPATIENT_CLINIC_OR_DEPARTMENT_OTHER): Payer: Self-pay | Admitting: Emergency Medicine

## 2021-11-27 ENCOUNTER — Other Ambulatory Visit: Payer: Self-pay

## 2021-11-27 ENCOUNTER — Emergency Department (HOSPITAL_BASED_OUTPATIENT_CLINIC_OR_DEPARTMENT_OTHER): Payer: Medicare HMO | Admitting: Radiology

## 2021-11-27 DIAGNOSIS — I493 Ventricular premature depolarization: Secondary | ICD-10-CM | POA: Insufficient documentation

## 2021-11-27 DIAGNOSIS — Z87891 Personal history of nicotine dependence: Secondary | ICD-10-CM | POA: Insufficient documentation

## 2021-11-27 DIAGNOSIS — R079 Chest pain, unspecified: Secondary | ICD-10-CM | POA: Diagnosis not present

## 2021-11-27 DIAGNOSIS — R0789 Other chest pain: Secondary | ICD-10-CM | POA: Diagnosis not present

## 2021-11-27 DIAGNOSIS — Z79899 Other long term (current) drug therapy: Secondary | ICD-10-CM | POA: Insufficient documentation

## 2021-11-27 DIAGNOSIS — Z853 Personal history of malignant neoplasm of breast: Secondary | ICD-10-CM | POA: Diagnosis not present

## 2021-11-27 LAB — BASIC METABOLIC PANEL
Anion gap: 7 (ref 5–15)
BUN: 17 mg/dL (ref 8–23)
CO2: 29 mmol/L (ref 22–32)
Calcium: 9 mg/dL (ref 8.9–10.3)
Chloride: 99 mmol/L (ref 98–111)
Creatinine, Ser: 0.74 mg/dL (ref 0.44–1.00)
GFR, Estimated: >60 mL/min (ref >60)
Glucose, Bld: 135 mg/dL — ABNORMAL HIGH (ref 70–99)
Potassium: 4 mmol/L (ref 3.5–5.1)
Sodium: 135 mmol/L (ref 135–145)

## 2021-11-27 LAB — CBC WITH DIFFERENTIAL/PLATELET
Abs Immature Granulocytes: 0.01 10*3/uL (ref 0.00–0.07)
Basophils Absolute: 0 10*3/uL (ref 0.0–0.1)
Basophils Relative: 0 %
Eosinophils Absolute: 0.1 10*3/uL (ref 0.0–0.5)
Eosinophils Relative: 1 %
HCT: 39.8 % (ref 36.0–46.0)
Hemoglobin: 13.1 g/dL (ref 12.0–15.0)
Immature Granulocytes: 0 %
Lymphocytes Relative: 44 %
Lymphs Abs: 2.6 10*3/uL (ref 0.7–4.0)
MCH: 32.5 pg (ref 26.0–34.0)
MCHC: 32.9 g/dL (ref 30.0–36.0)
MCV: 98.8 fL (ref 80.0–100.0)
Monocytes Absolute: 0.4 10*3/uL (ref 0.1–1.0)
Monocytes Relative: 7 %
Neutro Abs: 2.8 10*3/uL (ref 1.7–7.7)
Neutrophils Relative %: 48 %
Platelets: 234 10*3/uL (ref 150–400)
RBC: 4.03 MIL/uL (ref 3.87–5.11)
RDW: 12.2 % (ref 11.5–15.5)
WBC: 6 10*3/uL (ref 4.0–10.5)
nRBC: 0 % (ref 0.0–0.2)

## 2021-11-27 LAB — TROPONIN I (HIGH SENSITIVITY)
Troponin I (High Sensitivity): 7 ng/L (ref <18)
Troponin I (High Sensitivity): 6 ng/L (ref <18)

## 2021-11-27 NOTE — ED Notes (Signed)
Pt oob ambulatory to hall bathroom independently; gait steady; no distress

## 2021-11-27 NOTE — ED Notes (Signed)
Patient transported to X-ray via stretcher at this time - no acute changes noted

## 2021-11-27 NOTE — ED Notes (Signed)
Pt agreeable with d/c plan as discussed by provider- this nurse has verbally reinforced d/c instructions and provided pt with written copy - pt acknowledges verbal understanding and denies any additional questions, concerns, needs - pt ambulatory independently at d/c with steady gait; vitals stable- accompanied home by son

## 2021-11-27 NOTE — ED Notes (Signed)
Pt now returned from xray dept via stretcher; pt remains awake and alert- no acute distress or changes observed

## 2021-11-27 NOTE — ED Notes (Signed)
Pt son to the nurses station - reports cp episodes again '@0306hrs'$  and '@0310hrs'$ 

## 2021-11-27 NOTE — ED Provider Notes (Signed)
[facility] Provider Note: Georgena Spurling, MD, FACEP  CSN: [id number] MRN: [id number] ARRIVAL: 11/27/21 at [place]: [id number]   CHIEF COMPLAINT  Chest Pain   HISTORY OF PRESENT ILLNESS  11/27/21 2:11 AM Lauren Holt is a 81 y.o. female who has been having chest pain since yesterday afternoon about 4 PM.  The chest pain is described as a sharp, brief (a second) pain in the left side of her chest.  Nothing makes the symptoms better or worse.  Symptoms are moderate.  There is no associated shortness of breath, diaphoresis or nausea.  They occur as infrequently as once an hour or as frequently as several times a minute.  She has not had any in at least 30 minutes.   Past Medical History:  Diagnosis Date   Anxiety    Breast cancer, right breast ([place])    Dermatitis    Palpitations    when she lays down, has not seen anyone for this, feels like a flutter    Past Surgical History:  Procedure Laterality Date   AMPUTATION Left 08/02/2021   Procedure: AMPUTATION DIGIT;  Surgeon: Leanora Cover, MD;  Location: [place];  Service: Orthopedics;  Laterality: Left;   BREAST BIOPSY Right 06/2017   BREAST RECONSTRUCTION WITH PLACEMENT OF TISSUE EXPANDER AND ALLODERM Right 09/24/2017   BREAST RECONSTRUCTION WITH PLACEMENT OF TISSUE EXPANDER AND ALLODERM Right 09/24/2017   Procedure: BREAST RECONSTRUCTION WITH PLACEMENT OF TISSUE EXPANDER AND ALLODERM;  Surgeon: Irene Limbo, MD;  Location: [place];  Service: Plastics;  Laterality: Right;   BREAST REDUCTION SURGERY Left 01/06/2019   Procedure: LEFT REDUCTION  (BREAST);  Surgeon: Irene Limbo, MD;  Location: [place];  Service: Plastics;  Laterality: Left;   CARPAL TUNNEL RELEASE Bilateral    COLONOSCOPY     MASTECTOMY COMPLETE / SIMPLE W/ SENTINEL NODE BIOPSY Right 09/24/2017   MASTECTOMY W/ SENTINEL NODE BIOPSY Right 09/24/2017   Procedure: MASTECTOMY WITH SENTINEL LYMPH NODE BIOPSY;  Surgeon: Jovita Kussmaul, MD;   Location: [place];  Service: General;  Laterality: Right;   PORT-A-CATH REMOVAL Left 01/06/2019   Procedure: REMOVAL LEFT CHEST PORT;  Surgeon: Irene Limbo, MD;  Location: [place];  Service: Plastics;  Laterality: Left;   PORTACATH PLACEMENT Left 11/08/2017   Procedure: INSERTION PORT-A-CATH;  Surgeon: Jovita Kussmaul, MD;  Location: [place];  Service: General;  Laterality: Left;   REDUCTION MAMMAPLASTY Left    REMOVAL OF TISSUE EXPANDER AND PLACEMENT OF IMPLANT Right 01/06/2019   Procedure: REMOVAL OF RIGHT TISSUE EXPANDER AND PLACEMENT OF IMPLANT;  Surgeon: Irene Limbo, MD;  Location: [place];  Service: Plastics;  Laterality: Right;   TUBAL LIGATION     VAGINAL HYSTERECTOMY      Family History  Problem Relation Age of Onset   Breast cancer Paternal Aunt    Breast cancer Cousin 34   Stroke Mother        caused blindness   Prostate cancer Father 56   Breast cancer Cousin 79   Breast cancer Cousin 25   Breast cancer Cousin 35   Breast cancer Other     Social History   Tobacco Use   Smoking status: Former    Years: 1.00    Types: Cigarettes   Smokeless tobacco: Never   Tobacco comments:    Smoked X 1 year at age 42 (less than ppd)  Vaping Use   Vaping Use: Never used  Substance Use Topics  Alcohol use: Yes    Comment: social   Drug use: Never    Prior to Admission medications   Medication Sig Start Date End Date Taking? Authorizing Provider  ALPRAZolam (XANAX) 0.25 MG tablet Take 0.125 mg by mouth 2 (two) times daily as needed for anxiety.  08/03/17   [provider]  BETA CAROTENE PO Take 7,500 mcg by mouth daily.    [provider]  calcium carbonate (CALCIUM 600) 600 MG TABS tablet Take 600 mg by mouth daily.    [provider]  Cholecalciferol (VITAMIN D-3) 5000 units TABS Take 1 tablet by mouth daily.    [provider]  Lutein 20 MG CAPS Take 20 mg by mouth daily.     [provider]  Multiple Vitamins-Minerals (ICAPS AREDS FORMULA PO) Take 1 capsule by mouth 3 (three) times a week.     [provider]  Probiotic Product (PROBIOTIC DAILY PO) Take 1 capsule by mouth 3 (three) times a week.     [provider]  Red Yeast Rice 600 MG CAPS Take 600 mg by mouth daily.     [provider]  tamoxifen (NOLVADEX) 20 MG tablet Take 1 tablet (20 mg total) by mouth daily. 04/01/21   Magrinat, Virgie Dad, MD  VITAMIN E PO Take 180 mg by mouth daily.    [provider]  zinc gluconate 50 MG tablet Take 50 mg by mouth daily as needed (cold prevention).    [provider]    Allergies Patient has no known allergies.   REVIEW OF SYSTEMS  Negative except as noted here or in the History of Present Illness.   PHYSICAL EXAMINATION  Initial Vital Signs Blood pressure (!) 150/85, pulse 73, temperature 98.7 F (37.1 C), temperature source Oral, resp. rate 16, SpO2 100 %.  Examination General: Well-developed, well-nourished female in no acute distress; appearance consistent with age of record HENT: normocephalic; atraumatic Eyes: Normal appearance Neck: supple Heart: regular rate and rhythm; no murmur Lungs: clear to auscultation bilaterally Abdomen: soft; nondistended; nontender; bowel sounds present Extremities: No deformity; full range of motion; pulses normal Neurologic: Awake, alert and oriented; motor function intact in all extremities and symmetric; no facial droop Skin: Warm and dry Psychiatric: Normal mood and affect   RESULTS  Summary of this visit's results, reviewed and interpreted by myself:   EKG Interpretation  Date/Time:  Thursday November 27 2021 02:04:19 EDT Ventricular Rate:  80 PR Interval:  163 QRS Duration: 85 QT Interval:  395 QTC Calculation: 456 R Axis:   46 Text Interpretation: Sinus rhythm Normal ECG No significant change was found Confirmed by Shanon Rosser [phone]) on 11/27/2021 2:09:52 AM        Laboratory Studies: Results for orders placed or performed during the hospital encounter of 11/27/21 (from the past 24 hour(s))  CBC with Differential     Status: None   Collection Time: 11/27/21  2:20 AM  Result Value Ref Range   WBC 6.0 4.0 - 10.5 K/uL   RBC 4.03 3.87 - 5.11 MIL/uL   Hemoglobin 13.1 12.0 - 15.0 g/dL   HCT 39.8 36.0 - 46.0 %   MCV 98.8 80.0 - 100.0 fL   MCH 32.5 26.0 - 34.0 pg   MCHC 32.9 30.0 - 36.0 g/dL   RDW 12.2 11.5 - 15.5 %   Platelets 234 150 - 400 K/uL   nRBC 0.0 0.0 - 0.2 %   Neutrophils Relative % 48 %   Neutro  Abs 2.8 1.7 - 7.7 K/uL   Lymphocytes Relative 44 %   Lymphs Abs 2.6 0.7 - 4.0 K/uL   Monocytes Relative 7 %   Monocytes Absolute 0.4 0.1 - 1.0 K/uL   Eosinophils Relative 1 %   Eosinophils Absolute 0.1 0.0 - 0.5 K/uL   Basophils Relative 0 %   Basophils Absolute 0.0 0.0 - 0.1 K/uL   Immature Granulocytes 0 %   Abs Immature Granulocytes 0.01 0.00 - 0.07 K/uL  Troponin I (High Sensitivity)     Status: None   Collection Time: 11/27/21  2:20 AM  Result Value Ref Range   Troponin I (High Sensitivity) 7 <18 ng/L  Basic metabolic panel     Status: Abnormal   Collection Time: 11/27/21  2:20 AM  Result Value Ref Range   Sodium 135 135 - 145 mmol/L   Potassium 4.0 3.5 - 5.1 mmol/L   Chloride 99 98 - 111 mmol/L   CO2 29 22 - 32 mmol/L   Glucose, Bld 135 (H) 70 - 99 mg/dL   BUN 17 8 - 23 mg/dL   Creatinine, Ser 0.74 0.44 - 1.00 mg/dL   Calcium 9.0 8.9 - 10.3 mg/dL   GFR, Estimated >60 >60 mL/min   Anion gap 7 5 - 15  Troponin I (High Sensitivity)     Status: None   Collection Time: 11/27/21  4:16 AM  Result Value Ref Range   Troponin I (High Sensitivity) 6 <18 ng/L   Imaging Studies: DG Chest 2 View  Result Date: 11/27/2021 CLINICAL DATA:  Chest pain for several hours EXAM: CHEST - 2 VIEW COMPARISON:  11/14/2019 FINDINGS: Cardiac shadow is within normal limits. The lungs are clear. Right-sided breast implant is noted. No acute bony  abnormality is noted. IMPRESSION: No active cardiopulmonary disease. Electronically Signed   By: Inez Catalina M.D.   On: 11/27/2021 02:40    ED COURSE and MDM  Nursing notes, initial and subsequent vitals signs, including pulse oximetry, reviewed and interpreted by myself.  Vitals:   11/27/21 0207 11/27/21 0215 11/27/21 0520  BP:  (!) 150/85 (!) 130/54  Pulse:  73 69  Resp:  16 15  Temp: 98.7 F (37.1 C)    TempSrc: Oral    SpO2:  100% 100%   Medications - No data to display  5:30 AM The patient has had only a few brief (1 second) episodes of pain while in the department.  Her rhythm strip was reviewed and it showed 2 isolated PVCs.  It could be that the patient is perceiving these PVCs as painful episodes.  They are not in and of themselves dangerous and there was no sustained arrhythmias.  We will refer to cardiology for further evaluation but I do not believe further work-up in the emergency department is indicated.  Her troponins are normal as is her EKG.   PROCEDURES  Procedures   ED DIAGNOSES     ICD-10-CM   1. PVC's (premature ventricular contractions)  I49.3 Ambulatory referral to Cardiology    2. Atypical chest pain  R07.89          Shanon Rosser, MD 11/27/21 [phone]

## 2021-11-27 NOTE — ED Triage Notes (Signed)
Chest pain started around 4pm, intermittent. Worse and more frequents through the night. More fatigue, DOE, denies N/v

## 2021-11-27 NOTE — ED Notes (Signed)
Upon entering room to re-assess patient; patient reporting a 3rd nonsustained episode of sharp, chest pain lasting 1-2 seconds.  Dr. Florina Ou notified.  Review of  remote cardiac monitoring strip does show PAC and ?PVC however times do not correlate with cp reporting.  Pt given water at this time per request, cleared by Dr. Florina Ou

## 2021-12-16 DIAGNOSIS — R635 Abnormal weight gain: Secondary | ICD-10-CM | POA: Diagnosis not present

## 2021-12-16 DIAGNOSIS — R7303 Prediabetes: Secondary | ICD-10-CM | POA: Diagnosis not present

## 2021-12-16 DIAGNOSIS — R7989 Other specified abnormal findings of blood chemistry: Secondary | ICD-10-CM | POA: Diagnosis not present

## 2021-12-16 DIAGNOSIS — Z780 Asymptomatic menopausal state: Secondary | ICD-10-CM | POA: Diagnosis not present

## 2021-12-16 DIAGNOSIS — Z9181 History of falling: Secondary | ICD-10-CM | POA: Diagnosis not present

## 2021-12-16 DIAGNOSIS — E663 Overweight: Secondary | ICD-10-CM | POA: Diagnosis not present

## 2021-12-18 ENCOUNTER — Ambulatory Visit (HOSPITAL_BASED_OUTPATIENT_CLINIC_OR_DEPARTMENT_OTHER): Payer: Medicare HMO | Admitting: Cardiology

## 2021-12-18 ENCOUNTER — Encounter (HOSPITAL_BASED_OUTPATIENT_CLINIC_OR_DEPARTMENT_OTHER): Payer: Self-pay | Admitting: Cardiology

## 2021-12-18 ENCOUNTER — Ambulatory Visit: Payer: Medicare HMO | Attending: Cardiology

## 2021-12-18 VITALS — BP 138/66 | HR 71 | Ht 62.0 in | Wt 156.1 lb

## 2021-12-18 DIAGNOSIS — Z7189 Other specified counseling: Secondary | ICD-10-CM

## 2021-12-18 DIAGNOSIS — R002 Palpitations: Secondary | ICD-10-CM

## 2021-12-18 DIAGNOSIS — Z79899 Other long term (current) drug therapy: Secondary | ICD-10-CM | POA: Diagnosis not present

## 2021-12-18 DIAGNOSIS — R0789 Other chest pain: Secondary | ICD-10-CM | POA: Diagnosis not present

## 2021-12-18 DIAGNOSIS — I493 Ventricular premature depolarization: Secondary | ICD-10-CM

## 2021-12-18 DIAGNOSIS — Z8249 Family history of ischemic heart disease and other diseases of the circulatory system: Secondary | ICD-10-CM | POA: Diagnosis not present

## 2021-12-18 DIAGNOSIS — Z13228 Encounter for screening for other metabolic disorders: Secondary | ICD-10-CM | POA: Diagnosis not present

## 2021-12-18 NOTE — Patient Instructions (Signed)
Medication Instructions:  Your Physician recommend you continue on your current medication as directed.    *If you need a refill on your cardiac medications before your next appointment, please call your pharmacy*   Lab Work: Your provider has recommended lab work today (Lipid, LPa) Please have this collected at [facility] at [place]. The lab is open 8:00 am - 4:30 pm. Please avoid 12:00p - 1:00p for lunch hour. You do not need an appointment. Please go to [address] [place], [place] [address]. This is in the Primary Care office on the 3rd floor, let them know you are there for blood work and they will direct you to the lab.  If you have labs (blood work) drawn today and your tests are completely normal, you will receive your results only by: [place] (if you have MyChart) OR A paper copy in the mail If you have any lab test that is abnormal or we need to change your treatment, we will call you to review the results.   Testing/Procedures: Your physician has recommended that you wear a Zio monitor.   This monitor is a medical device that records the heart's electrical activity. Doctors most often use these monitors to diagnose arrhythmias. Arrhythmias are problems with the speed or rhythm of the heartbeat. The monitor is a small device applied to your chest. You can wear one while you do your normal daily activities. While wearing this monitor if you have any symptoms to push the button and record what you felt. Once you have worn this monitor for the period of time provider prescribed (Usually 14 days), you will return the monitor device in the postage paid box. Once it is returned they will download the data collected and provide [place] with a report which the provider will then review and we will call you with those results. Important tips:  Avoid showering during the first 24 hours of wearing the monitor. Avoid excessive sweating to help maximize wear  time. Do not submerge the device, no hot tubs, and no swimming pools. Keep any lotions or oils away from the patch. After 24 hours you may shower with the patch on. Take brief showers with your back facing the shower head.  Do not remove patch once it has been placed because that will interrupt data and decrease adhesive wear time. Push the button when you have any symptoms and write down what you were feeling. Once you have completed wearing your monitor, remove and place into box which has postage paid and place in your outgoing mailbox.  If for some reason you have misplaced your box then call our office and we can provide another box and/or mail it off for you.    CT coronary calcium score.   Test locations:  [place]   This is $99 out of pocket.   Coronary CalciumScan A coronary calcium scan is an imaging test used to look for deposits of calcium and other fatty materials (plaques) in the inner lining of the blood vessels of the heart (coronary arteries). These deposits of calcium and plaques can partly clog and narrow the coronary arteries without producing any symptoms or warning signs. This puts a person at risk for a heart attack. This test can detect these deposits before symptoms develop. Tell a health care provider about: Any allergies you have. All medicines you are taking, including vitamins, herbs, eye drops, creams, and over-the-counter medicines. Any problems you or family members have had with  anesthetic medicines. Any blood disorders you have. Any surgeries you have had. Any medical conditions you have. Whether you are pregnant or may be pregnant. What are the risks? Generally, this is a safe procedure. However, problems may occur, including: Harm to a pregnant woman and her unborn baby. This test involves the use of radiation. Radiation exposure can be dangerous to a pregnant woman and her unborn baby. If you are pregnant, you generally should not have  this procedure done. Slight increase in the risk of cancer. This is because of the radiation involved in the test. What happens before the procedure? No preparation is needed for this procedure. What happens during the procedure? You will undress and remove any jewelry around your neck or chest. You will put on a hospital gown. Sticky electrodes will be placed on your chest. The electrodes will be connected to an electrocardiogram (ECG) machine to record a tracing of the electrical activity of your heart. A CT scanner will take pictures of your heart. During this time, you will be asked to lie still and hold your breath for 2-3 seconds while a picture of your heart is being taken. The procedure may vary among health care providers and hospitals. What happens after the procedure? You can get dressed. You can return to your normal activities. It is up to you to get the results of your test. Ask your health care provider, or the department that is doing the test, when your results will be ready. Summary A coronary calcium scan is an imaging test used to look for deposits of calcium and other fatty materials (plaques) in the inner lining of the blood vessels of the heart (coronary arteries). Generally, this is a safe procedure. Tell your health care provider if you are pregnant or may be pregnant. No preparation is needed for this procedure. A CT scanner will take pictures of your heart. You can return to your normal activities after the scan is done. This information is not intended to replace advice given to you by your health care provider. Make sure you discuss any questions you have with your health care provider. Document Released: 09/26/2007 Document Revised: 02/17/2016 Document Reviewed: 02/17/2016 Elsevier Interactive Patient Education  2017 [place]: At [facility], you and your health needs are our priority.  As part of our continuing mission to provide  you with exceptional heart care, we have created designated Provider Care Teams.  These Care Teams include your primary Cardiologist (physician) and Advanced Practice Providers (APPs -  Physician Assistants and Nurse Practitioners) who all work together to provide you with the care you need, when you need it.  We recommend signing up for the patient portal called "MyChart".  Sign up information is provided on this After Visit Summary.  MyChart is used to connect with patients for Virtual Visits (Telemedicine).  Patients are able to view lab/test results, encounter notes, upcoming appointments, etc.  Non-urgent messages can be sent to your provider as well.   To learn more about what you can do with MyChart, go to [email or web address].    Your next appointment:   6 week(s)  The format for your next appointment:   In Person  Provider:   Buford Dresser, MD

## 2021-12-18 NOTE — Progress Notes (Signed)
Cardiology Office Note:    Date:  12/18/2021   ID:  Lauren Holt, DOB [birth year], MRN [id number]  PCP:  Alroy Dust, Carlean Jews.Marlou Sa, MD  Cardiologist:  Buford Dresser, MD  Referring MD: Shanon Rosser, MD   CC: new patient evaluation for palpitations/atypical chest pain  History of Present Illness:    Lauren Holt is a 81 y.o. female with a hx of anxiety and right breast cancer, who is seen as a new consult at the request of Molpus, John, MD for the evaluation and management of PVC's.  She presented to the ED 11/27/2021 with concerns for sharp left chest pain ongoing intermittently since the prior afternoon. Troponins were normal. Her EKG showed sinus rhythm at 80 bpm. Her rhythm strip showed no sustained arrhythmias but there were 2 isolated PVCs. It was thought she may be perceiving these PVCs as painful episodes. She was referred to outpatient cardiology for further evaluation.  Cardiovascular risk factors: Prior clinical ASCVD: none Comorbid conditions: Told she had high cholesterol years ago, but improved with weight loss. Metabolic syndrome/Obesity: Highest adult weight 180 lbs, currently she is 156 lbs. Recently she went to a weight loss specialist who recommended taking 75 mg diethylpropion, but she was taking 1/2 tablet (37.5 mg). However, she has not taken this in the past few weeks. Since April she has lost 21 lbs. Tobacco use history: Smoked for a year when she was 69. Otherwise no smoking. Family history: Her younger sister had a heart attack a few months ago. Her brother died of a heart attack at 80 yo. Her mother denied heart issues but was on prn nitroglycerin. She believes her father had heart issues as well. Her son had a heart attack in his 67's, has type 1 diabetes. Prior cardiac testing and/or incidental findings on other testing (ie coronary calcium): Exercise level: Lately she has been swimming at least 3 times a week. They have a pool at their retirement community.  She also uses weights while in the water. She denies any anginal symptoms. Current diet: Previously she had irregular heartbeats, and was told to cut down on coffee. She is now drinking 1 cup of coffee a day. She might drink 3-4 glasses of alcohol with social occasions. Otherwise up to 2 glasses a week.  Prior to her recent ED visit around 1-1:30 AM, she was getting ready for bed and began having sharp chest pains in her left breast. This kind of pain had occurred in the past, but this was different as the pain occurred 4-5 times within a period of 30 minutes which was atypical. She took an aspirin at the time. Her pain had stopped by the time she was at the ED. Every once in a while she will also feel chest pressure. The other day she noted some chest pain in her right side, which was atypical.  She endorses "a lot" of fluttering, sometimes feeling a stronger heart beat. Her palpitations have been intermittent for a long time. Most often this occurs when she is first lying down at night.  She denies any shortness of breath, or peripheral edema. No headaches, syncope, orthopnea, or PND.  Past Medical History:  Diagnosis Date   Anxiety    Breast cancer, right breast ([place])    Dermatitis    Palpitations    when she lays down, has not seen anyone for this, feels like a flutter    Past Surgical History:  Procedure Laterality Date   AMPUTATION Left 08/02/2021   Procedure: AMPUTATION  DIGIT;  Surgeon: Leanora Cover, MD;  Location: [facility];  Service: Orthopedics;  Laterality: Left;   BREAST BIOPSY Right 06/2017   BREAST RECONSTRUCTION WITH PLACEMENT OF TISSUE EXPANDER AND ALLODERM Right 09/24/2017   BREAST RECONSTRUCTION WITH PLACEMENT OF TISSUE EXPANDER AND ALLODERM Right 09/24/2017   Procedure: BREAST RECONSTRUCTION WITH PLACEMENT OF TISSUE EXPANDER AND ALLODERM;  Surgeon: Irene Limbo, MD;  Location: [facility];  Service: Plastics;  Laterality: Right;   BREAST REDUCTION SURGERY Left 01/06/2019    Procedure: LEFT REDUCTION  (BREAST);  Surgeon: Irene Limbo, MD;  Location: [facility];  Service: Plastics;  Laterality: Left;   CARPAL TUNNEL RELEASE Bilateral    COLONOSCOPY     MASTECTOMY COMPLETE / SIMPLE W/ SENTINEL NODE BIOPSY Right 09/24/2017   MASTECTOMY W/ SENTINEL NODE BIOPSY Right 09/24/2017   Procedure: MASTECTOMY WITH SENTINEL LYMPH NODE BIOPSY;  Surgeon: Jovita Kussmaul, MD;  Location: [facility];  Service: General;  Laterality: Right;   PORT-A-CATH REMOVAL Left 01/06/2019   Procedure: REMOVAL LEFT CHEST PORT;  Surgeon: Irene Limbo, MD;  Location: [facility];  Service: Plastics;  Laterality: Left;   PORTACATH PLACEMENT Left 11/08/2017   Procedure: INSERTION PORT-A-CATH;  Surgeon: Jovita Kussmaul, MD;  Location: [facility];  Service: General;  Laterality: Left;   REDUCTION MAMMAPLASTY Left    REMOVAL OF TISSUE EXPANDER AND PLACEMENT OF IMPLANT Right 01/06/2019   Procedure: REMOVAL OF RIGHT TISSUE EXPANDER AND PLACEMENT OF IMPLANT;  Surgeon: Irene Limbo, MD;  Location: [facility];  Service: Plastics;  Laterality: Right;   TUBAL LIGATION     VAGINAL HYSTERECTOMY      Current Medications: Current Outpatient Medications on File Prior to Visit  Medication Sig   ALPRAZolam (XANAX) 0.25 MG tablet Take 0.125 mg by mouth 2 (two) times daily as needed for anxiety.    BETA CAROTENE PO Take 7,500 mcg by mouth daily.   calcium carbonate (CALCIUM 600) 600 MG TABS tablet Take 600 mg by mouth daily.   Cholecalciferol (VITAMIN D-3) 5000 units TABS Take 1 tablet by mouth daily.   Lutein 20 MG CAPS Take 20 mg by mouth daily.    Multiple Vitamins-Minerals (ICAPS AREDS FORMULA PO) Take 1 capsule by mouth 3 (three) times a week.    Probiotic Product (PROBIOTIC DAILY PO) Take 1 capsule by mouth 3 (three) times a week.    Red Yeast Rice 600 MG CAPS Take 600 mg by mouth daily.    tamoxifen (NOLVADEX) 20 MG tablet Take 1 tablet (20 mg total) by mouth  daily.   VITAMIN E PO Take 180 mg by mouth daily.   zinc gluconate 50 MG tablet Take 50 mg by mouth daily as needed (cold prevention).   No current facility-administered medications on file prior to visit.     Allergies:   Patient has no known allergies.   Social History   Tobacco Use   Smoking status: Former    Years: 1.00    Types: Cigarettes   Smokeless tobacco: Never   Tobacco comments:    Smoked X 1 year at age 44 (less than ppd)  Vaping Use   Vaping Use: Never used  Substance Use Topics   Alcohol use: Yes    Comment: social   Drug use: Never    Family History: family history includes Breast cancer in her paternal aunt and another family member; Breast cancer (age of onset: 40) in her cousin; Breast cancer (age of onset: 8) in her  cousin; Breast cancer (age of onset: 35) in her cousin; Breast cancer (age of onset: 47) in her cousin; Prostate cancer (age of onset: 83) in her father; Stroke in her mother.  ROS:   Please see the history of present illness.  Additional pertinent ROS: Constitutional: Negative for chills, fever, night sweats, unintentional weight loss  HENT: Negative for ear pain. Positive for hard of hearing.   Eyes: Negative for loss of vision and eye pain.  Respiratory: Negative for cough, sputum, wheezing.   Cardiovascular: See HPI. Gastrointestinal: Negative for abdominal pain, melena, and hematochezia.  Genitourinary: Negative for dysuria and hematuria.  Musculoskeletal: Negative for falls and myalgias.  Skin: Negative for itching and rash.  Neurological: Negative for focal weakness, focal sensory changes and loss of consciousness.  Endo/Heme/Allergies: Does not bruise/bleed easily.     EKGs/Labs/Other Studies Reviewed:    The following studies were reviewed today:  Chest X-ray  11/27/2021: FINDINGS: Cardiac shadow is within normal limits. The lungs are clear. Right-sided breast implant is noted. No acute bony abnormality is noted.    IMPRESSION: No active cardiopulmonary disease.  EKG:  EKG is personally reviewed.   12/18/2021:  NSR at 71 bpm  Recent Labs: 11/27/2021: BUN 17; Creatinine, Ser 0.74; Hemoglobin 13.1; Platelets 234; Potassium 4.0; Sodium 135   Recent Lipid Panel No results found for: "CHOL", "TRIG", "HDL", "CHOLHDL", "VLDL", "LDLCALC", "LDLDIRECT"  Physical Exam:    VS:  BP 138/66 (BP Location: Left Arm, Patient Position: Sitting, Cuff Size: Normal)   Pulse 71   Ht '5\' 2"'$  (1.575 m)   Wt 156 lb 1.6 oz (70.8 kg)   BMI 28.55 kg/m     Wt Readings from Last 3 Encounters:  12/18/21 156 lb 1.6 oz (70.8 kg)  08/02/21 167 lb (75.8 kg)  04/01/21 177 lb 8 oz (80.5 kg)    GEN: Well nourished, well developed in no acute distress HEENT: Normal, moist mucous membranes NECK: No JVD CARDIAC: regular rhythm, normal S1 and S2, no rubs or gallops. No murmur. VASCULAR: Radial and DP pulses 2+ bilaterally. No carotid bruits RESPIRATORY:  Clear to auscultation without rales, wheezing or rhonchi  ABDOMEN: Soft, non-tender, non-distended MUSCULOSKELETAL:  Ambulates independently SKIN: Warm and dry, no edema NEUROLOGIC:  Alert and oriented x 3. No focal neuro deficits noted. PSYCHIATRIC:  Normal affect    ASSESSMENT:    1. PVC (premature ventricular contraction)   2. Heart palpitations   3. Family history of heart disease   4. Cardiac risk counseling   5. Medication management   6. Atypical chest pain    PLAN:    Palpitations, PVCs noted on telemetry in ER -we discussed potential causes of palpitations today -discussed monitor, will proceed with 2 week Zio. She will place this herself once her pool closes for the season in about 10 days -recent ER workup reassuring  Atypical chest pain Family history of heart disease -reviewed ER workup. Reassuring that hsTn unremarkable -she does have a strong family history in both her siblings and her son (son has type I diabetes but had cardiac arrest/MI in his  71s). -we discussed options for evaluation.  -we reviewed the pros/cons of calcium scores. A cardiac CT scan for coronary calcium is a non-invasive way of obtaining information about the presence, location and extent of calcified plaque in the coronary arteries--the vessels that supply oxygen-containing blood to the heart muscle. Calcified plaque results when there is a build-up of fat and other substances under the inner layer of  the artery. This material can calcify which signals the presence of atherosclerosis, a disease of the vessel wall, also called coronary artery disease.  People with this disease have an increased risk for heart attacks. In addition, over time, progression of plaque build up (CAD) can narrow the arteries or even close off blood flow to the heart. Because calcium is a marker of CAD, the amount of calcium detected on a cardiac CT scan is a helpful prognostic tool.  -we reviewed the charts together which show the relationship between calcium score and 15 year all cause mortality -after shared decision making, will proceed with coronary calcium score. They understand this is an out of pocket/self pay test currently costing $99. -if calcium score nonzero, we did preliminarily discuss statin and the data for benefit  -will also order lp(a) given LDL of 84 but strong family history -reviewed red flag warning signs that need immediate medical attention  Cardiac risk counseling and prevention recommendations: -recommend heart healthy/Mediterranean diet, with whole grains, fruits, vegetable, fish, lean meats, nuts, and olive oil. Limit salt. -recommend moderate walking, 3-5 times/week for 30-50 minutes each session. Aim for at least 150 minutes.week. Goal should be pace of 3 miles/hours, or walking 1.5 miles in 30 minutes -recommend avoidance of tobacco products. Avoid excess alcohol. -ASCVD risk score: The ASCVD Risk score (Arnett DK, et al., 2019) failed to calculate for the following  reasons:   Cannot find a previous HDL lab   Cannot find a previous total cholesterol lab    Plan for follow up: 6 weeks or sooner as needed.  Buford Dresser, MD, PhD, [facility]    Medication Adjustments/Labs and Tests Ordered: Current medicines are reviewed at length with the patient today.  Concerns regarding medicines are outlined above.   Orders Placed This Encounter  Procedures   CT CARDIAC SCORING (SELF PAY ONLY)   Lipid panel   Lipoprotein A (LPA)   LONG TERM MONITOR (3-14 DAYS)   EKG 12-Lead   No orders of the defined types were placed in this encounter.  Patient Instructions  Medication Instructions:  Your Physician recommend you continue on your current medication as directed.    *If you need a refill on your cardiac medications before your next appointment, please call your pharmacy*   Lab Work: Your provider has recommended lab work today (Lipid, LPa) Please have this collected at [facility] at [place]. The lab is open 8:00 am - 4:30 pm. Please avoid 12:00p - 1:00p for lunch hour. You do not need an appointment. Please go to [address] [place], [place] [address]. This is in the Primary Care office on the 3rd floor, let them know you are there for blood work and they will direct you to the lab.  If you have labs (blood work) drawn today and your tests are completely normal, you will receive your results only by: [place] (if you have MyChart) OR A paper copy in the mail If you have any lab test that is abnormal or we need to change your treatment, we will call you to review the results.   Testing/Procedures: Your physician has recommended that you wear a Zio monitor.   This monitor is a medical device that records the heart's electrical activity. Doctors most often use these monitors to diagnose arrhythmias. Arrhythmias are problems with the speed or rhythm of the heartbeat. The monitor is a small  device applied to your chest. You can wear  one while you do your normal daily activities. While wearing this monitor if you have any symptoms to push the button and record what you felt. Once you have worn this monitor for the period of time provider prescribed (Usually 14 days), you will return the monitor device in the postage paid box. Once it is returned they will download the data collected and provide [place] with a report which the provider will then review and we will call you with those results. Important tips:  Avoid showering during the first 24 hours of wearing the monitor. Avoid excessive sweating to help maximize wear time. Do not submerge the device, no hot tubs, and no swimming pools. Keep any lotions or oils away from the patch. After 24 hours you may shower with the patch on. Take brief showers with your back facing the shower head.  Do not remove patch once it has been placed because that will interrupt data and decrease adhesive wear time. Push the button when you have any symptoms and write down what you were feeling. Once you have completed wearing your monitor, remove and place into box which has postage paid and place in your outgoing mailbox.  If for some reason you have misplaced your box then call our office and we can provide another box and/or mail it off for you.    CT coronary calcium score.   Test locations:  [place]   This is $99 out of pocket.   Coronary CalciumScan A coronary calcium scan is an imaging test used to look for deposits of calcium and other fatty materials (plaques) in the inner lining of the blood vessels of the heart (coronary arteries). These deposits of calcium and plaques can partly clog and narrow the coronary arteries without producing any symptoms or warning signs. This puts a person at risk for a heart attack. This test can detect these deposits before symptoms develop. Tell a health care provider about: Any allergies you  have. All medicines you are taking, including vitamins, herbs, eye drops, creams, and over-the-counter medicines. Any problems you or family members have had with anesthetic medicines. Any blood disorders you have. Any surgeries you have had. Any medical conditions you have. Whether you are pregnant or may be pregnant. What are the risks? Generally, this is a safe procedure. However, problems may occur, including: Harm to a pregnant woman and her unborn baby. This test involves the use of radiation. Radiation exposure can be dangerous to a pregnant woman and her unborn baby. If you are pregnant, you generally should not have this procedure done. Slight increase in the risk of cancer. This is because of the radiation involved in the test. What happens before the procedure? No preparation is needed for this procedure. What happens during the procedure? You will undress and remove any jewelry around your neck or chest. You will put on a hospital gown. Sticky electrodes will be placed on your chest. The electrodes will be connected to an electrocardiogram (ECG) machine to record a tracing of the electrical activity of your heart. A CT scanner will take pictures of your heart. During this time, you will be asked to lie still and hold your breath for 2-3 seconds while a picture of your heart is being taken. The procedure may vary among health care providers and hospitals. What happens after the procedure? You can get dressed. You can return to your normal activities. It is up to you to get the results of your test. Ask your  health care provider, or the department that is doing the test, when your results will be ready. Summary A coronary calcium scan is an imaging test used to look for deposits of calcium and other fatty materials (plaques) in the inner lining of the blood vessels of the heart (coronary arteries). Generally, this is a safe procedure. Tell your health care provider if you are  pregnant or may be pregnant. No preparation is needed for this procedure. A CT scanner will take pictures of your heart. You can return to your normal activities after the scan is done. This information is not intended to replace advice given to you by your health care provider. Make sure you discuss any questions you have with your health care provider. Document Released: 09/26/2007 Document Revised: 02/17/2016 Document Reviewed: 02/17/2016 Elsevier Interactive Patient Education  2017 [place]: At [facility], you and your health needs are our priority.  As part of our continuing mission to provide you with exceptional heart care, we have created designated Provider Care Teams.  These Care Teams include your primary Cardiologist (physician) and Advanced Practice Providers (APPs -  Physician Assistants and Nurse Practitioners) who all work together to provide you with the care you need, when you need it.  We recommend signing up for the patient portal called "MyChart".  Sign up information is provided on this After Visit Summary.  MyChart is used to connect with patients for Virtual Visits (Telemedicine).  Patients are able to view lab/test results, encounter notes, upcoming appointments, etc.  Non-urgent messages can be sent to your provider as well.   To learn more about what you can do with MyChart, go to [email or web address].    Your next appointment:   6 week(s)  The format for your next appointment:   In Person  Provider:   Buford Dresser, MD            [facility],acting as a scribe for Buford Dresser, MD.,have documented all relevant documentation on the behalf of Buford Dresser, MD,as directed by  Buford Dresser, MD while in the presence of Buford Dresser, MD.  I, Buford Dresser, MD, have reviewed all documentation for this visit. The documentation on 12/18/21 for the exam, diagnosis, procedures,  and orders are all accurate and complete.   Signed, Buford Dresser, MD PhD 12/18/2021 1:20 PM    [facility]

## 2021-12-18 NOTE — Progress Notes (Unsigned)
Enrolled for [employer] to mail a ZIO XT long term holter monitor to the patients address on file.  

## 2021-12-19 LAB — LIPOPROTEIN A (LPA): Lipoprotein (a): <8.4 nmol/L (ref <75.0)

## 2021-12-19 LAB — LIPID PANEL
Chol/HDL Ratio: 3.2 ratio (ref 0.0–4.4)
Cholesterol, Total: 165 mg/dL (ref 100–199)
HDL: 51 mg/dL (ref >39)
LDL Chol Calc (NIH): 85 mg/dL (ref 0–99)
Triglycerides: 168 mg/dL — ABNORMAL HIGH (ref 0–149)
VLDL Cholesterol Cal: 29 mg/dL (ref 5–40)

## 2022-01-02 DIAGNOSIS — R002 Palpitations: Secondary | ICD-10-CM

## 2022-01-02 DIAGNOSIS — I493 Ventricular premature depolarization: Secondary | ICD-10-CM | POA: Diagnosis not present

## 2022-01-20 ENCOUNTER — Inpatient Hospital Stay (HOSPITAL_BASED_OUTPATIENT_CLINIC_OR_DEPARTMENT_OTHER): Admission: RE | Admit: 2022-01-20 | Payer: Medicare HMO | Source: Ambulatory Visit

## 2022-01-22 ENCOUNTER — Encounter (HOSPITAL_BASED_OUTPATIENT_CLINIC_OR_DEPARTMENT_OTHER): Payer: Self-pay

## 2022-01-23 ENCOUNTER — Other Ambulatory Visit: Payer: Self-pay

## 2022-01-23 MED ORDER — TAMOXIFEN CITRATE 20 MG PO TABS: 20.0000 mg | ORAL_TABLET | Freq: Every day | ORAL | 0 refills | Status: DC

## 2022-01-29 ENCOUNTER — Ambulatory Visit (HOSPITAL_BASED_OUTPATIENT_CLINIC_OR_DEPARTMENT_OTHER)
Admission: RE | Admit: 2022-01-29 | Discharge: 2022-01-29 | Disposition: A | Discharge status: Home / Self Care | Payer: Medicare HMO | Source: Ambulatory Visit | Attending: Cardiology | Admitting: Cardiology

## 2022-01-29 DIAGNOSIS — Z8249 Family history of ischemic heart disease and other diseases of the circulatory system: Secondary | ICD-10-CM | POA: Insufficient documentation

## 2022-02-02 ENCOUNTER — Ambulatory Visit (HOSPITAL_BASED_OUTPATIENT_CLINIC_OR_DEPARTMENT_OTHER): Payer: Medicare HMO | Admitting: Cardiology

## 2022-02-02 ENCOUNTER — Encounter (HOSPITAL_BASED_OUTPATIENT_CLINIC_OR_DEPARTMENT_OTHER): Payer: Self-pay | Admitting: Cardiology

## 2022-02-02 VITALS — BP 122/82 | HR 64 | Ht 62.0 in | Wt 158.0 lb

## 2022-02-02 DIAGNOSIS — Z7189 Other specified counseling: Secondary | ICD-10-CM

## 2022-02-02 DIAGNOSIS — Z712 Person consulting for explanation of examination or test findings: Secondary | ICD-10-CM

## 2022-02-02 DIAGNOSIS — Z8249 Family history of ischemic heart disease and other diseases of the circulatory system: Secondary | ICD-10-CM

## 2022-02-02 DIAGNOSIS — R002 Palpitations: Secondary | ICD-10-CM | POA: Diagnosis not present

## 2022-02-02 DIAGNOSIS — R0789 Other chest pain: Secondary | ICD-10-CM

## 2022-02-02 NOTE — Progress Notes (Signed)
Cardiology Office Note:    Date:  02/02/2022   ID:  Lauren Holt, DOB July 14, 1940, MRN [id number]  PCP:  Alroy Dust, Carlean Jews.Marlou Sa, MD  Cardiologist:  Buford Dresser, MD  Referring MD: Aurea Graff.Marlou Sa, MD   CC: Follow-up for palpitations/atypical chest pain  History of Present Illness:    Lauren Holt is a 81 y.o. female with a hx of anxiety and right breast cancer, who is seen for follow-up. She was initially seen 12/18/2021 as a new consult at the request of Alroy Dust, L.Marlou Sa, MD for the evaluation and management of PVC's.  Cardiovascular risk factors: Prior clinical ASCVD: none Comorbid conditions: Told she had high cholesterol years ago, but improved with weight loss. Metabolic syndrome/Obesity: Highest adult weight 180 lbs, currently she is 156 lbs. Recently she went to a weight loss specialist who recommended taking 75 mg diethylpropion, but she was taking 1/2 tablet (37.5 mg). However, she has not taken this in the past few weeks. Since April she has lost 21 lbs. Tobacco use history: Smoked for a year when she was 68. Otherwise no smoking. Family history: Her younger sister had a heart attack a few months ago. Her brother died of a heart attack at 58 yo. Her mother denied heart issues but was on prn nitroglycerin. She believes her father had heart issues as well. Her son had a heart attack in his 72's, has type 1 diabetes. Prior cardiac testing and/or incidental findings on other testing (ie coronary calcium): Exercise level: Lately she has been swimming at least 3 times a week. They have a pool at their retirement community. She also uses weights while in the water. She denies any anginal symptoms. Current diet: Previously she had irregular heartbeats, and was told to cut down on coffee. She is now drinking 1 cup of coffee a day. She might drink 3-4 glasses of alcohol with social occasions. Otherwise up to 2 glasses a week.  Today, she reports feeling better since her last  visit. We reviewed the results of her calcium score and Zio monitor at length.  Occasionally she is still aware of central chest tightness associated with stress when she is watching the news on TV.  Every once in a while she may have sharp chest pains, but nowhere near as frequent as previously.  She has successfully lost 22 lbs.  She denies any shortness of breath, or peripheral edema. No lightheadedness, headaches, syncope, orthopnea, or PND.   Past Medical History:  Diagnosis Date   Anxiety    Breast cancer, right breast ([place])    Dermatitis    Palpitations    when she lays down, has not seen anyone for this, feels like a flutter    Past Surgical History:  Procedure Laterality Date   AMPUTATION Left 08/02/2021   Procedure: AMPUTATION DIGIT;  Surgeon: Leanora Cover, MD;  Location: [place];  Service: Orthopedics;  Laterality: Left;   BREAST BIOPSY Right 06/2017   BREAST RECONSTRUCTION WITH PLACEMENT OF TISSUE EXPANDER AND ALLODERM Right 09/24/2017   BREAST RECONSTRUCTION WITH PLACEMENT OF TISSUE EXPANDER AND ALLODERM Right 09/24/2017   Procedure: BREAST RECONSTRUCTION WITH PLACEMENT OF TISSUE EXPANDER AND ALLODERM;  Surgeon: Irene Limbo, MD;  Location: [place];  Service: Plastics;  Laterality: Right;   BREAST REDUCTION SURGERY Left 01/06/2019   Procedure: LEFT REDUCTION  (BREAST);  Surgeon: Irene Limbo, MD;  Location: [place];  Service: Plastics;  Laterality: Left;   CARPAL TUNNEL RELEASE Bilateral    COLONOSCOPY     MASTECTOMY  COMPLETE / SIMPLE W/ SENTINEL NODE BIOPSY Right 09/24/2017   MASTECTOMY W/ SENTINEL NODE BIOPSY Right 09/24/2017   Procedure: MASTECTOMY WITH SENTINEL LYMPH NODE BIOPSY;  Surgeon: Jovita Kussmaul, MD;  Location: [place];  Service: General;  Laterality: Right;   PORT-A-CATH REMOVAL Left 01/06/2019   Procedure: REMOVAL LEFT CHEST PORT;  Surgeon: Irene Limbo, MD;  Location: [place];  Service: Plastics;  Laterality:  Left;   PORTACATH PLACEMENT Left 11/08/2017   Procedure: INSERTION PORT-A-CATH;  Surgeon: Jovita Kussmaul, MD;  Location: [place];  Service: General;  Laterality: Left;   REDUCTION MAMMAPLASTY Left    REMOVAL OF TISSUE EXPANDER AND PLACEMENT OF IMPLANT Right 01/06/2019   Procedure: REMOVAL OF RIGHT TISSUE EXPANDER AND PLACEMENT OF IMPLANT;  Surgeon: Irene Limbo, MD;  Location: [place];  Service: Plastics;  Laterality: Right;   TUBAL LIGATION     VAGINAL HYSTERECTOMY      Current Medications: Current Outpatient Medications on File Prior to Visit  Medication Sig   ALPRAZolam (XANAX) 0.25 MG tablet Take 0.125 mg by mouth 2 (two) times daily as needed for anxiety.    BETA CAROTENE PO Take 7,500 mcg by mouth daily.   calcium carbonate (CALCIUM 600) 600 MG TABS tablet Take 600 mg by mouth daily.   Cholecalciferol (VITAMIN D-3) 5000 units TABS Take 1 tablet by mouth daily.   Lutein 20 MG CAPS Take 20 mg by mouth daily.    Multiple Vitamins-Minerals (ICAPS AREDS FORMULA PO) Take 1 capsule by mouth 3 (three) times a week.    Probiotic Product (PROBIOTIC DAILY PO) Take 1 capsule by mouth 3 (three) times a week.    Red Yeast Rice 600 MG CAPS Take 600 mg by mouth daily.    tamoxifen (NOLVADEX) 20 MG tablet Take 1 tablet (20 mg total) by mouth daily.   VITAMIN E PO Take 180 mg by mouth daily.   zinc gluconate 50 MG tablet Take 50 mg by mouth daily as needed (cold prevention).   No current facility-administered medications on file prior to visit.     Allergies:   Patient has no known allergies.   Social History   Tobacco Use   Smoking status: Former    Years: 1.00    Types: Cigarettes   Smokeless tobacco: Never   Tobacco comments:    Smoked X 1 year at age 45 (less than ppd)  Vaping Use   Vaping Use: Never used  Substance Use Topics   Alcohol use: Yes    Comment: social   Drug use: Never    Family History: family history includes Breast cancer in her paternal aunt  and another family member; Breast cancer (age of onset: 86) in her cousin; Breast cancer (age of onset: 40) in her cousin; Breast cancer (age of onset: 56) in her cousin; Breast cancer (age of onset: 34) in her cousin; Prostate cancer (age of onset: 59) in her father; Stroke in her mother.  ROS:   Please see the history of present illness. (+) Chest pain (+) Palpitations All other systems are reviewed and negative.    EKGs/Labs/Other Studies Reviewed:    The following studies were reviewed today:  Cardiac CT/Calcium Score  01/29/2022: IMPRESSION: 1. Coronary calcium score of 0. 2. Aortic atherosclerosis.  Monitor  01/2022: Patch Wear Time:  13 days and 23 hours    Patient had a min HR of 50 bpm, max HR of 127 bpm, and avg HR of 73 bpm.  Predominant underlying rhythm was Sinus Rhythm. 2 Supraventricular Tachycardia runs occurred, the run with the fastest interval lasting 4 beats with a max rate of 118 bpm, the longest lasting 4 beats with an avg rate of 98 bpm. No VT, atrial fibrillation, high degree block, or pauses noted. Isolated atrial and ventricular ectopy was rare (<1%). There were 29 triggered events, predominantly sinus or sinus with ectopic beat.  Chest X-ray  11/27/2021: FINDINGS: Cardiac shadow is within normal limits. The lungs are clear. Right-sided breast implant is noted. No acute bony abnormality is noted.   IMPRESSION: No active cardiopulmonary disease.  EKG:  EKG is personally reviewed.   02/02/2022:  not ordered today 12/18/2021:  NSR at 71 bpm  Recent Labs: 11/27/2021: BUN 17; Creatinine, Ser 0.74; Hemoglobin 13.1; Platelets 234; Potassium 4.0; Sodium 135   Recent Lipid Panel    Component Value Date/Time   CHOL 165 12/18/2021 1059   TRIG 168 (H) 12/18/2021 1059   HDL 51 12/18/2021 1059   CHOLHDL 3.2 12/18/2021 1059   LDLCALC 85 12/18/2021 1059    Physical Exam:    VS:  BP 122/82   Pulse 64   Ht '5\' 2"'$  (1.575 m)   Wt 158 lb (71.7 kg)   BMI 28.90  kg/m     Wt Readings from Last 3 Encounters:  02/02/22 158 lb (71.7 kg)  12/18/21 156 lb 1.6 oz (70.8 kg)  08/02/21 167 lb (75.8 kg)    GEN: Well nourished, well developed in no acute distress HEENT: Normal, moist mucous membranes NECK: No JVD CARDIAC: regular rhythm, normal S1 and S2, no rubs or gallops. No murmur. VASCULAR: Radial and DP pulses 2+ bilaterally. No carotid bruits RESPIRATORY:  Clear to auscultation without rales, wheezing or rhonchi  ABDOMEN: Soft, non-tender, non-distended MUSCULOSKELETAL:  Ambulates independently SKIN: Warm and dry, no edema NEUROLOGIC:  Alert and oriented x 3. No focal neuro deficits noted. PSYCHIATRIC:  Normal affect    ASSESSMENT:    1. Heart palpitations   2. Family history of heart disease   3. Encounter to discuss test results   4. Cardiac risk counseling   5. Atypical chest pain     PLAN:    Palpitations, PVCs noted on telemetry in ER -recent ER workup reassuring -reviewed results of her Zio monitor. Symptomatic events were sinus or sinus with ectopy. No high risk arrhythmias  Atypical chest pain Family history of heart disease -reviewed ER workup. Reassuring that hsTn unremarkable -she does have a strong family history in both her siblings and her son (son has type I diabetes but had cardiac arrest/MI in his 34s). -calcium score 0, reassuring -lp(a)<8.4 -reviewed red flag warning signs that need immediate medical attention  Cardiac risk counseling and prevention recommendations: -recommend heart healthy/Mediterranean diet, with whole grains, fruits, vegetable, fish, lean meats, nuts, and olive oil. Limit salt. -recommend moderate walking, 3-5 times/week for 30-50 minutes each session. Aim for at least 150 minutes.week. Goal should be pace of 3 miles/hours, or walking 1.5 miles in 30 minutes -recommend avoidance of tobacco products. Avoid excess alcohol. -ASCVD risk score: The 10-year ASCVD risk score (Arnett DK, et al., 2019)  is: 22.2%   Values used to calculate the score:     Age: 53 years     Sex: Female     Is Non-Hispanic African American: No     Diabetic: No     Tobacco smoker: No     Systolic Blood Pressure: 161 mmHg     Is  BP treated: No     HDL Cholesterol: 51 mg/dL     Total Cholesterol: 165 mg/dL    Plan for follow up: PRN.   Buford Dresser, MD, PhD, [facility]    Medication Adjustments/Labs and Tests Ordered: Current medicines are reviewed at length with the patient today.  Concerns regarding medicines are outlined above.   No orders of the defined types were placed in this encounter.  No orders of the defined types were placed in this encounter.  Patient Instructions  Medication Instructions:  Your Physician recommend you continue on your current medication as directed.    *If you need a refill on your cardiac medications before your next appointment, please call your pharmacy*  Follow-Up: At [facility], you and your health needs are our priority.  As part of our continuing mission to provide you with exceptional heart care, we have created designated Provider Care Teams.  These Care Teams include your primary Cardiologist (physician) and Advanced Practice Providers (APPs -  Physician Assistants and Nurse Practitioners) who all work together to provide you with the care you need, when you need it.  We recommend signing up for the patient portal called "MyChart".  Sign up information is provided on this After Visit Summary.  MyChart is used to connect with patients for Virtual Visits (Telemedicine).  Patients are able to view lab/test results, encounter notes, upcoming appointments, etc.  Non-urgent messages can be sent to your provider as well.   To learn more about what you can do with MyChart, go to [email or web address].    Your next appointment:   Call us if you need [place]!       I,Mathew Stumpf,acting as a Education administrator for [employer],  MD.,have documented all relevant documentation on the behalf of Buford Dresser, MD,as directed by  Buford Dresser, MD while in the presence of Buford Dresser, MD.  I, Buford Dresser, MD, have reviewed all documentation for this visit. The documentation on 02/02/22 for the exam, diagnosis, procedures, and orders are all accurate and complete.   Signed, Buford Dresser, MD PhD 02/02/2022     [place]

## 2022-02-02 NOTE — Patient Instructions (Signed)
Medication Instructions:  Your Physician recommend you continue on your current medication as directed.    *If you need a refill on your cardiac medications before your next appointment, please call your pharmacy*  Follow-Up: At [facility], you and your health needs are our priority.  As part of our continuing mission to provide you with exceptional heart care, we have created designated Provider Care Teams.  These Care Teams include your primary Cardiologist (physician) and Advanced Practice Providers (APPs -  Physician Assistants and Nurse Practitioners) who all work together to provide you with the care you need, when you need it.  We recommend signing up for the patient portal called "MyChart".  Sign up information is provided on this After Visit Summary.  MyChart is used to connect with patients for Virtual Visits (Telemedicine).  Patients are able to view lab/test results, encounter notes, upcoming appointments, etc.  Non-urgent messages can be sent to your provider as well.   To learn more about what you can do with MyChart, go to [email or web address].    Your next appointment:   Call us if you need us 

## 2022-03-17 ENCOUNTER — Inpatient Hospital Stay: Payer: Medicare HMO | Attending: Hematology and Oncology | Admitting: Hematology and Oncology

## 2022-03-17 ENCOUNTER — Encounter: Payer: Self-pay | Admitting: Hematology and Oncology

## 2022-03-17 ENCOUNTER — Other Ambulatory Visit: Payer: Self-pay

## 2022-03-17 VITALS — BP 149/74 | HR 71 | Temp 97.9°F | Resp 16 | Ht 62.0 in | Wt 157.8 lb

## 2022-03-17 DIAGNOSIS — E785 Hyperlipidemia, unspecified: Secondary | ICD-10-CM | POA: Insufficient documentation

## 2022-03-17 DIAGNOSIS — Z170 Estrogen receptor positive status [ER+]: Secondary | ICD-10-CM | POA: Diagnosis present

## 2022-03-17 DIAGNOSIS — Z79810 Long term (current) use of selective estrogen receptor modulators (SERMs): Secondary | ICD-10-CM | POA: Insufficient documentation

## 2022-03-17 DIAGNOSIS — Z8614 Personal history of Methicillin resistant Staphylococcus aureus infection: Secondary | ICD-10-CM | POA: Insufficient documentation

## 2022-03-17 DIAGNOSIS — C50911 Malignant neoplasm of unspecified site of right female breast: Secondary | ICD-10-CM | POA: Diagnosis present

## 2022-03-17 DIAGNOSIS — Z8042 Family history of malignant neoplasm of prostate: Secondary | ICD-10-CM | POA: Diagnosis not present

## 2022-03-17 DIAGNOSIS — Z803 Family history of malignant neoplasm of breast: Secondary | ICD-10-CM | POA: Insufficient documentation

## 2022-03-17 DIAGNOSIS — C50311 Malignant neoplasm of lower-inner quadrant of right female breast: Secondary | ICD-10-CM

## 2022-03-17 DIAGNOSIS — Z79899 Other long term (current) drug therapy: Secondary | ICD-10-CM | POA: Diagnosis not present

## 2022-03-17 DIAGNOSIS — Z923 Personal history of irradiation: Secondary | ICD-10-CM | POA: Diagnosis not present

## 2022-03-17 DIAGNOSIS — Z87891 Personal history of nicotine dependence: Secondary | ICD-10-CM | POA: Insufficient documentation

## 2022-03-17 NOTE — Progress Notes (Signed)
Lauren Holt  [phone] [phone]     ID: Lauren Holt DOB: [birth year]  MR#: [id number]  JYN#:[id number]  Patient Care Team: Alroy Dust, Carlean Jews.Marlou Sa, MD as PCP - General (Family Medicine) Buford Dresser, MD as PCP - Cardiology (Cardiology) Jovita Kussmaul, MD as Consulting Physician (General Surgery) Magrinat, Virgie Dad, MD (Inactive) as Consulting Physician (Oncology) Kyung Rudd, MD as Consulting Physician (Radiation Oncology) OTHER MD:   CHIEF COMPLAINT: Estrogen receptor positive breast cancer (s/p right mastectomy)  CURRENT TREATMENT: tamoxifen   INTERVAL HISTORY: Lauren Holt returns today for follow-up of her estrogen receptor positive breast cancer.  She was started on anastrozole back in 2019 but she apparently did not take it as instructed and has started taking the tamoxifen more diligently since about 2020.  At this time since she had several interruptions and antiestrogen therapy, we have discussed about at least continuing it till May 2025 and reassess.  Since last visit, she had some chest pain and went to the ER and was not found to have a heart attack.  She otherwise reports some right upper extremity weakness and some hip pain.  She is to start back on physical therapy. Her most recent mammogram on May 20, 2021 of the left breast with no evidence of malignancy, breast density category B  REVIEW OF SYSTEMS: Lauren Holt does have some urinary issues, and tells me she has a rectal prolapse.  Recall she is status post hysterectomy and bladder tack up.  She is being referred by her gynecologist for physical therapy regarding this.  She uses Neosporin to moist 10 the labia and has developed MRSA infections perhaps as a result of that.  For exercise she walks with a friend a couple of times a week perhaps 2 miles at a time.  Aside from these issues a detailed review of systems today was stable   COVID 19 VACCINATION STATUS: Pfizer x3, also  had COVID January 2022   HISTORY OF CURRENT ILLNESS: On the original intake note:  Lauren Holt had routine screening mammography on 06/14/2017 showing a possible area of calcifications in the right breast. She underwent unilateral right diagnostic mammography with tomography at [facility] on 06/23/2017 showing: Suspicious calcifications within the inferior right breast, spanning nearly 10 cm, more superior component spanning 4 cm extent. The more superior component may have a small associated mass. Ultrasonography of the right axilla on 07/01/2017 showed normal right axillary lymph nodes.   Accordingly on 06/29/2017 she proceeded to biopsy of the right breast area in question. The pathology from this procedure showed ([id number]): In the right breast lower central, more lateral superior: invasive ductal carcinoma grade I. In the lower central, more medial inferior: Ductal carcinoma in situ, high grade, with calcifications. The  invasive tumor was significant for estrogen receptor, 100% positive and progesterone receptor, 10% positive, both with strong staining intensity. Proliferation marker Ki67 at 5%. HER2  not amplified. The ductal carcinoma in situ was significant for estrogen receptor, 100% positive and progesterone receptor, 80% positive, both with strong staining intensity.   The patient's subsequent history is as detailed below.   PAST MEDICAL HISTORY: Past Medical History:  Diagnosis Date   Anxiety    Breast cancer, right breast ([place])    Dermatitis    Palpitations    when she lays down, has not seen anyone for this, feels like a flutter   HLD ( but not too high)   PAST SURGICAL HISTORY: Past Surgical History:  Procedure Laterality Date  AMPUTATION Left 08/02/2021   Procedure: AMPUTATION DIGIT;  Surgeon: Leanora Cover, MD;  Location: [facility];  Service: Orthopedics;  Laterality: Left;   BREAST BIOPSY Right 06/2017   BREAST RECONSTRUCTION WITH PLACEMENT OF TISSUE  EXPANDER AND ALLODERM Right 09/24/2017   BREAST RECONSTRUCTION WITH PLACEMENT OF TISSUE EXPANDER AND ALLODERM Right 09/24/2017   Procedure: BREAST RECONSTRUCTION WITH PLACEMENT OF TISSUE EXPANDER AND ALLODERM;  Surgeon: Irene Limbo, MD;  Location: [facility];  Service: Plastics;  Laterality: Right;   BREAST REDUCTION SURGERY Left 01/06/2019   Procedure: LEFT REDUCTION  (BREAST);  Surgeon: Irene Limbo, MD;  Location: [facility];  Service: Plastics;  Laterality: Left;   CARPAL TUNNEL RELEASE Bilateral    COLONOSCOPY     MASTECTOMY COMPLETE / SIMPLE W/ SENTINEL NODE BIOPSY Right 09/24/2017   MASTECTOMY W/ SENTINEL NODE BIOPSY Right 09/24/2017   Procedure: MASTECTOMY WITH SENTINEL LYMPH NODE BIOPSY;  Surgeon: Jovita Kussmaul, MD;  Location: [facility];  Service: General;  Laterality: Right;   PORT-A-CATH REMOVAL Left 01/06/2019   Procedure: REMOVAL LEFT CHEST PORT;  Surgeon: Irene Limbo, MD;  Location: [facility];  Service: Plastics;  Laterality: Left;   PORTACATH PLACEMENT Left 11/08/2017   Procedure: INSERTION PORT-A-CATH;  Surgeon: Jovita Kussmaul, MD;  Location: [facility];  Service: General;  Laterality: Left;   REDUCTION MAMMAPLASTY Left    REMOVAL OF TISSUE EXPANDER AND PLACEMENT OF IMPLANT Right 01/06/2019   Procedure: REMOVAL OF RIGHT TISSUE EXPANDER AND PLACEMENT OF IMPLANT;  Surgeon: Irene Limbo, MD;  Location: [facility];  Service: Plastics;  Laterality: Right;   TUBAL LIGATION     VAGINAL HYSTERECTOMY     Partial Hysterectomy without BSO. Because they were too close to the bladder.    FAMILY HISTORY Family History  Problem Relation Age of Onset   Breast cancer Paternal Aunt    Breast cancer Cousin 63   Stroke Mother        caused blindness   Prostate cancer Father 22   Breast cancer Cousin 25   Breast cancer Cousin 25   Breast cancer Cousin 35   Breast cancer Other    The patient's father died at age 21 due to emphysema. The  patient's mother is alive at age 104, turning 96 March 2020. The patient has 5 brothers and 3 sisters. There was a paternal great aunt with breast cancer. There was also a paternal cousin with breast cancer.    GYNECOLOGIC HISTORY:  No LMP recorded. Patient has had a hysterectomy. Menarche: 81 years old Age at first live birth: 81 years old The patient is GXP3. She is s/p partial hysterectomy. Both of her ovaries remain. She used oral contraceptives with no complications. She also used HRT for 2 years without complications.     SOCIAL HISTORY:  Lauren Holt is now retired. 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 She is divorced.  2 days a week as she works with a "older lady" for about 4 hours mostly helping with some home issues and driving her around.  Her oldest daughter, Lauren Holt", is a [employer] Land in [place], [place]. The patient's son, Lauren Holt, works in a Proofreader in [place]. The patient's son, Lauren Holt, works in Quarry manager in [place].  The patient has 4 grandchildren. She currently does not belong to a church.   ADVANCED DIRECTIVES: Not in place.  At the 07/07/2017 visit the patient was given the  appropriate documents to complete and notarized at her discretion.   HEALTH MAINTENANCE: Social History   Tobacco Use   Smoking status: Former    Years: 1.00    Types: Cigarettes   Smokeless tobacco: Never   Tobacco comments:    Smoked X 1 year at age 55 (less than ppd)  Vaping Use   Vaping Use: Never used  Substance Use Topics   Alcohol use: Yes    Comment: social   Drug use: Never     Colonoscopy: 02/2017 at [facility]  PAP:   Bone density: 2018/ osteopenia   No Known Allergies  Current Outpatient Medications  Medication Sig Dispense Refill   ALPRAZolam (XANAX) 0.25 MG tablet Take 0.125 mg by mouth 2 (two) times daily as needed for anxiety.      BETA CAROTENE PO Take 7,500 mcg by mouth daily.     calcium  carbonate (CALCIUM 600) 600 MG TABS tablet Take 600 mg by mouth daily.     Cholecalciferol (VITAMIN D-3) 5000 units TABS Take 1 tablet by mouth daily.     Lutein 20 MG CAPS Take 20 mg by mouth daily.      Multiple Vitamins-Minerals (ICAPS AREDS FORMULA PO) Take 1 capsule by mouth 3 (three) times a week.      Probiotic Product (PROBIOTIC DAILY PO) Take 1 capsule by mouth 3 (three) times a week.      Red Yeast Rice 600 MG CAPS Take 600 mg by mouth daily.      tamoxifen (NOLVADEX) 20 MG tablet Take 1 tablet (20 mg total) by mouth daily. 90 tablet 0   VITAMIN E PO Take 180 mg by mouth daily.     zinc gluconate 50 MG tablet Take 50 mg by mouth daily as needed (cold prevention).     No current facility-administered medications for this visit.    OBJECTIVE: White woman in no acute distress  Vitals:   03/17/22 1133  BP: (!) 149/74  Pulse: 71  Resp: 16  Temp: 97.9 F (36.6 C)  SpO2: 99%     Body mass index is 28.86 kg/m.   Wt Readings from Last 3 Encounters:  03/17/22 157 lb 12.8 oz (71.6 kg)  02/02/22 158 lb (71.7 kg)  12/18/21 156 lb 1.6 oz (70.8 kg)  ECOG FS:1 - Symptomatic but completely ambulatory  Sclerae unicteric, EOMs intact She is status post right mastectomy and reconstruction.  No palpable nodules.  Implant appears smooth and intact.  Left breast with no palpable masses or regional adenopathy.  No cervical adenopathy.  LAB RESULTS:  CMP     Component Value Date/Time   NA 135 11/27/2021 0220   K 4.0 11/27/2021 0220   CL 99 11/27/2021 0220   CO2 29 11/27/2021 0220   GLUCOSE 135 (H) 11/27/2021 0220   BUN 17 11/27/2021 0220   CREATININE 0.74 11/27/2021 0220   CREATININE 0.71 12/15/2017 1049   CALCIUM 9.0 11/27/2021 0220   PROT 7.1 05/30/2020 1235   ALBUMIN 4.0 05/30/2020 1235   AST 20 05/30/2020 1235   AST 15 12/15/2017 1049   ALT 18 05/30/2020 1235   ALT 13 12/15/2017 1049   ALKPHOS 75 05/30/2020 1235   BILITOT 0.4 05/30/2020 1235   BILITOT 0.4 12/15/2017 1049    GFRNONAA >60 11/27/2021 0220   GFRNONAA >60 12/15/2017 1049   GFRAA >60 05/18/2018 1443   GFRAA >60 12/15/2017 1049    No results found for: "TOTALPROTELP", "ALBUMINELP", "A1GS", "A2GS", "BETS", "BETA2SER", "GAMS", "MSPIKE", "  SPEI"  No results found for: "KPAFRELGTCHN", "LAMBDASER", "KAPLAMBRATIO"  Lab Results  Component Value Date   WBC 6.0 11/27/2021   NEUTROABS 2.8 11/27/2021   HGB 13.1 11/27/2021   HCT 39.8 11/27/2021   MCV 98.8 11/27/2021   PLT 234 11/27/2021   No results found for: "LABCA2"  No components found for: "ZOXWRU045"  No results for input(s): "INR" in the last 168 hours.  No results found for: "LABCA2"  No results found for: "WUJ811"  No results found for: "CAN125"  No results found for: "CAN153"  No results found for: "CA2729"  No components found for: "HGQUANT"  No results found for: "CEA1", "CEA" / No results found for: "CEA1", "CEA"   No results found for: "AFPTUMOR"  No results found for: "CHROMOGRNA"  No results found for: "HGBA", "HGBA2QUANT", "HGBFQUANT", "HGBSQUAN" (Hemoglobinopathy evaluation)   No results found for: "LDH"  No results found for: "IRON", "TIBC", "IRONPCTSAT" (Iron and TIBC)  No results found for: "FERRITIN"  Urinalysis    Component Value Date/Time   COLORURINE STRAW (A) 04/12/2018 1426   APPEARANCEUR CLEAR 04/12/2018 1426   LABSPEC 1.003 (L) 04/12/2018 1426   PHURINE 6.0 04/12/2018 1426   GLUCOSEU NEGATIVE 04/12/2018 1426   HGBUR NEGATIVE 04/12/2018 1426   BILIRUBINUR NEGATIVE 04/12/2018 1426   KETONESUR NEGATIVE 04/12/2018 1426   PROTEINUR NEGATIVE 04/12/2018 1426   NITRITE NEGATIVE 04/12/2018 1426   LEUKOCYTESUR NEGATIVE 04/12/2018 1426    STUDIES: No results found.    ELIGIBLE FOR AVAILABLE RESEARCH PROTOCOL: exact sciences study   ASSESSMENT: 81 y.o. Lauren Holt, Lauren Holt woman status post right breast biopsy 07/01/2017 for a clinical mT2 N0, stage IB invasive ductal carcinoma, grade 1, estrogen and  progesterone receptor positive, HER-2 nonamplified, with an MIB-105%  (1) a second area in the right breast biopsied 07/01/2017 showed ductal carcinoma in situ, grade 3, estrogen and progesterone receptor positive.  (2) status post right mastectomy and sentinel lymph node sampling 09/24/2017 for a pT1b pN0, stage IA invasive ductal carcinoma, grade 1, with negative margins  (a) a total of 5 sentinel lymph nodes were removed  (b) definitive reconstruction with smooth silicone implant placement [Natrelle Inspira Smooth Round Extra Projection 615 implant placed right chest. REF [id number] SN [id number]. Lon 01/06/2019], with benign pathology; simultaneous left breast reduction    (3) The Oncotype DX score was 28, predicting a risk of outside the breast recurrence over the next 9 years of 17% if the patient's only systemic therapy is tamoxifen for 5 years.  It also predicts a significant benefit from chemotherapy.  (4) cyclophosphamide, methotrexate and fluorouracil (CMF) chemotherapy started 11/17/2017, repeated every 21 days x 8, last treatment 04/12/2018  (a) Udenyca added with cycle 4 and subsequent cycles due to neutropenia  (5) anastrozole started 08/04/2017, stopped August 2019 (chemotherapy),  resumed FEB 2020, discontinued September 2020 --   (a) bone density 12/07/2016 at Dr Virgilio Belling showed a T score of - 1.5 to -2.0  (b) tamoxifen started March 2022  (6) genetics testing 07/22/2017 through the Common Hereditary Cancer Panel offered by [facility] found no deleterious mutations in APC, ATM, AXIN2, BARD1, BMPR1A, BRCA1, BRCA2, BRIP1, CDH1, CDKN2A (p14ARF), CDKN2A (p16INK4a), CKD4, CHEK2, CTNNA1, DICER1, EPCAM (Deletion/duplication testing only), GREM1 (promoter region deletion/duplication testing only), KIT, MEN1, MLH1, MSH2, MSH3, MSH6, MUTYH, NBN, NF1, NHTL1, PALB2, PDGFRA, PMS2, POLD1, POLE, PTEN, RAD50, RAD51C, RAD51D, SDHB, SDHC, SDHD, SMAD4, SMARCA4. STK11, TP53, TSC1, TSC2, and VHL.  The  following genes were evaluated for sequence changes only: SDHA and HOXB13  c.251G>A variant only.   (a) a variant of uncertain significance in the gene DICER1 was identified.  c.2116+6G>A (Intronic)  (7) CT A/P done on 02/11/2018 for LLQ pain, no cause for pain identified, but two indeterminate liver lesions were noted  (a) MRI liver 05/02/2019 shows no change in the lesion of concern, with a second very small lesion in the inferior tip of the right liver.  (b) repeat liver MRI 03/27/2020 stable and consistent with a benign hemangioma   PLAN:  Patient is here for follow-up.  She has been taking tamoxifen as prescribed.  She denies any concerns with tamoxifen.  Physical examination today without any concerns. She is due for her left breast mammogram in February 2024, this has been ordered. At this time recommendation was to continue tamoxifen at least till May 2025 since she had several interruptions and she did not take it for almost a year although it was prescribed.  She will return to clinic in 1 year or sooner as needed.  She is aware of adverse effects of tamoxifen including but not limited to DVT/PE, endometrial hyperplasia and endometrial cancer.  She was recommended to hold tamoxifen a week before surgery and a week after surgery if she were to undergo any surgical procedures. Self breast exam and exercise recommended.  Total time spent: 30 min *Total Encounter Time as defined by the Centers for Medicare and Medicaid Services includes, in addition to the face-to-face time of a patient visit (documented in the note above) non-face-to-face time: obtaining and reviewing outside history, ordering and reviewing medications, tests or procedures, care coordination (communications with other health care professionals or caregivers) and documentation in the medical record.

## 2022-04-24 ENCOUNTER — Ambulatory Visit (INDEPENDENT_AMBULATORY_CARE_PROVIDER_SITE_OTHER): Payer: Medicare HMO | Admitting: Obstetrics and Gynecology

## 2022-04-24 ENCOUNTER — Encounter: Payer: Self-pay | Admitting: Obstetrics and Gynecology

## 2022-04-24 VITALS — BP 132/60 | HR 72 | Ht 62.72 in | Wt 153.6 lb

## 2022-04-24 DIAGNOSIS — N3281 Overactive bladder: Secondary | ICD-10-CM | POA: Diagnosis not present

## 2022-04-24 DIAGNOSIS — N816 Rectocele: Secondary | ICD-10-CM | POA: Diagnosis not present

## 2022-04-24 DIAGNOSIS — R350 Frequency of micturition: Secondary | ICD-10-CM

## 2022-04-24 DIAGNOSIS — N993 Prolapse of vaginal vault after hysterectomy: Secondary | ICD-10-CM

## 2022-04-24 DIAGNOSIS — N393 Stress incontinence (female) (male): Secondary | ICD-10-CM | POA: Diagnosis not present

## 2022-04-24 LAB — POCT URINALYSIS DIPSTICK
Bilirubin, UA: NEGATIVE
Blood, UA: NEGATIVE
Glucose, UA: NEGATIVE
Ketones, UA: NEGATIVE
Leukocytes, UA: NEGATIVE
Nitrite, UA: NEGATIVE
Protein, UA: NEGATIVE
Spec Grav, UA: 1.01 (ref 1.010–1.025)
Urobilinogen, UA: 0.2 E.U./dL
pH, UA: 7 (ref 5.0–8.0)

## 2022-04-24 NOTE — Progress Notes (Signed)
[facility] New Patient Evaluation and Consultation  Referring Provider: Ma Hillock, * PCP: Alroy Dust, Carlean Jews.Marlou Sa, MD Date of Service: 04/24/2022  SUBJECTIVE Chief Complaint: New Patient (Initial Visit) Lauren Holt is a 82 y.o. female is here for Prolapsed rectum.)  History of Present Illness: Lauren Holt is a 82 y.o. White or Caucasian female seen in consultation at the request of FNP Lawrence Santiago for evaluation of prolapse.    Review of records significant for: Has been using a pessary for posterior vaginal wall prolapse. Has done pelvic PT with minimal improvement.   Urinary Symptoms: Leaks urine with cough/ sneeze, going from sitting to standing, with a full bladder, with movement to the bathroom, and with urgency Leaks 3-4 time(s) per day. UUI > SUI.  Pad use: 2 pads per day.   She is bothered by her UI symptoms.  Day time voids 6.  Nocturia: 2-3 times per night to void. Voiding dysfunction: she empties her bladder well.  does not use a catheter to empty bladder.  When urinating, she feels difficulty starting urine stream Drinks: 2 cups coffee in AM, 3- 32oz bottles water per day, occasional tea She is drinking one of the bottles of water through the night.   UTIs:  0  UTI's in the last year.   Denies history of blood in urine and kidney or bladder stones  Pelvic Organ Prolapse Symptoms:                  She Admits to a feeling of a bulge the vaginal area. It has been present for 7 months.  She Admits to seeing a bulge.  This bulge is bothersome. She tried a ring pessary, but it would not stay in place (did not go home with it).  Did not have a lot of improvement with PT.   She had a "bladder tuck" at the time of time of vaginal hysterectomy, but not sure if she had prolapse at that time or bladder leakage.   Bowel Symptom: Bowel movements: 1-2 time(s) per day Stool consistency: hard Straining: yes.  Splinting: yes.  Incomplete  evacuation: yes.  She Denies accidental bowel leakage / fecal incontinence Bowel regimen: fiber  Sexual Function Sexually active: no.   Pelvic Pain Denies pelvic pain    Past Medical History:  Past Medical History:  Diagnosis Date   Anxiety    Breast cancer, right breast ([place])    Dermatitis    Palpitations    when she lays down, has not seen anyone for this, feels like a flutter     Past Surgical History:   Past Surgical History:  Procedure Laterality Date   AMPUTATION Left 08/02/2021   Procedure: AMPUTATION DIGIT;  Surgeon: Leanora Cover, MD;  Location: [facility];  Service: Orthopedics;  Laterality: Left;   BREAST BIOPSY Right 06/2017   BREAST RECONSTRUCTION WITH PLACEMENT OF TISSUE EXPANDER AND ALLODERM Right 09/24/2017   BREAST RECONSTRUCTION WITH PLACEMENT OF TISSUE EXPANDER AND ALLODERM Right 09/24/2017   Procedure: BREAST RECONSTRUCTION WITH PLACEMENT OF TISSUE EXPANDER AND ALLODERM;  Surgeon: Irene Limbo, MD;  Location: [facility];  Service: Plastics;  Laterality: Right;   BREAST REDUCTION SURGERY Left 01/06/2019   Procedure: LEFT REDUCTION  (BREAST);  Surgeon: Irene Limbo, MD;  Location: [facility];  Service: Plastics;  Laterality: Left;   CARPAL TUNNEL RELEASE Bilateral    COLONOSCOPY     MASTECTOMY COMPLETE / SIMPLE W/ SENTINEL NODE BIOPSY Right 09/24/2017   MASTECTOMY W/ SENTINEL  NODE BIOPSY Right 09/24/2017   Procedure: MASTECTOMY WITH SENTINEL LYMPH NODE BIOPSY;  Surgeon: Jovita Kussmaul, MD;  Location: [facility];  Service: General;  Laterality: Right;   PORT-A-CATH REMOVAL Left 01/06/2019   Procedure: REMOVAL LEFT CHEST PORT;  Surgeon: Irene Limbo, MD;  Location: [facility];  Service: Plastics;  Laterality: Left;   PORTACATH PLACEMENT Left 11/08/2017   Procedure: INSERTION PORT-A-CATH;  Surgeon: Jovita Kussmaul, MD;  Location: [facility];  Service: General;  Laterality: Left;   REDUCTION MAMMAPLASTY Left    REMOVAL OF TISSUE  EXPANDER AND PLACEMENT OF IMPLANT Right 01/06/2019   Procedure: REMOVAL OF RIGHT TISSUE EXPANDER AND PLACEMENT OF IMPLANT;  Surgeon: Irene Limbo, MD;  Location: [facility];  Service: Plastics;  Laterality: Right;   TUBAL LIGATION     VAGINAL HYSTERECTOMY     with "bladder tuck"     Past OB/GYN History: OB History  Gravida Para Term Preterm AB Living  '3 3 3     3  '$ SAB IAB Ectopic Multiple Live Births               # Outcome Date GA Lbr Len/2nd Weight Sex Delivery Anes PTL Lv  3 Term           2 Term           1 Term             Vaginal deliveries: 3,  Forceps/ Vacuum deliveries: 0, Cesarean section: 0 S/p hysterectomy Takes tamoxifen   Medications: She has a current medication list which includes the following prescription(s): alprazolam, ascorbic acid, beta carotene, calcium carbonate, vitamin d-3, diethylpropion hcl cr, lutein, multiple vitamins-minerals, probiotic product, red yeast rice, tamoxifen, vitamin e, zinc gluconate, and zinc gluconate.   Allergies: Patient has No Known Allergies.   Social History:  Social History   Tobacco Use   Smoking status: Former    Years: 1.00    Types: Cigarettes   Smokeless tobacco: Never   Tobacco comments:    Smoked X 1 year at age 77 (less than ppd)  Vaping Use   Vaping Use: Never used  Substance Use Topics   Alcohol use: Yes    Comment: social   Drug use: Never    Relationship status: divorced She lives with son.   She is not employed. Regular exercise: Yes: walking 3 days per week History of abuse: No  Family History:   Family History  Problem Relation Age of Onset   Breast cancer Paternal Aunt    Breast cancer Cousin 82   Stroke Mother        caused blindness   Prostate cancer Father 74   Breast cancer Cousin 43   Breast cancer Cousin 25   Breast cancer Cousin 35   Breast cancer Other      Review of Systems: Review of Systems  Constitutional:  Positive for malaise/fatigue. Negative  for fever and weight loss.  Respiratory:  Negative for cough, shortness of breath and wheezing.   Cardiovascular:  Negative for chest pain, palpitations and leg swelling.  Gastrointestinal:  Negative for abdominal pain and blood in stool.  Genitourinary:  Negative for dysuria.  Musculoskeletal:  Positive for myalgias.  Skin:  Negative for rash.  Neurological:  Positive for headaches. Negative for dizziness.  Endo/Heme/Allergies:  Does not bruise/bleed easily.  Psychiatric/Behavioral:  Negative for depression. The patient is nervous/anxious.      OBJECTIVE Physical Exam: Vitals:   04/24/22 1344  04/24/22 1345  BP: (!) 160/69 132/60  Pulse: 72   Weight: 153 lb 9.6 oz (69.7 kg)   Height: 5' 2.72" (1.593 m)     Physical Exam Constitutional:      General: She is not in acute distress. Pulmonary:     Effort: Pulmonary effort is normal.  Abdominal:     General: There is no distension.     Palpations: Abdomen is soft.     Tenderness: There is no abdominal tenderness. There is no rebound.  Musculoskeletal:        General: No swelling. Normal range of motion.  Skin:    General: Skin is warm and dry.     Findings: No rash.  Neurological:     Mental Status: She is alert and oriented to person, place, and time.  Psychiatric:        Mood and Affect: Mood normal.        Behavior: Behavior normal.      GU / Detailed Urogynecologic Evaluation:  Pelvic Exam: Normal external female genitalia; Bartholin's and Skene's glands normal in appearance; urethral meatus normal in appearance, no urethral masses or discharge.   CST: negative  s/p hysterectomy: Speculum exam reveals normal vaginal mucosa with  atrophy and normal vaginal cuff.  Adnexa no mass, fullness, tenderness.    Pelvic floor strength III/V, puborectalis IV/V external anal sphincter IV/V  Pelvic floor musculature: Right levator non-tender, Right obturator non-tender, Left levator non-tender, Left obturator  non-tender  POP-Q:   POP-Q  -2                                            Aa   -2                                           Ba  -4.5                                              C   4                                            Gh  3.5                                            Pb  6.5                                            tvl   1                                            Ap  1  Bp                                                 D      Rectal Exam:  Normal sphincter tone, moderate distal rectocele, enterocoele present, no rectal masses, no sign of dyssynergia when asking the patient to bear down.  Post-Void Residual (PVR) by Bladder Scan: In order to evaluate bladder emptying, we discussed obtaining a postvoid residual and she agreed to this procedure.  Procedure: The ultrasound unit was placed on the patient's abdomen in the suprapubic region after the patient had voided. A PVR of 30 ml was obtained by bladder scan.  Laboratory Results: POC urine: negative   ASSESSMENT AND PLAN Lauren Holt is a 82 y.o. with:  1. Prolapse of posterior vaginal wall   2. Vaginal vault prolapse after hysterectomy   3. Overactive bladder   4. Urinary frequency   5. SUI (stress urinary incontinence, female)    Stage I anterior, Stage II posterior, Stage I apical prolapse - For treatment of pelvic organ prolapse, we discussed options for management including expectant management, conservative management, and surgical management, such as Kegels, a pessary, pelvic floor physical therapy, and specific surgical procedures. - She is interested in proceeding with surgery. Will plan for:  Posterior repair, perineorrhaphy, sacrospinous ligament fixation, possible sling, cystoscopy   2. OAB - We discussed the symptoms of overactive bladder (OAB), which include urinary urgency, urinary frequency, nocturia, with or without urge incontinence.   While we do not know the exact etiology of OAB, several treatment options exist. We discussed management including behavioral therapy (decreasing bladder irritants, urge suppression strategies, timed voids, bladder retraining), physical therapy, medication.  - Recommended decreasing water intake, especially at night, and bladder irritants. If no improvement, then will plan for medication.   3. SUI - For treatment of stress urinary incontinence,  non-surgical options include expectant management, weight loss, physical therapy, as well as a pessary.  Surgical options include a midurethral sling, Burch urethropexy, and transurethral injection of a bulking agent. - She is interested in a sling. Will have her undergo urodynamic testing due to history of prior prolapse repair and mixed incontinence.   Return for urodynamics.    Jaquita Folds, MD

## 2022-04-24 NOTE — Patient Instructions (Addendum)
Today we talked about ways to manage bladder urgency such as altering your diet to avoid irritative beverages and foods (bladder diet) as well as attempting to decrease stress and other exacerbating factors.   Stop drinking 2-3 hours prior to bedtime.  The Most Bothersome Foods* The Least Bothersome Foods*  Coffee - Regular & Decaf Tea - caffeinated Carbonated beverages - cola, non-colas, diet & caffeine-free Alcohols - Beer, Red Wine, White Wine, Champagne Fruits - Grapefruit, Hypoluxo, Orange, [employer] - Cranberry, Grapefruit, Orange, Pineapple Vegetables - Tomato & Tomato Products Flavor Enhancers - Hot peppers, Spicy foods, Chili, Horseradish, Vinegar, Monosodium glutamate (MSG) Artificial Sweeteners - NutraSweet, Sweet 'N Low, Equal (sweetener), Saccharin Ethnic foods - [place], [place], [place] food [employer]s - low-fat & whole Fruits - Bananas, Blueberries, Honeydew melon, Pears, Raisins, Watermelon Vegetables - Broccoli, Brussels Sprouts, [place], Carrots, Cauliflower, [place], Cucumber, Mushrooms, Peas, Radishes, Squash, Zucchini, White potatoes, Sweet potatoes & yams Poultry - Chicken, Eggs, [place], [employer] - Beef, Programmer, multimedia, Lamb Seafood - Shrimp, [place] fish, Salmon Grains - Oat, Rice Snacks - Pretzels, Popcorn  *Lissa Morales et al. Diet and its role in interstitial cystitis/bladder pain syndrome (IC/BPS) and comorbid conditions. [place] 2012 Jan 11.   URODYNAMICS (UDS) TEST INFORMATION  IMPORTANT: Please try to arrive with a comfortably full bladder!    What is UDS? Urodynamics is a bladder test used to evaluate how your bladder and urethra (tube you urinate out of) work to help find out the cause of your bladder symptoms and evaluate your bladder function in order to make the best treatment plan for you.   What to expect? A nurse will perform the test and will be with you during the entire exam. First we will have to empty your bladder on a special  toilet.  After you have emptied your bladder, very small catheters (plastic tubing) will be placed into your bladder and into your vagina (or rectum). These special small catheters measure pressure to help measure your bladder function.  Your bladder will be gently filled with water and you will be asked to cough and strain at several different points during the test.   You will then be asked to empty your bladder in the special toilet with the catheters in place. Most patients can urinate (pee) easily with the catheters in place since the catheters are so small. In total this procedure lasts about 45 minutes to 1 hour.  After your test is completed, you will return (or possibly be seen the same day) to review the results, talk about treatment options and make a plan moving forward.

## 2022-04-28 ENCOUNTER — Ambulatory Visit (INDEPENDENT_AMBULATORY_CARE_PROVIDER_SITE_OTHER): Payer: Medicare HMO | Admitting: Obstetrics and Gynecology

## 2022-04-28 ENCOUNTER — Encounter: Payer: Self-pay | Admitting: Obstetrics and Gynecology

## 2022-04-28 VITALS — BP 155/84 | HR 67

## 2022-04-28 DIAGNOSIS — R350 Frequency of micturition: Secondary | ICD-10-CM | POA: Diagnosis not present

## 2022-04-28 DIAGNOSIS — N3281 Overactive bladder: Secondary | ICD-10-CM | POA: Diagnosis not present

## 2022-04-28 DIAGNOSIS — N393 Stress incontinence (female) (male): Secondary | ICD-10-CM | POA: Diagnosis not present

## 2022-04-28 NOTE — Progress Notes (Addendum)
[facility] Urodynamics Procedure  Referring Physician: Alroy Dust, L.Marlou Sa, MD Date of Procedure: 04/28/2022  Lauren Holt is a 82 y.o. female who presents for urodynamic evaluation. Indication(s) for study: SUI and OAB  Vital Signs: BP (!) 155/84   Pulse 67   Laboratory Results: A catheterized urine specimen revealed:  POC urine: Negative for all components   Voiding Diary: Not done  Procedure Timeout:  The correct patient was verified and the correct procedure was verified. The patient was in the correct position and safety precautions were reviewed based on at the patient's history.  Urodynamic Procedure A 64F dual lumen urodynamics catheter was placed under sterile conditions into the patient's bladder. A 64F catheter was placed into the rectum in order to measure abdominal pressure. EMG patches were placed in the appropriate position.  All connections were confirmed and calibrations/adjusted made. Saline was instilled into the bladder through the dual lumen catheters.  Cough/valsalva pressures were measured periodically during filling.  Patient was allowed to void.  The bladder was then emptied of its residual.  UROFLOW: Revealed a Qmax of 22.8 mL/sec.  She voided 361.5 mL and had a residual of 170 mL.  It was a intermittent pattern and represented normal habits.  CMG: This was performed with sterile water in the sitting position at a fill rate of 20-30 mL/min.    First sensation of fullness was 108 mLs,  First urge was 142 mLs,  Strong urge was 188 mLs and  Capacity was 728 mLs  Stress incontinence was demonstrated  Lowest positive Barrier CLPP was 60 cmH20 at 166 ml. Lowest positive Barrier VLPP was 68 cmH20 at 166 ml.  Detrusor function was minimally overactive, with two small phasic contractions seen.  The first occurred at 108 mL to 18.9 cm of water and was associated with urge.  Compliance:  Normal. End fill detrusor pressure was 3.2cmH20.   Calculated compliance was 227.5 [id number]  UPP: MUCP with barrier reduction was 25 cm of water.    MICTURITION STUDY: Voiding was performed with reduction using scopettes in the sitting position.  Pdet at Qmax was 25.2 cm of water.  Qmax was 14.7 mL/sec.  It was a intermittent pattern.  She voided 488 mL and had a residual of 240 mL.  It was a volitional void, small un-sustained detrusor contraction was present with significant straining. Abdominal straining was present  EMG: This was performed with patches.  She had voluntary contractions, recruitment with fill was present and urethral sphincter was not relaxed with void.  The details of the procedure with the study tracings have been scanned into EPIC.   Urodynamic Impression:  1. Sensation was normal; capacity was normal 2. Stress Incontinence was demonstrated at normal pressures; 3. Detrusor Overactivity was demonstrated without leakage. 4. Emptying was dysfunctional with a slightly elevated PVR (PVR 279m), a sustained detrusor contraction not present,  abdominal straining present, dyssynergic urethral sphincter activity on EMG.  Plan: - The patient will follow up  to discuss the findings and treatment options.

## 2022-04-28 NOTE — Patient Instructions (Signed)

## 2022-05-04 ENCOUNTER — Encounter (HOSPITAL_BASED_OUTPATIENT_CLINIC_OR_DEPARTMENT_OTHER): Payer: Self-pay | Admitting: Cardiology

## 2022-05-06 ENCOUNTER — Encounter: Payer: Self-pay | Admitting: Obstetrics and Gynecology

## 2022-05-06 ENCOUNTER — Ambulatory Visit (INDEPENDENT_AMBULATORY_CARE_PROVIDER_SITE_OTHER): Payer: Medicare HMO | Admitting: Obstetrics and Gynecology

## 2022-05-06 VITALS — BP 129/76 | HR 73

## 2022-05-06 DIAGNOSIS — N816 Rectocele: Secondary | ICD-10-CM

## 2022-05-06 DIAGNOSIS — N3281 Overactive bladder: Secondary | ICD-10-CM | POA: Diagnosis not present

## 2022-05-06 DIAGNOSIS — N393 Stress incontinence (female) (male): Secondary | ICD-10-CM

## 2022-05-06 DIAGNOSIS — N993 Prolapse of vaginal vault after hysterectomy: Secondary | ICD-10-CM

## 2022-05-06 NOTE — Progress Notes (Signed)
[facility] Return Visit  SUBJECTIVE  History of Present Illness: Shilo Philipson is a 82 y.o. female seen in follow-up for prolapse. She underwent urodynamic testing.   Urodynamic Impression:  1. Sensation was normal; capacity was normal 2. Stress Incontinence was demonstrated at normal pressures; 3. Detrusor Overactivity was demonstrated without leakage. 4. Emptying was dysfunctional with an elevated PVR (PVR 221m), a sustained detrusor contraction not present,  abdominal straining present, dyssynergic urethral sphincter activity on EMG.  Past Medical History: Patient  has a past medical history of Anxiety, Breast cancer, right breast ([place], Dermatitis, and Palpitations.   Past Surgical History: She  has a past surgical history that includes Colonoscopy; Breast Reconstruction with placement of Tissue Expander and Alloderm (Right, 09/24/2017); Carpal tunnel release (Bilateral); Breast biopsy (Right, 06/2017); Vaginal hysterectomy; Tubal ligation; Mastectomy w/ sentinel node biopsy (Right, 09/24/2017); Breast Reconstruction with placement of Tissue Expander and Alloderm (Right, 09/24/2017); Portacath placement (Left, 11/08/2017); Mastectomy complete / simple w/ sentinel node biopsy (Right, 09/24/2017); Removal of tissue expander and placement of implant (Right, 01/06/2019); Breast reduction surgery (Left, 01/06/2019); Port-a-cath removal (Left, 01/06/2019); Reduction mammaplasty (Left); and Amputation (Left, 08/02/2021).   Medications: She has a current medication list which includes the following prescription(s): alprazolam, ascorbic acid, beta carotene, calcium carbonate, vitamin d-3, diethylpropion hcl cr, lutein, multiple vitamins-minerals, probiotic product, red yeast rice, tamoxifen, vitamin e, and zinc gluconate.   Allergies: Patient has No Known Allergies.   Social History: Patient  reports that she has quit smoking. Her smoking use included cigarettes. She has  never used smokeless tobacco. She reports current alcohol use. She reports that she does not use drugs.      OBJECTIVE     Physical Exam: Vitals:   05/06/22 0937  BP: 129/76  Pulse: 73   Gen: No apparent distress, A&O x 3.  Detailed Urogynecologic Evaluation:  Deferred. Prior exam showed:  POP-Q (04/24/22)   -2                                            Aa   -2                                           Ba   -4.5                                              C    4                                            Gh   3.5                                            Pb   6.5  tvl    1                                            Ap   1                                            Bp                                                  D      ASSESSMENT AND PLAN    Ms. Garza is a 82 y.o. with:  1. Vaginal vault prolapse after hysterectomy   2. Prolapse of posterior vaginal wall   3. SUI (stress urinary incontinence, female)   4. Overactive bladder     - We reviewed the results of the urodynamic testing which showed SUI but also that she did not empty her bladder well. Recommended interval incontinence procedure.  - She reports that the majority of her leakage is with urgency. Will wait until after procedure to reassess bladder emptying then can prescribe OAB medication if needed.   Plan for surgery: Exam under anesthesia, Posterior repair, perineorrhaphy, sacrospinous ligament fixation, cystoscopy   - We reviewed the patient's specific anatomic and functional findings, with the assistance of diagrams, and together finalized the above procedure. The planned surgical procedures were discussed along with the surgical risks outlined below, which were also provided on a detailed handout. Additional treatment options including expectant management, conservative management, medical management were discussed where appropriate.  We reviewed the  benefits and risks of each treatment option.   General Surgical Risks: For all procedures, there are risks of bleeding, infection, damage to surrounding organs including but not limited to bowel, bladder, blood vessels, ureters and nerves, and need for further surgery if an injury were to occur. These risks are all low with minimally invasive surgery.   There are risks of numbness and weakness at any body site or buttock/rectal pain.  It is possible that baseline pain can be worsened by surgery, either with or without mesh. If surgery is vaginal, there is also a low risk of possible conversion to laparoscopy or open abdominal incision where indicated. Very rare risks include blood transfusion, blood clot, heart attack, pneumonia, or death.   There is also a risk of short-term postoperative urinary retention with need to use a catheter. About half of patients need to go home from surgery with a catheter, which is then later removed in the office. The risk of long-term need for a catheter is very low. There is also a risk of worsening of overactive bladder.   Prolapse (with or without mesh): Risk factors for surgical failure  include things that put pressure on your pelvis and the surgical repair, including obesity, chronic cough, and heavy lifting or straining (including lifting children or adults, straining on the toilet, or lifting heavy objects such as furniture or anything weighing >25 lbs. Risks of recurrence is 20-30% with vaginal native tissue repair and a less than 10% with sacrocolpopexy with mesh.    - For preop Visit:  She is required to have  a visit within 30 days of her surgery.    - Medical clearance: not required  - Anticoagulant use: No - Medicaid Hysterectomy form: n/a - Accepts blood transfusion: Yes - Expected length of stay: outpatient  Request sent for surgery scheduling.   Jaquita Folds, MD

## 2022-05-07 ENCOUNTER — Other Ambulatory Visit: Payer: Self-pay | Admitting: Hematology and Oncology

## 2022-05-11 NOTE — Progress Notes (Unsigned)
[facility] Pre-Operative  Subjective Chief Complaint: Lauren Holt presents for a preoperative encounter.   History of Present Illness: Lauren Holt is a 82 y.o. female who presents for preoperative visit.  She is scheduled to undergo Exam under anesthesia, Posterior repair, perineorrhaphy, sacrospinous ligament fixation, cystoscopy   on 05/21/2022.  Her symptoms include SUI and OAB, and she was was found to have Stage I anterior, Stage II posterior, Stage I apical prolapse.   Urodynamics showed: 1. Sensation was normal; capacity was normal 2. Stress Incontinence was demonstrated at normal pressures; 3. Detrusor Overactivity was demonstrated without leakage. 4. Emptying was dysfunctional with a slightly elevated PVR (PVR 273m), a sustained detrusor contraction not present,  abdominal straining present, dyssynergic urethral sphincter activity on EMG.  Past Medical History:  Diagnosis Date   Anxiety    [place], [place] breast ([facility]    Dermatitis    Palpitations    when she lays down, has not seen anyone for this, feels like a flutter     Past Surgical History:  Procedure Laterality Date   AMPUTATION Left 08/02/2021   Procedure: AMPUTATION DIGIT;  Surgeon: KLeanora Cover MD;  [place] [facility]  Service: Orthopedics;  Laterality: Left;   BREAST BIOPSY Right 06/2017   BREAST RECONSTRUCTION WITH PLACEMENT OF TISSUE EXPANDER AND ALLODERM Right 09/24/2017   BREAST RECONSTRUCTION WITH PLACEMENT OF TISSUE EXPANDER AND ALLODERM Right 09/24/2017   Procedure: BREAST RECONSTRUCTION WITH PLACEMENT OF TISSUE EXPANDER AND ALLODERM;  Surgeon: TIrene Limbo MD;  [facility] [facility]  Service: Plastics;  Laterality: Right;   BREAST REDUCTION SURGERY Left 01/06/2019   Procedure: LEFT REDUCTION  (BREAST);  Surgeon: TIrene Limbo MD;  [place] [facility]  Service: [place]astics;  Laterality: Left;   CARPAL TUNNEL RELEASE Bilateral    COLONOSCOPY     MASTECTOMY  COMPLETE / SIMPLE W/ SENTINEL NODE BIOPSY Right 09/24/2017   MASTECTOMY W/ SENTINEL NODE BIOPSY Right 09/24/2017   Procedure: MASTECTOMY WITH SENTINEL LYMPH NODE BIOPSY;  Surgeon: TJovita Kussmaul MD;  [place] [facility]  Service: General;  Laterality: Right;   PORT-A-CATH REMOVAL Left 01/06/2019   Procedure: REMOVAL LEFT CHEST PORT;  Surgeon: TIrene Limbo MD;  [place] [facility]  Service: [place];  Laterality: Left;   PORTACATH PLACEMENT Left 11/08/2017   Procedure: INSERTION PORT-A-CATH;  Surgeon: TJovita Kussmaul MD;  [place] [facility]  Service: General;  Laterality: Left;   REDUCTION MAMMAPLASTY Left    REMOVAL OF TISSUE EXPANDER AND PLACEMENT OF IMPLANT Right 01/06/2019   Procedure: REMOVAL OF RIGHT TISSUE EXPANDER AND PLACEMENT OF IMPLANT;  Surgeon: TIrene Limbo MD;  [place] [facility]  Service: Plastics;  Laterality: Right;   TUBAL LIGATION     VAGINAL HYSTERECTOMY     with "bladder tuck"    has No Known Allergies.   Family History  Problem Relation Age of Onset   Breast cancer Paternal A81   Breast cancer Cousin 381  Stroke Mother        caused blindness   Prostate cancer Father 870  Breast cancer Cousin 428  Breast cancer Cousin 25   Breast cancer Cousin 35   Breast cancer Other     Social History   Tobacco Use   Smoking status: Former    Years: 1.00    Types: Cigarettes   Smokeless tobacco: Never   Tobacco comments:    Smoked X 1 year at age 89(less than ppd)  V57  Use   Vaping Use: Never used  Substance Use Topics   Alcohol use: Yes    Comment: social   Drug use: Never     Review of Systems was negative for a full 10 system review except as noted in the History of Present Illness.   Current Outpatient Medications:    acetaminophen (TYLENOL) 500 MG tablet, Take 1 tablet (500 mg total) by mouth every 6 (six) hours as needed (pain)., Disp: 30 tablet, Rfl: 0   ALPRAZolam (XANAX) 0.25 MG tablet, Take 0.125 mg  by mouth 2 (two) times daily as needed for anxiety. , Disp: , Rfl:    ascorbic acid (VITAMIN C) 1000 MG tablet, Take 1 tablet by mouth daily., Disp: , Rfl:    BETA CAROTENE PO, Take 7,500 mcg by mouth daily., Disp: , Rfl:    calcium carbonate (CALCIUM 600) 600 MG TABS tablet, Take 600 mg by mouth daily., Disp: , Rfl:    Cholecalciferol (VITAMIN D-3) 5000 units TABS, Take 1 tablet by mouth daily., Disp: , Rfl:    Diethylpropion HCl CR 75 MG TB24, Take 1 tablet by mouth daily., Disp: , Rfl:    ibuprofen (ADVIL) 600 MG tablet, Take 1 tablet (600 mg total) by mouth every 6 (six) hours as needed., Disp: 30 tablet, Rfl: 0   Lutein 20 MG CAPS, Take 20 mg by mouth daily. , Disp: , Rfl:    Multiple Vitamins-Minerals (ICAPS AREDS FORMULA PO), Take 1 capsule by mouth 3 (three) times a week. , Disp: , Rfl:    oxyCODONE (OXY IR/ROXICODONE) 5 MG immediate release tablet, Take 1 tablet (5 mg total) by mouth every 4 (four) hours as needed for severe pain., Disp: 10 tablet, Rfl: 0   polyethylene glycol powder (GLYCOLAX/MIRALAX) 17 GM/SCOOP powder, Take 17 g by mouth daily. Drink 17g (1 scoop) dissolved in water per day., Disp: 255 g, Rfl: 0   Probiotic Product (PROBIOTIC DAILY PO), Take 1 capsule by mouth 3 (three) times a week. , Disp: , Rfl:    Red Yeast Rice 600 MG CAPS, Take 600 mg by mouth daily. , Disp: , Rfl:    tamoxifen (NOLVADEX) 20 MG tablet, TAKE 1 TABLET EVERY DAY, Disp: 90 tablet, Rfl: 3   VITAMIN E PO, Take 180 mg by mouth daily., Disp: , Rfl:    zinc gluconate 50 MG tablet, Take 50 mg by mouth daily as needed (cold prevention)., Disp: , Rfl:    Objective Vitals:   05/12/22 1238 05/12/22 1315  BP: (!) 144/76 139/69  Pulse: 78     Gen: NAD CV: S1 S2 RRR Lungs: Clear to auscultation bilaterally Abd: soft, nontender  Previous Pelvic Exam showed: Pelvic Exam: Normal external female genitalia; Bartholin's and Skene's glands normal in appearance; urethral meatus normal in appearance, no  urethral masses or discharge.    CST: negative   s/p hysterectomy: Speculum exam reveals normal vaginal mucosa with  atrophy and normal vaginal cuff.  Adnexa no mass, fullness, tenderness.     Pelvic floor strength III/V, puborectalis IV/V external anal sphincter IV/V   Pelvic floor musculature: Right levator non-tender, Right obturator non-tender, Left levator non-tender, Left obturator non-tender   POP-Q:    POP-Q   -2                                            Aa   -  2                                           Ba   -4.5                                              C    4                                            Gh   3.5                                            Pb   6.5                                            tvl    1                                            Ap   1                                            Bp                                                  D          Rectal Exam:  Normal sphincter tone, moderate distal rectocele, enterocoele present, no rectal masses, no sign of dyssynergia when asking the patient to bear down.    Assessment/ Plan  Assessment: The patient is a 82 y.o. year old scheduled to undergo exam under anesthesia, Posterior repair, perineorrhaphy, sacrospinous ligament fixation, cystoscopy  . Written consent was obtained for these procedures.  Plan: General Surgical Consent: The patient has previously been counseled on alternative treatments, and the decision by the patient and provider was to proceed with the procedure listed above.  For all procedures, there are risks of bleeding, infection, damage to surrounding organs including but not limited to bowel, bladder, blood vessels, ureters and nerves, and need for further surgery if an injury were to occur. These risks are all low with minimally invasive surgery.   There are risks of numbness and weakness at any body site or buttock/rectal pain.  It is possible that baseline pain  can be worsened by surgery, either with or without mesh. If surgery is vaginal, there is also a low risk of possible conversion to laparoscopy or open abdominal incision where indicated. Very rare risks include blood transfusion, blood clot, heart attack, pneumonia, or death.   There is also  a risk of short-term postoperative urinary retention with need to use a catheter. About half of patients need to go home from surgery with a catheter, which is then later removed in the office. The risk of long-term need for a catheter is very low. There is also a risk of worsening of overactive bladder.    Prolapse (with or without mesh): Risk factors for surgical failure  include things that put pressure on your pelvis and the surgical repair, including obesity, chronic cough, and heavy lifting or straining (including lifting children or adults, straining on the toilet, or lifting heavy objects such as furniture or anything weighing >25 lbs. Risks of recurrence is 20-30% with vaginal native tissue repair and a less than 10% with sacrocolpopexy with mesh.     We discussed consent for blood products. Risks for blood transfusion include allergic reactions, other reactions that can affect different body organs and managed accordingly, transmission of infectious diseases such as HIV or Hepatitis. However, the blood is screened. Patient consents for blood products.  Pre-operative instructions:  She was instructed to not take Aspirin/NSAIDs x 7days prior to surgery.   Catheter use: Patient will go home with foley if needed after post-operative voiding trial.  Post-operative instructions:  She was provided with specific post-operative instructions, including precautions and signs/symptoms for which we would recommend contacting us, in addition to daytime and after-hours contact phone numbers. This was provided on a handout.   Post-operative medications: Prescriptions for motrin, tylenol, miralax, and oxycodone were  sent to her pharmacy. Discussed using ibuprofen and tylenol on a schedule to limit use of narcotics.   Laboratory testing:  If needed by Anesthesia on day of surgery.   Preoperative clearance:  She does not require surgical clearance.    Post-operative follow-up:  A post-operative appointment will be made for 6 weeks from the date of surgery. If she needs a post-operative nurse visit for a voiding trial, that will be set up after she leaves the hospital.    Patient will call the clinic or use MyChart should anything change or any new issues arise.   Berton Mount, NP

## 2022-05-12 ENCOUNTER — Encounter: Payer: Self-pay | Admitting: Obstetrics and Gynecology

## 2022-05-12 ENCOUNTER — Ambulatory Visit (INDEPENDENT_AMBULATORY_CARE_PROVIDER_SITE_OTHER): Payer: Medicare HMO | Admitting: Obstetrics and Gynecology

## 2022-05-12 VITALS — BP 139/69 | HR 78

## 2022-05-12 DIAGNOSIS — N993 Prolapse of vaginal vault after hysterectomy: Secondary | ICD-10-CM | POA: Diagnosis not present

## 2022-05-12 DIAGNOSIS — Z01818 Encounter for other preprocedural examination: Secondary | ICD-10-CM

## 2022-05-12 MED ORDER — IBUPROFEN 600 MG PO TABS: 600.0000 mg | ORAL_TABLET | Freq: Four times a day (QID) | ORAL | 0 refills | Status: DC | PRN

## 2022-05-12 MED ORDER — ACETAMINOPHEN 500 MG PO TABS: 500.0000 mg | ORAL_TABLET | Freq: Four times a day (QID) | ORAL | 0 refills | Status: DC | PRN

## 2022-05-12 MED ORDER — OXYCODONE HCL 5 MG PO TABS: 5.0000 mg | ORAL_TABLET | ORAL | 0 refills | Status: DC | PRN

## 2022-05-12 MED ORDER — POLYETHYLENE GLYCOL 3350 17 GM/SCOOP PO POWD: 17.0000 g | Freq: Every day | ORAL | 0 refills | Status: DC

## 2022-05-12 NOTE — H&P (Addendum)
[facility] H&P Subjective  History of Present Illness: Lauren Holt is a 82 y.o. female who presents for preoperative visit.  She is scheduled to undergo Exam under anesthesia, Posterior repair, perineorrhaphy, sacrospinous ligament fixation, cystoscopy   on 05/21/2022.  Her symptoms include SUI and OAB, and she was was found to have Stage I anterior, Stage II posterior, Stage I apical prolapse.   Urodynamics showed: 1. Sensation was normal; capacity was normal 2. Stress Incontinence was demonstrated at normal pressures; 3. Detrusor Overactivity was demonstrated without leakage. 4. Emptying was dysfunctional with a slightly elevated PVR (PVR 260m), a sustained detrusor contraction not present,  abdominal straining present, dyssynergic urethral sphincter activity on EMG.  Past Medical History:  Diagnosis Date   Anxiety    [facility]    Dermatitis    Palpitations    when she lays down, has not seen anyone for this, feels like a flutter     Past Surgical History:  Procedure Laterality Date   AMPUTATION Left 08/02/2021   Procedure: AMPUTATION DIGIT;  Surgeon: KLeanora Cover MD;  [place] [facility]  Service: Orthopedics;  Laterality: Left;   BREAST BIOPSY Right 06/2017   BREAST RECONSTRUCTION WITH PLACEMENT OF TISSUE EXPANDER AND ALLODERM Right 09/24/2017   BREAST RECONSTRUCTION WITH PLACEMENT OF TISSUE EXPANDER AND ALLODERM Right 09/24/2017   Procedure: BREAST RECONSTRUCTION WITH PLACEMENT OF TISSUE EXPANDER AND ALLODERM;  Surgeon: TIrene Limbo MD;  [place] [facility]  Service: Plastics;  Laterality: Right;   BREAST REDUCTION SURGERY Left 01/06/2019   Procedure: LEFT REDUCTION  (BREAST);  Surgeon: TIrene Limbo MD;  [place] [facility]  Service: Plastics;  Laterality: Left;   CARPAL TUNNEL RELEASE Bilateral    COLONOSCOPY     MASTECTOMY COMPLETE / SIMPLE W/ SENTINEL NODE BIOPSY Right 09/24/2017   MASTECTOMY W/ SENTINEL  NODE BIOPSY Right 09/24/2017   Procedure: MASTECTOMY WITH SENTINEL LYMPH NODE BIOPSY;  Surgeon: TJovita Kussmaul MD;  [facility] [facility]  Service: General;  Laterality: Right;   PORT-A-CATH REMOVAL Left 01/06/2019   Procedure: REMOVAL LEFT CHEST PORT;  Surgeon: TIrene Limbo MD;  [place] [facility]  Service: Plastics;  Laterality: Left;   PORTACATH PLACEMENT Left 11/08/2017   Procedure: INSERTION PORT-A-CATH;  Surgeon: TJovita Kussmaul MD;  [place] [facility]  Service: General;  Laterality: Left;   REDUCTION MAMMAPLASTY Left    REMOVAL OF TISSUE EXPANDER AND PLACEMENT OF IMPLANT Right 01/06/2019   Procedure: REMOVAL OF RIGHT TISSUE EXPANDER AND PLACEMENT OF IMPLANT;  Surgeon: TIrene Limbo MD;  [place] [facility]  Service: Plastics;  Laterality: Right;   TUBAL LIGATION     VAGINAL HYSTERECTOMY     with "bladder tuck"    has No Known Allergies.   Family History  Problem Relation Age of Onset   Breast cancer Paternal A41   Breast cancer Cousin 327  Stroke Mother        caused blindness   Prostate cancer Father 865  Breast cancer Cousin 41  Breast cancer Cousin 25   Breast cancer Cousin 35   Breast cancer Other     Social History   Tobacco Use   Smoking status: Former    Years: 1.00    Types: Cigarettes   Smokeless tobacco: Never   Tobacco comments:    Smoked X 1 year at age 82(less than ppd)  Vaping Use   Vaping Use: Never used  Substance Use Topics  Alcohol use: Yes    Comment: social   Drug use: Never     Review of Systems was negative for a full 10 system review except as noted in the History of Present Illness.  No current facility-administered medications for this encounter.  Current Outpatient Medications:    acetaminophen (TYLENOL) 500 MG tablet, Take 1 tablet (500 mg total) by mouth every 6 (six) hours as needed (pain)., Disp: 30 tablet, Rfl: 0   ALPRAZolam (XANAX) 0.25 MG tablet, Take 0.125 mg by mouth 2 (two)  times daily as needed for anxiety. , Disp: , Rfl:    ascorbic acid (VITAMIN C) 1000 MG tablet, Take 1 tablet by mouth daily., Disp: , Rfl:    BETA CAROTENE PO, Take 7,500 mcg by mouth daily., Disp: , Rfl:    calcium carbonate (CALCIUM 600) 600 MG TABS tablet, Take 600 mg by mouth daily., Disp: , Rfl:    Cholecalciferol (VITAMIN D-3) 5000 units TABS, Take 1 tablet by mouth daily., Disp: , Rfl:    Diethylpropion HCl CR 75 MG TB24, Take 1 tablet by mouth daily., Disp: , Rfl:    ibuprofen (ADVIL) 600 MG tablet, Take 1 tablet (600 mg total) by mouth every 6 (six) hours as needed., Disp: 30 tablet, Rfl: 0   Lutein 20 MG CAPS, Take 20 mg by mouth daily. , Disp: , Rfl:    Multiple Vitamins-Minerals (ICAPS AREDS FORMULA PO), Take 1 capsule by mouth 3 (three) times a week. , Disp: , Rfl:    oxyCODONE (OXY IR/ROXICODONE) 5 MG immediate release tablet, Take 1 tablet (5 mg total) by mouth every 4 (four) hours as needed for severe pain., Disp: 10 tablet, Rfl: 0   polyethylene glycol powder (GLYCOLAX/MIRALAX) 17 GM/SCOOP powder, Take 17 g by mouth daily. Drink 17g (1 scoop) dissolved in water per day., Disp: 255 g, Rfl: 0   Probiotic Product (PROBIOTIC DAILY PO), Take 1 capsule by mouth 3 (three) times a week. , Disp: , Rfl:    Red Yeast Rice 600 MG CAPS, Take 600 mg by mouth daily. , Disp: , Rfl:    tamoxifen (NOLVADEX) 20 MG tablet, TAKE 1 TABLET EVERY DAY, Disp: 90 tablet, Rfl: 3   VITAMIN E PO, Take 180 mg by mouth daily., Disp: , Rfl:    zinc gluconate 50 MG tablet, Take 50 mg by mouth daily as needed (cold prevention)., Disp: , Rfl:    Objective  Gen: NAD CV: S1 S2 RRR Lungs: Clear to auscultation bilaterally Abd: soft, nontender  Previous Pelvic Exam showed:  Normal external female genitalia; Bartholin's and Skene's glands normal in appearance; urethral meatus normal in appearance, no urethral masses or discharge.    CST: negative   s/p hysterectomy: Speculum exam reveals normal vaginal mucosa  with  atrophy and normal vaginal cuff.  Adnexa no mass, fullness, tenderness.     Pelvic floor strength III/V, puborectalis IV/V external anal sphincter IV/V   Pelvic floor musculature: Right levator non-tender, Right obturator non-tender, Left levator non-tender, Left obturator non-tender   POP-Q:    POP-Q   -2                                            Aa   -2  Ba   -4.5                                              C    4                                            Gh   3.5                                            Pb   6.5                                            tvl    1                                            Ap   1                                            Bp                                                  D          Rectal Exam:  Normal sphincter tone, moderate distal rectocele, enterocoele present, no rectal masses, no sign of dyssynergia when asking the patient to bear down.    Assessment/ Plan  Assessment: The patient is a 82 y.o. year old scheduled to undergo exam under anesthesia, Posterior repair, perineorrhaphy, sacrospinous ligament fixation, cystoscopy  . Written consent was obtained for these procedures.

## 2022-05-14 ENCOUNTER — Encounter (HOSPITAL_BASED_OUTPATIENT_CLINIC_OR_DEPARTMENT_OTHER): Payer: Self-pay | Admitting: Obstetrics and Gynecology

## 2022-05-14 NOTE — Progress Notes (Signed)
Spoke w/ via phone for pre-op interview--- pt Lab needs dos---- no               Lab results------ current EKG in epic/ chart COVID test -----patient states asymptomatic no test needed Arrive at -------  0530 on 05-21-2022 NPO after MN NO Solid Food.  Clear liquids from MN until--- 0430 Med rec completed Medications to take morning of surgery ----- none Diabetic medication -----  n/a Patient instructed no nail polish to be worn day of surgery Patient instructed to bring photo id and insurance card day of surgery Patient aware to have Driver (ride ) / caregiver    for 24 hours after surgery -- son, Lauren Holt Patient Special Instructions ----- n/a Pre-Op special Istructions ----- n/a Patient verbalized understanding of instructions that were given at this phone interview. Patient denies shortness of breath, chest pain, fever, cough at this phone interview.

## 2022-05-20 NOTE — Anesthesia Preprocedure Evaluation (Signed)
Anesthesia Evaluation  Patient identified by MRN, date of birth, ID band Patient awake    Reviewed: Allergy & Precautions, NPO status , Patient's Chart, lab work & pertinent test results  History of Anesthesia Complications Negative for: history of anesthetic complications  Airway Mallampati: II  TM Distance: >3 FB Neck ROM: Full    Dental no notable dental hx. (+) Dental Advisory Given   Pulmonary former smoker   Pulmonary exam normal        Cardiovascular negative cardio ROS Normal cardiovascular exam     Neuro/Psych  PSYCHIATRIC DISORDERS Anxiety Depression     Neuromuscular disease    GI/Hepatic negative GI ROS, Neg liver ROS,,,  Endo/Other  Breast cancer  Renal/GU negative Renal ROS     Musculoskeletal  (+)  Fibromyalgia -  Abdominal   Peds  Hematology negative hematology ROS (+)   Anesthesia Other Findings Left ring finger Partial Amputation  Reproductive/Obstetrics                             Anesthesia Physical Anesthesia Plan  ASA: 2  Anesthesia Plan: General   Post-op Pain Management: Tylenol PO (pre-op)* and Toradol IV (intra-op)*   Induction: Intravenous  PONV Risk Score and Plan: 3 and Ondansetron, Dexamethasone, Treatment may vary due to age or medical condition and Diphenhydramine  Airway Management Planned: LMA and Oral ETT  Additional Equipment:   Intra-op Plan:   Post-operative Plan: Extubation in OR  Informed Consent: I have reviewed the patients History and Physical, chart, labs and discussed the procedure including the risks, benefits and alternatives for the proposed anesthesia with the patient or authorized representative who has indicated his/her understanding and acceptance.     Dental advisory given  Plan Discussed with: Anesthesiologist and CRNA  Anesthesia Plan Comments:         Anesthesia Quick Evaluation

## 2022-05-21 ENCOUNTER — Encounter (HOSPITAL_BASED_OUTPATIENT_CLINIC_OR_DEPARTMENT_OTHER)
Admission: RE | Disposition: A | Discharge status: Home / Self Care | Payer: Self-pay | Source: Home / Self Care | Attending: Obstetrics and Gynecology

## 2022-05-21 ENCOUNTER — Ambulatory Visit (HOSPITAL_BASED_OUTPATIENT_CLINIC_OR_DEPARTMENT_OTHER): Payer: Medicare HMO | Admitting: Anesthesiology

## 2022-05-21 ENCOUNTER — Other Ambulatory Visit: Payer: Self-pay

## 2022-05-21 ENCOUNTER — Encounter (HOSPITAL_BASED_OUTPATIENT_CLINIC_OR_DEPARTMENT_OTHER): Payer: Self-pay | Admitting: Obstetrics and Gynecology

## 2022-05-21 ENCOUNTER — Ambulatory Visit (HOSPITAL_BASED_OUTPATIENT_CLINIC_OR_DEPARTMENT_OTHER)
Admission: RE | Admit: 2022-05-21 | Discharge: 2022-05-21 | Disposition: A | Discharge status: Home / Self Care | Payer: Medicare HMO | Attending: Obstetrics and Gynecology | Admitting: Obstetrics and Gynecology

## 2022-05-21 DIAGNOSIS — F419 Anxiety disorder, unspecified: Secondary | ICD-10-CM | POA: Insufficient documentation

## 2022-05-21 DIAGNOSIS — N393 Stress incontinence (female) (male): Secondary | ICD-10-CM | POA: Insufficient documentation

## 2022-05-21 DIAGNOSIS — M1990 Unspecified osteoarthritis, unspecified site: Secondary | ICD-10-CM

## 2022-05-21 DIAGNOSIS — Z09 Encounter for follow-up examination after completed treatment for conditions other than malignant neoplasm: Secondary | ICD-10-CM | POA: Insufficient documentation

## 2022-05-21 DIAGNOSIS — Z01818 Encounter for other preprocedural examination: Secondary | ICD-10-CM

## 2022-05-21 DIAGNOSIS — R339 Retention of urine, unspecified: Secondary | ICD-10-CM | POA: Insufficient documentation

## 2022-05-21 DIAGNOSIS — Z90710 Acquired absence of both cervix and uterus: Secondary | ICD-10-CM | POA: Insufficient documentation

## 2022-05-21 DIAGNOSIS — N993 Prolapse of vaginal vault after hysterectomy: Secondary | ICD-10-CM

## 2022-05-21 DIAGNOSIS — Z853 Personal history of malignant neoplasm of breast: Secondary | ICD-10-CM | POA: Insufficient documentation

## 2022-05-21 DIAGNOSIS — Z87891 Personal history of nicotine dependence: Secondary | ICD-10-CM | POA: Diagnosis not present

## 2022-05-21 DIAGNOSIS — K449 Diaphragmatic hernia without obstruction or gangrene: Secondary | ICD-10-CM | POA: Diagnosis not present

## 2022-05-21 DIAGNOSIS — F418 Other specified anxiety disorders: Secondary | ICD-10-CM | POA: Insufficient documentation

## 2022-05-21 DIAGNOSIS — Z89022 Acquired absence of left finger(s): Secondary | ICD-10-CM | POA: Diagnosis not present

## 2022-05-21 HISTORY — PX: CYSTOSCOPY: SHX5120

## 2022-05-21 HISTORY — DX: Rectocele: N81.6

## 2022-05-21 HISTORY — DX: Unspecified hearing loss, left ear: H91.92

## 2022-05-21 HISTORY — DX: Generalized anxiety disorder: F41.1

## 2022-05-21 HISTORY — DX: Palpitations: R00.2

## 2022-05-21 HISTORY — PX: ANTERIOR AND POSTERIOR REPAIR WITH SACROSPINOUS FIXATION: SHX6536

## 2022-05-21 HISTORY — DX: Presence of spectacles and contact lenses: Z97.3

## 2022-05-21 HISTORY — DX: Stress incontinence (female) (male): N39.3

## 2022-05-21 HISTORY — DX: Prolapse of vaginal vault after hysterectomy: N99.3

## 2022-05-21 HISTORY — DX: Unspecified osteoarthritis, unspecified site: M19.90

## 2022-05-21 HISTORY — DX: Personal history of antineoplastic chemotherapy: Z92.21

## 2022-05-21 SURGERY — ANTERIOR AND POSTERIOR REPAIR WITH SACROSPINOUS FIXATION
Anesthesia: General | Site: Vagina

## 2022-05-21 MED ORDER — FENTANYL CITRATE (PF) 100 MCG/2ML IJ SOLN
INTRAMUSCULAR | Status: DC | PRN
  Administered 2022-05-21 (×4): 25 ug via INTRAVENOUS

## 2022-05-21 MED ORDER — LACTATED RINGERS IV SOLN
INTRAVENOUS | Status: DC
  Administered 2022-05-21: 1000 mL via INTRAVENOUS

## 2022-05-21 MED ORDER — KETOROLAC TROMETHAMINE 30 MG/ML IJ SOLN
INTRAMUSCULAR | Status: DC | PRN
  Administered 2022-05-21: 15 mg via INTRAVENOUS

## 2022-05-21 MED ORDER — FENTANYL CITRATE (PF) 100 MCG/2ML IJ SOLN
INTRAMUSCULAR | Status: AC
  Filled 2022-05-21: qty 2

## 2022-05-21 MED ORDER — ACETAMINOPHEN 500 MG PO TABS
1000.0000 mg | ORAL_TABLET | Freq: Once | ORAL | Status: AC
  Administered 2022-05-21: 1000 mg via ORAL

## 2022-05-21 MED ORDER — AMISULPRIDE (ANTIEMETIC) 5 MG/2ML IV SOLN: 10.0000 mg | Freq: Once | INTRAVENOUS | Status: DC | PRN

## 2022-05-21 MED ORDER — LIDOCAINE-EPINEPHRINE 1 %-1:100000 IJ SOLN
INTRAMUSCULAR | Status: DC | PRN
  Administered 2022-05-21: 20 mL

## 2022-05-21 MED ORDER — DEXAMETHASONE SODIUM PHOSPHATE 4 MG/ML IJ SOLN
INTRAMUSCULAR | Status: DC | PRN
  Administered 2022-05-21: 5 mg via INTRAVENOUS

## 2022-05-21 MED ORDER — LIDOCAINE HCL (PF) 2 % IJ SOLN
INTRAMUSCULAR | Status: AC
  Filled 2022-05-21: qty 5

## 2022-05-21 MED ORDER — 0.9 % SODIUM CHLORIDE (POUR BTL) OPTIME
TOPICAL | Status: DC | PRN
  Administered 2022-05-21: 500 mL

## 2022-05-21 MED ORDER — ONDANSETRON HCL 4 MG/2ML IJ SOLN
INTRAMUSCULAR | Status: DC | PRN
  Administered 2022-05-21: 4 mg via INTRAVENOUS

## 2022-05-21 MED ORDER — CEFAZOLIN SODIUM-DEXTROSE 2-4 GM/100ML-% IV SOLN
INTRAVENOUS | Status: AC
  Filled 2022-05-21: qty 100

## 2022-05-21 MED ORDER — PROPOFOL 10 MG/ML IV BOLUS
INTRAVENOUS | Status: DC | PRN
  Administered 2022-05-21: 130 mg via INTRAVENOUS

## 2022-05-21 MED ORDER — ACETAMINOPHEN 500 MG PO TABS
ORAL_TABLET | ORAL | Status: AC
  Filled 2022-05-21: qty 2

## 2022-05-21 MED ORDER — POVIDONE-IODINE 10 % EX SWAB: 2.0000 | Freq: Once | CUTANEOUS | Status: DC

## 2022-05-21 MED ORDER — LIDOCAINE HCL (CARDIAC) PF 100 MG/5ML IV SOSY
PREFILLED_SYRINGE | INTRAVENOUS | Status: DC | PRN
  Administered 2022-05-21: 60 mg via INTRAVENOUS

## 2022-05-21 MED ORDER — CEFAZOLIN SODIUM-DEXTROSE 2-4 GM/100ML-% IV SOLN
2.0000 g | INTRAVENOUS | Status: AC
  Administered 2022-05-21: 2 g via INTRAVENOUS

## 2022-05-21 MED ORDER — PHENYLEPHRINE HCL (PRESSORS) 10 MG/ML IV SOLN
INTRAVENOUS | Status: DC | PRN
  Administered 2022-05-21: 80 ug via INTRAVENOUS
  Administered 2022-05-21 (×2): 40 ug via INTRAVENOUS
  Administered 2022-05-21 (×3): 80 ug via INTRAVENOUS

## 2022-05-21 MED ORDER — SODIUM CHLORIDE 0.9 % IR SOLN
Status: DC | PRN
  Administered 2022-05-21: 1000 mL via INTRAVESICAL

## 2022-05-21 MED ORDER — FENTANYL CITRATE (PF) 100 MCG/2ML IJ SOLN
25.0000 ug | INTRAMUSCULAR | Status: DC | PRN
  Administered 2022-05-21: 25 ug via INTRAVENOUS

## 2022-05-21 MED ORDER — OXYCODONE HCL 5 MG PO TABS
ORAL_TABLET | ORAL | Status: AC
  Filled 2022-05-21: qty 1

## 2022-05-21 MED ORDER — PROMETHAZINE HCL 25 MG/ML IJ SOLN: 6.2500 mg | INTRAMUSCULAR | Status: DC | PRN

## 2022-05-21 MED ORDER — OXYCODONE HCL 5 MG PO TABS
5.0000 mg | ORAL_TABLET | Freq: Once | ORAL | Status: AC
  Administered 2022-05-21: 5 mg via ORAL

## 2022-05-21 MED ORDER — EPHEDRINE SULFATE (PRESSORS) 50 MG/ML IJ SOLN
INTRAMUSCULAR | Status: DC | PRN
  Administered 2022-05-21 (×3): 5 mg via INTRAVENOUS

## 2022-05-21 MED ORDER — ENOXAPARIN SODIUM 40 MG/0.4ML IJ SOSY
PREFILLED_SYRINGE | INTRAMUSCULAR | Status: AC
  Filled 2022-05-21: qty 0.4

## 2022-05-21 MED ORDER — ONDANSETRON HCL 4 MG/2ML IJ SOLN
INTRAMUSCULAR | Status: AC
  Filled 2022-05-21: qty 2

## 2022-05-21 MED ORDER — DEXAMETHASONE SODIUM PHOSPHATE 10 MG/ML IJ SOLN
INTRAMUSCULAR | Status: AC
  Filled 2022-05-21: qty 1

## 2022-05-21 MED ORDER — ENOXAPARIN SODIUM 40 MG/0.4ML IJ SOSY
40.0000 mg | PREFILLED_SYRINGE | INTRAMUSCULAR | Status: AC
  Administered 2022-05-21: 40 mg via SUBCUTANEOUS

## 2022-05-21 SURGICAL SUPPLY — 37 items
AGENT HMST KT MTR STRL THRMB (HEMOSTASIS)
BLADE CLIPPER SENSICLIP SURGIC (BLADE) IMPLANT
BLADE SURG 15 STRL LF DISP TIS (BLADE) ×3 IMPLANT
BLADE SURG 15 STRL SS (BLADE) ×2
DEVICE CAPIO SLIM SINGLE (INSTRUMENTS) IMPLANT
ELECT REM PT RETURN 9FT ADLT (ELECTROSURGICAL) ×2
ELECTRODE REM PT RTRN 9FT ADLT (ELECTROSURGICAL) IMPLANT
GAUZE 4X4 16PLY ~~LOC~~+RFID DBL (SPONGE) IMPLANT
GLOVE BIOGEL PI IND STRL 6.5 (GLOVE) ×3 IMPLANT
GLOVE BIOGEL PI IND STRL 7.0 (GLOVE) ×3 IMPLANT
GLOVE ECLIPSE 6.0 STRL STRAW (GLOVE) ×3 IMPLANT
GOWN STRL REUS W/TWL LRG LVL3 (GOWN DISPOSABLE) ×3 IMPLANT
HIBICLENS CHG 4% 4OZ BTL (MISCELLANEOUS) ×3 IMPLANT
HOLDER FOLEY CATH W/STRAP (MISCELLANEOUS) ×3 IMPLANT
KIT TURNOVER CYSTO (KITS) ×3 IMPLANT
MANIFOLD NEPTUNE II (INSTRUMENTS) ×3 IMPLANT
NDL MAYO 6 CRC TAPER PT (NEEDLE) IMPLANT
NEEDLE HYPO 22GX1.5 SAFETY (NEEDLE) ×3 IMPLANT
NEEDLE MAYO 6 CRC TAPER PT (NEEDLE) ×2 IMPLANT
NS IRRIG 1000ML POUR BTL (IV SOLUTION) ×3 IMPLANT
PACK CYSTO (CUSTOM PROCEDURE TRAY) ×3 IMPLANT
PACK VAGINAL WOMENS (CUSTOM PROCEDURE TRAY) ×3 IMPLANT
PAD OB MATERNITY 4.3X12.25 (PERSONAL CARE ITEMS) ×3 IMPLANT
RETRACTOR LONE STAR DISPOSABLE (INSTRUMENTS) ×3 IMPLANT
RETRACTOR STAY HOOK 5MM (MISCELLANEOUS) ×3 IMPLANT
SET IRRIG Y TYPE TUR BLADDER L (SET/KITS/TRAYS/PACK) ×3 IMPLANT
SPIKE FLUID TRANSFER (MISCELLANEOUS) IMPLANT
SURGIFLO W/THROMBIN 8M KIT (HEMOSTASIS) IMPLANT
SUT ABS MONO DBL WITH NDL 48IN (SUTURE) IMPLANT
SUT VIC AB 0 CT1 27 (SUTURE)
SUT VIC AB 0 CT1 27XBRD ANBCTR (SUTURE) IMPLANT
SUT VIC AB 2-0 SH 27 (SUTURE)
SUT VIC AB 2-0 SH 27XBRD (SUTURE) IMPLANT
SUT VICRYL 2-0 SH 8X27 (SUTURE) ×3 IMPLANT
SYR BULB EAR ULCER 3OZ GRN STR (SYRINGE) ×3 IMPLANT
TOWEL OR 17X26 10 PK STRL BLUE (TOWEL DISPOSABLE) ×3 IMPLANT
TRAY FOLEY W/BAG SLVR 14FR LF (SET/KITS/TRAYS/PACK) ×3 IMPLANT

## 2022-05-21 NOTE — Anesthesia Procedure Notes (Signed)
Procedure Name: LMA Insertion Date/Time: 05/21/2022 7:42 AM  Performed by: Georgeanne Nim, CRNAPre-anesthesia Checklist: Patient identified, Emergency Drugs available, Suction available, Patient being monitored and Timeout performed Patient Re-evaluated:Patient Re-evaluated prior to induction Oxygen Delivery Method: Circle system utilized Preoxygenation: Pre-oxygenation with 100% oxygen Induction Type: IV induction Ventilation: Mask ventilation without difficulty LMA: LMA inserted LMA Size: 3.0 Number of attempts: 1 Placement Confirmation: breath sounds checked- equal and bilateral and positive ETCO2 Tube secured with: Tape Dental Injury: Teeth and Oropharynx as per pre-operative assessment

## 2022-05-21 NOTE — Discharge Instructions (Addendum)

## 2022-05-21 NOTE — Anesthesia Postprocedure Evaluation (Signed)
Anesthesia Post Note  Patient: Lauren Holt  Procedure(s) Performed: POSTERIOR REPAIR WITH PERINEORRHAPHY AND SACROSPINOUS FIXATION CYSTOSCOPY     Patient location during evaluation: PACU Anesthesia Type: General Level of consciousness: sedated Pain management: pain level controlled Vital Signs Assessment: post-procedure vital signs reviewed and stable Respiratory status: spontaneous breathing and respiratory function stable Cardiovascular status: stable Postop Assessment: no apparent nausea or vomiting Anesthetic complications: no   No notable events documented.  Last Vitals:  Vitals:   05/21/22 0915 05/21/22 0930  BP: (!) 119/58 113/60  Pulse: 72 69  Resp: 12 12  Temp:    SpO2: 94% 97%    Last Pain:  Vitals:   05/21/22 0930  TempSrc:   PainSc: Asleep                 Mariano Doshi DANIEL

## 2022-05-21 NOTE — Transfer of Care (Signed)
Immediate Anesthesia Transfer of Care Note  Patient: Lauren Holt  Procedure(s) Performed: POSTERIOR REPAIR WITH PERINEORRHAPHY AND SACROSPINOUS FIXATION CYSTOSCOPY  Patient Location: PACU  Anesthesia Type:General  Level of Consciousness: awake and patient cooperative  Airway & Oxygen Therapy: Patient Spontanous Breathing and Patient connected to nasal cannula oxygen  Post-op Assessment: Report given to RN and Post -op Vital signs reviewed and stable  Post vital signs: Reviewed and stable  Last Vitals:  Vitals Value Taken Time  BP    Temp    Pulse    Resp    SpO2      Last Pain:  Vitals:   05/21/22 0609  TempSrc: Oral  PainSc: 0-No pain      Patients Stated Pain Goal: 5 (26/94/85 [id number])  Complications: No notable events documented.

## 2022-05-21 NOTE — Op Note (Signed)
Operative Note  Preoperative Diagnosis: posterior vaginal prolapse, vaginal vault prolapse after hysterectomy, and incomplete bladder emptying  Postoperative Diagnosis: same  Procedures performed:  Posterior repair, perineorrhaphy, enterocele repair, sacrospinous ligament fixation, cystoscopy  Implants: none  Attending Surgeon: Sherlene Shams, MD  Anesthesia: General LMA  Findings: 1. On vaginal exam, posterior and apical prolapse noted.   2. On cystoscopy, normal bladder and urethra without injury, lesion or foreign body. Brisk bilateral ureteral efflux noted.    Specimens: none  Estimated blood loss: 30 mL  IV fluids: 150 mL  Urine output: see flowsheet  Complications: none  Procedure in Detail:  After informed consent was obtained, the patient was taken to the operating room where anesthesia was induced and found to be adequate. She was placed in dorsal lithotomy position, taking care to avoid any traction on the extremities, and then prepped and draped in the usual sterile fashion. A self-retaining lonestar retractor was placed using four elastic blue stays.  After a foley catheter was inserted into the urethra.  Vaginal exam concluded that an apical procedure was also needed. Two Allis clamps were along the posterior vaginal wall defect and at the introitus. 1% lidocaine with epinephrine was injected into the vaginal mucosa.  A vertical incision was made between these two Allis clamps with a 15 blade scalpel and a diamond shaped incision was made over the perineum. Excess perineal skin was removed.  Allis clamps were placed along this incision and Metzenbaum scissors were used to undermine the vaginal mucosa along the incision.  The vaginal mucosa was then sharply dissected off to the septum bilaterally.    For the sacrospinous ligament fixation (SSLF), the ischial spine was accessed on the right side via dissection with scissors and blunt dissection.  The sacrospinous  ligament was palpated. Two 0 PDS suture was then placed at the sacrospinous ligament two fingerbreadths medial to the ischial spine, in order to avoid the pudendal neurovascular bundle, using a Capio needle driver.  The PDS suture was attached to the vaginal epithelium on the ipsilateral side of the vaginal apex and held. Enterocele was reduced with serial pursestring sutures of 2-0 vicryl. Posterior plication of the rectovaginal septum was then performed using plicating sutures of 2-0 Vicryl. The last distal stitch incorporated the perineal body in a U stitch fashion. The vaginal mucosal edges were trimmed and the incision reapproximated with 2-0 Vicryl in a running fashion. The SSLF suture was then tied down with excellent support of the posterior and apical vagina.  The perineal body was then reapproximated with two interrupted 0-vicryl sutures. The perineal skin was then closed with a 2-0 vicryl in a running fashion. Irrigation was performed and good hemostasis was noted.    Decision was made to perform cystoscopy due to history of incomplete bladder emptying. The Foley catheter was removed.  A 70-degree cystoscope was introduced, and 360-degree inspection revealed no trauma, lesions or foreign bodies in the bladder or urethra. Brisk bilateral ureteral efflux was noted.  The bladder was drained and the cystoscope was removed.  The Foley catheter was reinserted.   A rectal examination was normal and confirmed no sutures within the rectum. The patient tolerated the procedure well.  She was awakened from anesthesia and transferred to the recovery room in stable condition. All counts were correct x 2.    Jaquita Folds, MD

## 2022-05-21 NOTE — Interval H&P Note (Signed)
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  The various methods of treatment have been discussed with the patient and family. After consideration of risks, benefits and other options for treatment, the patient has consented to  Procedure(s) with comments: POSTERIOR REPAIR WITH PERINEORRHAPHY AND SACROSPINOUS FIXATION (N/A)  CYSTOSCOPY (N/A) as a surgical intervention.  The patient's history has been reviewed, patient examined, no change in status, stable for surgery.  I have reviewed the patient's chart and labs.  Questions were answered to the patient's satisfaction.     Jaquita Folds

## 2022-05-22 NOTE — Telephone Encounter (Signed)
Lauren Holt underwent Posterior repair, perineorrhaphy, enterocele repair, sacrospinous ligament fixation, cystoscopy on 05/21/22.   She passed her voiding trial.  338m was backfilled into the bladder Voided 3214m PVR by bladder scan was 65m26m  She was discharged without a catheter. Please call her for a routine post op check. Thanks!  MicJaquita FoldsD

## 2022-05-22 NOTE — Telephone Encounter (Signed)
Patient reports she is doing well. Is taking medication for pain and reports it is well controlled.  Patient reports she has been able to urinate appropriately.  Reports she has not yet had a bowel movement. Is taking miralax and is aware to take milk of magnesia over the weekend to assist in a BM if she has not had one.   Reports minimal bleeding.  Reports she is feeling pressure with coughing but was splinting to reduce pressure at suture region.  Also endorses walking up stairs in her home. We discussed taking these slowly and carefully so as not to overextend and cause pelvic pressure.

## 2022-05-25 ENCOUNTER — Encounter (HOSPITAL_BASED_OUTPATIENT_CLINIC_OR_DEPARTMENT_OTHER): Payer: Self-pay | Admitting: Obstetrics and Gynecology

## 2022-06-01 ENCOUNTER — Encounter: Payer: Self-pay | Admitting: *Deleted

## 2022-06-30 ENCOUNTER — Encounter: Payer: Self-pay | Admitting: Obstetrics and Gynecology

## 2022-06-30 ENCOUNTER — Ambulatory Visit (INDEPENDENT_AMBULATORY_CARE_PROVIDER_SITE_OTHER): Payer: Medicare HMO | Admitting: Obstetrics and Gynecology

## 2022-06-30 ENCOUNTER — Other Ambulatory Visit (HOSPITAL_COMMUNITY)
Admission: RE | Admit: 2022-06-30 | Discharge: 2022-06-30 | Disposition: A | Discharge status: Home / Self Care | Payer: Medicare HMO | Source: Ambulatory Visit | Attending: Obstetrics and Gynecology | Admitting: Obstetrics and Gynecology

## 2022-06-30 VITALS — BP 128/76 | HR 76

## 2022-06-30 DIAGNOSIS — R82998 Other abnormal findings in urine: Secondary | ICD-10-CM | POA: Diagnosis present

## 2022-06-30 DIAGNOSIS — Z9889 Other specified postprocedural states: Secondary | ICD-10-CM

## 2022-06-30 DIAGNOSIS — N952 Postmenopausal atrophic vaginitis: Secondary | ICD-10-CM

## 2022-06-30 DIAGNOSIS — R350 Frequency of micturition: Secondary | ICD-10-CM

## 2022-06-30 DIAGNOSIS — N3281 Overactive bladder: Secondary | ICD-10-CM | POA: Diagnosis not present

## 2022-06-30 LAB — POCT URINALYSIS DIPSTICK
Bilirubin, UA: NEGATIVE
Glucose, UA: NEGATIVE
Ketones, UA: NEGATIVE
Nitrite, UA: NEGATIVE
Protein, UA: NEGATIVE
Spec Grav, UA: 1.02 (ref 1.010–1.025)
Urobilinogen, UA: 0.2 E.U./dL
pH, UA: 7 (ref 5.0–8.0)

## 2022-06-30 MED ORDER — ESTRADIOL 0.1 MG/GM VA CREA: TOPICAL_CREAM | VAGINAL | 11 refills | Status: DC

## 2022-06-30 MED ORDER — VIBEGRON 75 MG PO TABS: 75.0000 mg | ORAL_TABLET | Freq: Every day | ORAL | 5 refills | Status: DC

## 2022-06-30 NOTE — Progress Notes (Signed)
[facility]  Date of Visit: 06/30/2022  History of Present Illness: Ms. Lauren Holt is a 82 y.o. female scheduled today for a post-operative visit.   Surgery: s/p Posterior repair, perineorrhaphy, enterocele repair, sacrospinous ligament fixation, cystoscopy on 05/21/22  She passed her postoperative void trial.   Postoperative course has been uncomplicated.   Today she reports she has a sore at the opening of the vagina- has been putting neosporin. Has some discharge which is causing wetness. Sometimes has seen a pink tinge. She rinses with a peri bottle.   UTI in the last 6 weeks? No  but has felt a little pressure in the bladder recentely Pain? No  She has returned to her normal activity (except for postop restrictions) Vaginal bulge? No  Stress incontinence: No  Urgency/frequency: No  Urge incontinence: Yes - 1-2 times per day if she if she has a full bladder, esp when she gets out of bed in the morning.  Voiding dysfunction:  sometimes has some hesitation with starting urine stream but feels she empties well.  Bowel issues: No - did take miralax and magnesium  Subjective Success: Do you usually have a bulge or something falling out that you can see or feel in the vaginal area? No  Retreatment Success: Any retreatment with surgery or pessary for any compartment? No    Medications: She has a current medication list which includes the following prescription(s): alprazolam, vitamin c, calcium carb-cholecalciferol, carboxymethylcellulose sodium, vitamin d-3, diethylpropion hcl cr, diphenhydramine, [START ON 07/02/2022] estradiol, lutein, multiple vitamins-minerals, NON FORMULARY, probiotic product, red yeast rice, tamoxifen, vibegron, vitamin e, and zinc gluconate.   Allergies: Patient has No Known Allergies.   Physical Exam: BP 128/76   Pulse 76    Pt points to introitus as area that feels irritated. Sutures present but no erythema or discharge. Pelvic Examination: Vagina:  Incisions healing well. Sutures are present at the apex. No tenderness along the anterior or posterior vagina. No apical tenderness. No pelvic masses.   POP-Q: POP-Q  -2                                            Aa   -2                                           Ba  -5.5                                              C   2                                            Gh  4                                            Pb  5.5  tvl   -3                                            Ap  -3                                            Bp                                                 D    PVR: 36ml POC urine: small leukocytes ---------------------------------------------------------  Assessment and Plan:  1. Post-operative state   2. Vaginal atrophy   3. Overactive bladder   4. Urinary frequency   5. Leukocytes in urine     - Healing well overall post-operatively.  - Since she is having irritation at the introitus, will start vaginal estrogen cream nightly for two weeks then twice a week after. Rx sent. Can check incision at next visit.  - Can resume regular activity including exercise.  - For OAB symptoms, will start Gemtesa 75mg  daily. 2 weeks of samples provided. She will return 6 weeks to follow up on medication.   Jaquita Folds, MD

## 2022-07-01 LAB — URINE CULTURE: Culture: NO GROWTH

## 2022-07-07 ENCOUNTER — Ambulatory Visit: Payer: Medicare HMO

## 2022-08-11 ENCOUNTER — Ambulatory Visit: Payer: Medicare HMO | Admitting: Obstetrics and Gynecology

## 2022-08-20 ENCOUNTER — Ambulatory Visit
Admission: RE | Admit: 2022-08-20 | Discharge: 2022-08-20 | Disposition: A | Discharge status: Home / Self Care | Payer: Medicare HMO | Source: Ambulatory Visit | Attending: Hematology and Oncology | Admitting: Hematology and Oncology

## 2022-08-20 DIAGNOSIS — Z170 Estrogen receptor positive status [ER+]: Secondary | ICD-10-CM

## 2022-12-15 ENCOUNTER — Other Ambulatory Visit: Payer: Self-pay | Admitting: Family Medicine

## 2022-12-15 DIAGNOSIS — E2839 Other primary ovarian failure: Secondary | ICD-10-CM

## 2023-06-01 ENCOUNTER — Other Ambulatory Visit: Payer: Self-pay | Admitting: Hematology and Oncology

## 2023-06-08 ENCOUNTER — Other Ambulatory Visit: Payer: Self-pay | Admitting: Internal Medicine

## 2023-06-08 DIAGNOSIS — Z1231 Encounter for screening mammogram for malignant neoplasm of breast: Secondary | ICD-10-CM

## 2023-06-24 ENCOUNTER — Ambulatory Visit
Admission: RE | Admit: 2023-06-24 | Discharge: 2023-06-24 | Disposition: A | Discharge status: Home / Self Care | Payer: Medicare HMO | Source: Ambulatory Visit | Attending: Family Medicine | Admitting: Family Medicine

## 2023-06-24 DIAGNOSIS — E2839 Other primary ovarian failure: Secondary | ICD-10-CM

## 2023-07-07 ENCOUNTER — Other Ambulatory Visit: Payer: Self-pay | Admitting: Internal Medicine

## 2023-07-07 DIAGNOSIS — M6790 Unspecified disorder of synovium and tendon, unspecified site: Secondary | ICD-10-CM

## 2023-07-22 ENCOUNTER — Ambulatory Visit
Admission: RE | Admit: 2023-07-22 | Discharge: 2023-07-22 | Disposition: A | Discharge status: Home / Self Care | Source: Ambulatory Visit | Attending: Internal Medicine | Admitting: Internal Medicine

## 2023-07-22 DIAGNOSIS — M6790 Unspecified disorder of synovium and tendon, unspecified site: Secondary | ICD-10-CM

## 2023-08-18 ENCOUNTER — Encounter (HOSPITAL_COMMUNITY): Payer: Self-pay

## 2023-08-24 ENCOUNTER — Other Ambulatory Visit: Payer: Self-pay | Admitting: Hematology and Oncology

## 2023-08-25 ENCOUNTER — Ambulatory Visit
Admission: RE | Admit: 2023-08-25 | Discharge: 2023-08-25 | Disposition: A | Discharge status: Home / Self Care | Payer: Medicare HMO | Source: Ambulatory Visit | Attending: Internal Medicine | Admitting: Internal Medicine

## 2023-08-25 DIAGNOSIS — Z1231 Encounter for screening mammogram for malignant neoplasm of breast: Secondary | ICD-10-CM

## 2023-08-26 ENCOUNTER — Telehealth: Payer: Self-pay | Admitting: *Deleted

## 2023-08-26 NOTE — Telephone Encounter (Signed)
 See My Chart message sent by this RN

## 2023-09-13 ENCOUNTER — Ambulatory Visit: Admitting: Hematology and Oncology

## 2023-09-28 ENCOUNTER — Telehealth: Payer: Self-pay

## 2023-09-28 NOTE — Telephone Encounter (Signed)
 Verbally confirmed appt for 6/19

## 2023-09-30 ENCOUNTER — Inpatient Hospital Stay

## 2023-09-30 ENCOUNTER — Inpatient Hospital Stay: Attending: Hematology and Oncology | Admitting: Hematology and Oncology

## 2023-09-30 VITALS — BP 120/62 | HR 75 | Temp 98.2°F | Resp 17 | Wt 158.7 lb

## 2023-09-30 DIAGNOSIS — C50311 Malignant neoplasm of lower-inner quadrant of right female breast: Secondary | ICD-10-CM

## 2023-09-30 DIAGNOSIS — D0511 Intraductal carcinoma in situ of right breast: Secondary | ICD-10-CM | POA: Insufficient documentation

## 2023-09-30 DIAGNOSIS — Z170 Estrogen receptor positive status [ER+]: Secondary | ICD-10-CM

## 2023-09-30 DIAGNOSIS — K623 Rectal prolapse: Secondary | ICD-10-CM | POA: Diagnosis not present

## 2023-09-30 DIAGNOSIS — Z79810 Long term (current) use of selective estrogen receptor modulators (SERMs): Secondary | ICD-10-CM | POA: Insufficient documentation

## 2023-09-30 DIAGNOSIS — Z87891 Personal history of nicotine dependence: Secondary | ICD-10-CM | POA: Diagnosis not present

## 2023-09-30 DIAGNOSIS — Z9011 Acquired absence of right breast and nipple: Secondary | ICD-10-CM | POA: Insufficient documentation

## 2023-09-30 DIAGNOSIS — M8580 Other specified disorders of bone density and structure, unspecified site: Secondary | ICD-10-CM | POA: Insufficient documentation

## 2023-09-30 LAB — CBC WITH DIFFERENTIAL/PLATELET
Abs Immature Granulocytes: 0.01 10*3/uL (ref 0.00–0.07)
Basophils Absolute: 0 10*3/uL (ref 0.0–0.1)
Basophils Relative: 1 %
Eosinophils Absolute: 0.1 10*3/uL (ref 0.0–0.5)
Eosinophils Relative: 1 %
HCT: 39.3 % (ref 36.0–46.0)
Hemoglobin: 13.1 g/dL (ref 12.0–15.0)
Immature Granulocytes: 0 %
Lymphocytes Relative: 37 %
Lymphs Abs: 1.6 10*3/uL (ref 0.7–4.0)
MCH: 32 pg (ref 26.0–34.0)
MCHC: 33.3 g/dL (ref 30.0–36.0)
MCV: 96.1 fL (ref 80.0–100.0)
Monocytes Absolute: 0.3 10*3/uL (ref 0.1–1.0)
Monocytes Relative: 8 %
Neutro Abs: 2.3 10*3/uL (ref 1.7–7.7)
Neutrophils Relative %: 53 %
Platelets: 216 10*3/uL (ref 150–400)
RBC: 4.09 MIL/uL (ref 3.87–5.11)
RDW: 12.2 % (ref 11.5–15.5)
WBC: 4.4 10*3/uL (ref 4.0–10.5)
nRBC: 0 % (ref 0.0–0.2)

## 2023-09-30 LAB — CMP (CANCER CENTER ONLY)
ALT: 10 U/L (ref 0–44)
AST: 17 U/L (ref 15–41)
Albumin: 4.1 g/dL (ref 3.5–5.0)
Alkaline Phosphatase: 51 U/L (ref 38–126)
Anion gap: 5 (ref 5–15)
BUN: 13 mg/dL (ref 8–23)
CO2: 28 mmol/L (ref 22–32)
Calcium: 9 mg/dL (ref 8.9–10.3)
Chloride: 106 mmol/L (ref 98–111)
Creatinine: 0.65 mg/dL (ref 0.44–1.00)
GFR, Estimated: >60 mL/min (ref >60)
Glucose, Bld: 100 mg/dL — ABNORMAL HIGH (ref 70–99)
Potassium: 4.3 mmol/L (ref 3.5–5.1)
Sodium: 139 mmol/L (ref 135–145)
Total Bilirubin: 0.4 mg/dL (ref 0.0–1.2)
Total Protein: 6.6 g/dL (ref 6.5–8.1)

## 2023-09-30 NOTE — Progress Notes (Signed)
 [facility]  [phone] [phone]     ID: Lauren Holt DOB: 18-Jul-1940  MR#: [id number]  JYN#:[id number]  Patient Care Team: Elester Grim, MD as PCP - General (Internal Medicine) Sheryle Donning, MD as PCP - Cardiology (Cardiology) Caralyn Chandler, MD as Consulting Physician (General Surgery) Magrinat, Rozella Cornfield, MD (Inactive) as Consulting Physician (Oncology) Johna Myers, MD as Consulting Physician (Radiation Oncology) OTHER MD:   CHIEF COMPLAINT: Estrogen receptor positive breast cancer (s/p right mastectomy)  CURRENT TREATMENT: tamoxifen   INTERVAL HISTORY: Discussed the use of AI scribe software for clinical note transcription with the patient, who gave verbal consent to proceed.  History of Present Illness Lauren Holt is an 83 year old female with a history of breast cancer who presents for follow-up regarding her medication regimen. She is currently on tamoxifen .  She experiences numbness in her leg over the past few months, which she associates with neuropathy. No history of diabetes, chemotherapy-induced neuropathy, or back injuries. She mentions a rotator cuff issue that could require surgery, but she prefers to avoid it as it is not significantly bothersome.  A bone density scan in March showed osteopenia with a T-score of -2.2. She inquires about treatment options for osteoporosis.  She experiences constipation frequently and has a history of prolapsed rectum surgery about a year ago. She notes that the prolapse has recurred, describing a visible bulge, but she is hesitant to undergo another surgery.  She mentions discomfort with her breast prosthesis, noting that it is hard and uncomfortable, especially when sleeping. She had a mammogram recently, which was normal.    COVID 19 VACCINATION STATUS: Pfizer x3, also had COVID January 2022   HISTORY OF CURRENT ILLNESS: On the original intake note:  Lauren Holt had routine screening mammography on 06/14/2017 showing a possible area of calcifications in the right breast. She underwent unilateral right diagnostic mammography with tomography at [facility] on 06/23/2017 showing: Suspicious calcifications within the inferior right breast, spanning nearly 10 cm, more superior component spanning 4 cm extent. The more superior component may have a small associated mass. Ultrasonography of the right axilla on 07/01/2017 showed normal right axillary lymph nodes.   Accordingly on 06/29/2017 she proceeded to biopsy of the right breast area in question. The pathology from this procedure showed ([id number]): In the right breast lower central, more lateral superior: invasive ductal carcinoma grade I. In the lower central, more medial inferior: Ductal carcinoma in situ, high grade, with calcifications. The  invasive tumor was significant for estrogen receptor, 100% positive and progesterone receptor, 10% positive, both with strong staining intensity. Proliferation marker Ki67 at 5%. HER2  not amplified. The ductal carcinoma in situ was significant for estrogen receptor, 100% positive and progesterone receptor, 80% positive, both with strong staining intensity.   The patient's subsequent history is as detailed below.   PAST MEDICAL HISTORY: Past Medical History:  Diagnosis Date   Arthritis    Family history of adverse reaction to anesthesia    sister--- hard to wake   GAD (generalized anxiety disorder)    Hearing loss, left    pt has but does not wear hearing aid   Heart palpitations    cardiology evaluation by dr b. Veryl Gottron (Holli Lunger 02-02-2022 pt released)  normal cardiac CT, event monitor showed SR PVCs,  chest pain atypicalen she lays down, has not seen anyone for this, feels like a flutter   History of chemotherapy    right breast cancer  11-17-2017  to 04-12-2018   Malignant neoplasm of lower-inner quadrant of right breast of female, estrogen  receptor positive (HCC) 06/2017   oncologist--- dr Arno Bibles;  dx 03/ 2019  IDC,  ER/PR+;   09-24-2017 s/p right mastectomy w/ node dissection's; completed chemo 04-12-2018   Posterior vaginal wall prolapse    SUI (stress urinary incontinence, female)    Vaginal vault prolapse after hysterectomy    Wears glasses    HLD ( but not too high)   PAST SURGICAL HISTORY: Past Surgical History:  Procedure Laterality Date   AMPUTATION Left 08/02/2021   Procedure: AMPUTATION DIGIT;  Surgeon: Brunilda Capra, MD;  Location: [facility] OR;  Service: Orthopedics;  Laterality: Left;   ANTERIOR AND POSTERIOR REPAIR WITH SACROSPINOUS FIXATION N/A 05/21/2022   Procedure: POSTERIOR REPAIR WITH PERINEORRHAPHY AND SACROSPINOUS FIXATION;  Surgeon: Arma Lamp, MD;  Location: [facility];  Service: Gynecology;  Laterality: N/A;  TOTAL TIME REQUESTED IS 1.5 HRS   BREAST RECONSTRUCTION WITH PLACEMENT OF TISSUE EXPANDER AND ALLODERM Right 09/24/2017   Procedure: BREAST RECONSTRUCTION WITH PLACEMENT OF TISSUE EXPANDER AND ALLODERM;  Surgeon: Alger Infield, MD;  Location: [facility] OR;  Service: Plastics;  Laterality: Right;   BREAST REDUCTION SURGERY Left 01/06/2019   Procedure: LEFT REDUCTION  (BREAST);  Surgeon: Alger Infield, MD;  Location: [facility];  Service: Plastics;  Laterality: Left;   CARPAL TUNNEL RELEASE Bilateral 2008   CATARACT EXTRACTION W/ INTRAOCULAR LENS IMPLANT Bilateral 2020   COLONOSCOPY     CYSTOSCOPY N/A 05/21/2022   Procedure: CYSTOSCOPY;  Surgeon: Arma Lamp, MD;  Location: [facility];  Service: Gynecology;  Laterality: N/A;   MASTECTOMY W/ SENTINEL NODE BIOPSY Right 09/24/2017   Procedure: MASTECTOMY WITH SENTINEL LYMPH NODE BIOPSY;  Surgeon: Caralyn Chandler, MD;  Location: [facility] OR;  Service: General;  Laterality: Right;   PORT-A-CATH REMOVAL Left 01/06/2019   Procedure: REMOVAL LEFT CHEST PORT;  Surgeon: Alger Infield, MD;  Location:  [facility];  Service: Plastics;  Laterality: Left;   PORTACATH PLACEMENT Left 11/08/2017   Procedure: INSERTION PORT-A-CATH;  Surgeon: Caralyn Chandler, MD;  Location[facility] OR;  Service: General;  Laterality: Left;   REMOVAL OF TISSUE EXPANDER AND PLACEMENT OF IMPLANT Right 01/06/2019   Procedure: REMOVAL OF RIGHT TISSUE EXPANDER AND PLACEMENT OF IMPLANT;  Surgeon: Alger Infield, MD;  Location: [facility];  Service: Plastics;  Laterality: Right;   TUBAL LIGATION Bilateral    yrs ago   VAGINAL HYSTERECTOMY  02/09/2000   @[facility]  by dr Coy Ditty;    Dr Tommas Fragmin did Anterior Repair and Pubovaginal sling   Partial Hysterectomy without BSO. Because they were too close to the bladder.    FAMILY HISTORY Family History  Problem Relation Age of Onset   Breast cancer Paternal Aunt    Breast cancer Cousin 22   Stroke Mother        caused blindness   Prostate cancer Father 75   Breast cancer Cousin 31   Breast cancer Cousin 25   Breast cancer Cousin 35   Breast cancer Other    The patient's father died at age 43 due to emphysema. The patient's mother is alive at age 39, turning 96 March 2020. The patient has 5 brothers and 3 sisters. There was a paternal great aunt with breast cancer. There was also a paternal cousin with breast cancer.    GYNECOLOGIC HISTORY:  No LMP recorded. Patient has had a hysterectomy. Menarche:  83 years old Age at first live birth: 83 years old The patient is GXP3. She is s/p partial hysterectomy. Both of her ovaries remain. She used oral contraceptives with no complications. She also used HRT for 2 years without complications.     SOCIAL HISTORY:  Asya is now retired. 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 She is divorced.  2 days a week as she works with a older lady for about 4 hours mostly helping with some home issues and driving her around.  Her oldest daughter, Joycelyn Noa, is a  5th Merchant navy officer in [place], [place]. The patient's son, Polly Brink, works in a Naval architect in [place]. The patient's son, Athena Bland, works in Editor, commissioning in [place].  The patient has 4 grandchildren. She currently does not belong to a church.   ADVANCED DIRECTIVES: Not in place.  At the 07/07/2017 visit the patient was given the appropriate documents to complete and notarized at her discretion.   HEALTH MAINTENANCE: Social History   Tobacco Use   Smoking status: Never   Smokeless tobacco: Never   Tobacco comments:    Smoked X 1 year at age 70 (less than ppd)  Vaping Use   Vaping status: Never Used  Substance Use Topics   Alcohol  use: Yes    Comment: social   Drug use: Never     Colonoscopy: 02/2017 at [facility]  PAP:   Bone density: 2018/ osteopenia   No Known Allergies  Current Outpatient Medications  Medication Sig Dispense Refill   ALPRAZolam  (XANAX ) 0.25 MG tablet Take 0.125 mg by mouth 2 (two) times daily as needed for anxiety.      Ascorbic Acid (VITAMIN C) 500 MG CAPS Take 1 capsule by mouth 2 (two) times daily.     Calcium Carb-Cholecalciferol (CALCIUM 600 + D PO) Take 1 tablet by mouth daily.     Carboxymethylcellulose Sodium (DRY EYE RELIEF OP) Apply to eye at bedtime.     Cholecalciferol (VITAMIN D-3) 5000 units TABS Take 1 tablet by mouth 2 (two) times daily.     Diethylpropion HCl CR 75 MG TB24 Take 1 tablet by mouth daily.     diphenhydrAMINE (BENADRYL) 25 MG tablet Take 25 mg by mouth daily as needed for allergies.     estradiol  (ESTRACE ) 0.1 MG/GM vaginal cream Place 0.5g nightly for two weeks then twice a week after 30 g 11   Lutein 20 MG CAPS Take 20 mg by mouth daily.      Multiple Vitamins-Minerals (ICAPS AREDS FORMULA PO) Take 1 capsule by mouth daily.     NON FORMULARY Take 1 Scoop by mouth daily. Collagen powder     Probiotic Product (PROBIOTIC DAILY PO) Take 1 capsule by mouth 3 (three) times a week.     Red Yeast Rice 600 MG CAPS Take 600 mg by mouth  2 (two) times daily.     tamoxifen  (NOLVADEX ) 20 MG tablet TAKE 1 TABLET EVERY DAY 90 tablet 3   Vibegron  75 MG TABS Take 1 tablet (75 mg total) by mouth daily. 30 tablet 5   vitamin E 180 MG (400 UNITS) capsule Take 400 Units by mouth daily.     zinc gluconate 50 MG tablet Take 50 mg by mouth daily as needed (cold prevention).     No current facility-administered medications for this visit.    OBJECTIVE: White woman in no acute distress  Vitals:   09/30/23 1149  BP:  120/62  Pulse: 75  Resp: 17  Temp: 98.2 F (36.8 C)  SpO2: 99%     Body mass index is 29.03 kg/m.   Wt Readings from Last 3 Encounters:  09/30/23 158 lb 11.2 oz (72 kg)  05/21/22 152 lb 4.8 oz (69.1 kg)  04/24/22 153 lb 9.6 oz (69.7 kg)  ECOG FS:1 - Symptomatic but completely ambulatory  Sclerae unicteric, EOMs intact She is status post right mastectomy and reconstruction.  No palpable nodules.  Implant appears smooth and intact.  Left breast with no palpable masses or regional adenopathy.  No cervical adenopathy.  LAB RESULTS:  CMP     Component Value Date/Time   NA 135 11/27/2021 0220   K 4.0 11/27/2021 0220   CL 99 11/27/2021 0220   CO2 29 11/27/2021 0220   GLUCOSE 135 (H) 11/27/2021 0220   BUN 17 11/27/2021 0220   CREATININE 0.74 11/27/2021 0220   CREATININE 0.71 12/15/2017 1049   CALCIUM 9.0 11/27/2021 0220   PROT 7.1 05/30/2020 1235   ALBUMIN 4.0 05/30/2020 1235   AST 20 05/30/2020 1235   AST 15 12/15/2017 1049   ALT 18 05/30/2020 1235   ALT 13 12/15/2017 1049   ALKPHOS 75 05/30/2020 1235   BILITOT 0.4 05/30/2020 1235   BILITOT 0.4 12/15/2017 1049   GFRNONAA >60 11/27/2021 0220   GFRNONAA >60 12/15/2017 1049   GFRAA >60 05/18/2018 1443   GFRAA >60 12/15/2017 1049    No results found for: TOTALPROTELP, ALBUMINELP, A1GS, A2GS, BETS, BETA2SER, GAMS, MSPIKE, SPEI  No results found for: KPAFRELGTCHN, LAMBDASER, KAPLAMBRATIO  Lab Results  Component Value Date   WBC  6.0 11/27/2021   NEUTROABS 2.8 11/27/2021   HGB 13.1 11/27/2021   HCT 39.8 11/27/2021   MCV 98.8 11/27/2021   PLT 234 11/27/2021   No results found for: LABCA2  No components found for: [id number]  No results for input(s): INR in the last 168 hours.  No results found for: LABCA2  No results found for: WUJ811  No results found for: BJY782  No results found for: NFA213  No results found for: CA2729  No components found for: HGQUANT  No results found for: CEA1, CEA / No results found for: CEA1, CEA   No results found for: AFPTUMOR  No results found for: CHROMOGRNA  No results found for: HGBA, HGBA2QUANT, HGBFQUANT, HGBSQUAN (Hemoglobinopathy evaluation)   No results found for: LDH  No results found for: IRON, TIBC, IRONPCTSAT (Iron and TIBC)  No results found for: FERRITIN  Urinalysis    Component Value Date/Time   COLORURINE STRAW (A) 04/12/2018 1426   APPEARANCEUR CLEAR 04/12/2018 1426   LABSPEC 1.003 (L) 04/12/2018 1426   PHURINE 6.0 04/12/2018 1426   GLUCOSEU NEGATIVE 04/12/2018 1426   HGBUR NEGATIVE 04/12/2018 1426   BILIRUBINUR negative 06/30/2022 1147   KETONESUR NEGATIVE 04/12/2018 1426   PROTEINUR Negative 06/30/2022 1147   PROTEINUR NEGATIVE 04/12/2018 1426   UROBILINOGEN 0.2 06/30/2022 1147   NITRITE negative 06/30/2022 1147   NITRITE NEGATIVE 04/12/2018 1426   LEUKOCYTESUR Small (1+) (A) 06/30/2022 1147    STUDIES: No results found.    ELIGIBLE FOR AVAILABLE RESEARCH PROTOCOL: exact sciences study   ASSESSMENT: 83 y.o. [place], [place] woman status post right breast biopsy 07/01/2017 for a clinical mT2 N0, stage IB invasive ductal carcinoma, grade 1, estrogen and progesterone receptor positive, HER-2 nonamplified, with an MIB-105%  (1) a second area in the right breast biopsied 07/01/2017 showed ductal carcinoma in situ, grade 3,  estrogen and progesterone receptor positive.  (2) status post  right mastectomy and sentinel lymph node sampling 09/24/2017 for a pT1b pN0, stage IA invasive ductal carcinoma, grade 1, with negative margins  (a) a total of 5 sentinel lymph nodes were removed  (b) definitive reconstruction with smooth silicone implant placement [Natrelle Inspira Smooth Round Extra Projection 615 implant placed right chest. REF [id number] SN [id number]. Lon 01/06/2019], with benign pathology; simultaneous left breast reduction    (3) The Oncotype DX score was 28, predicting a risk of outside the breast recurrence over the next 9 years of 17% if the patient's only systemic therapy is tamoxifen  for 5 years.  It also predicts a significant benefit from chemotherapy.  (4) cyclophosphamide , methotrexate  and fluorouracil  (CMF) chemotherapy started 11/17/2017, repeated every 21 days x 8, last treatment 04/12/2018  (a) Udenyca  added with cycle 4 and subsequent cycles due to neutropenia  (5) anastrozole  started 08/04/2017, stopped August 2019 (chemotherapy),  resumed FEB 2020, discontinued September 2020 --   (a) bone density 12/07/2016 at Dr Marlys Singh showed a T score of - 1.5 to -2.0  (b) tamoxifen  started March 2022  (6) genetics testing 07/22/2017 through the Common Hereditary Cancer Panel offered by [facility] found no deleterious mutations in APC, ATM, AXIN2, BARD1, BMPR1A, BRCA1, BRCA2, BRIP1, CDH1, CDKN2A (p14ARF), CDKN2A (p16INK4a), CKD4, CHEK2, CTNNA1, DICER1, EPCAM (Deletion/duplication testing only), GREM1 (promoter region deletion/duplication testing only), KIT, MEN1, MLH1, MSH2, MSH3, MSH6, MUTYH, NBN, NF1, NHTL1, PALB2, PDGFRA, PMS2, POLD1, POLE, PTEN, RAD50, RAD51C, RAD51D, SDHB, SDHC, SDHD, SMAD4, SMARCA4. STK11, TP53, TSC1, TSC2, and VHL.  The following genes were evaluated for sequence changes only: SDHA and HOXB13 c.251G>A variant only.   (a) a variant of uncertain significance in the gene DICER1 was identified.  c.2116+6G>A (Intronic)  (7) CT A/P done on 02/11/2018 for LLQ  pain, no cause for pain identified, but two indeterminate liver lesions were noted  (a) MRI liver 05/02/2019 shows no change in the lesion of concern, with a second very small lesion in the inferior tip of the right liver.  (b) repeat liver MRI 03/27/2020 stable and consistent with a benign hemangioma   PLAN:  Assessment and Plan Assessment & Plan Breast cancer, estrogen receptor positive Estrogen receptor positive breast cancer on tamoxifen  since April 2019. Tamoxifen  to continue until December 2025. Discussed Guardant Reveal blood test for recurrence monitoring. - Continue tamoxifen  until December 2025. - Provided information on Guardant Reveal blood test for breast cancer monitoring. - Order liver function test to monitor for tamoxifen -related hepatotoxicity.  Neuropathy Numbness in the leg, possibly neuropathy. No chemotherapy-induced neuropathy, diabetes, or back injuries reported.  Osteopenia Osteopenia confirmed by bone density scan with T-score of -2.2. No current need for osteoporosis medication. - Repeat bone density scan in 2027.  Recurrent rectal prolapse Recurrent rectal prolapse within a year of previous surgery. Current management is conservative.  Total time spent: 30 min *Total Encounter Time as defined by the Centers for Medicare and Medicaid Services includes, in addition to the face-to-face time of a patient visit (documented in the note above) non-face-to-face time: obtaining and reviewing outside history, ordering and reviewing medications, tests or procedures, care coordination (communications with other health care professionals or caregivers) and documentation in the medical record.

## 2024-01-20 ENCOUNTER — Other Ambulatory Visit: Payer: Self-pay

## 2024-01-20 ENCOUNTER — Encounter (HOSPITAL_BASED_OUTPATIENT_CLINIC_OR_DEPARTMENT_OTHER): Payer: Self-pay

## 2024-01-20 ENCOUNTER — Emergency Department (HOSPITAL_BASED_OUTPATIENT_CLINIC_OR_DEPARTMENT_OTHER): Admitting: Radiology

## 2024-01-20 ENCOUNTER — Emergency Department (HOSPITAL_BASED_OUTPATIENT_CLINIC_OR_DEPARTMENT_OTHER)
Admission: EM | Admit: 2024-01-20 | Discharge: 2024-01-20 | Disposition: A | Discharge status: Home / Self Care | Source: Ambulatory Visit | Attending: Emergency Medicine | Admitting: Emergency Medicine

## 2024-01-20 DIAGNOSIS — Z79899 Other long term (current) drug therapy: Secondary | ICD-10-CM | POA: Insufficient documentation

## 2024-01-20 DIAGNOSIS — R002 Palpitations: Secondary | ICD-10-CM | POA: Diagnosis present

## 2024-01-20 DIAGNOSIS — R42 Dizziness and giddiness: Secondary | ICD-10-CM | POA: Diagnosis not present

## 2024-01-20 LAB — COMPREHENSIVE METABOLIC PANEL WITH GFR
ALT: 12 U/L (ref 0–44)
AST: 32 U/L (ref 15–41)
Albumin: 4.3 g/dL (ref 3.5–5.0)
Alkaline Phosphatase: 61 U/L (ref 38–126)
Anion gap: 12 (ref 5–15)
BUN: 12 mg/dL (ref 8–23)
CO2: 24 mmol/L (ref 22–32)
Calcium: 9.3 mg/dL (ref 8.9–10.3)
Chloride: 100 mmol/L (ref 98–111)
Creatinine, Ser: 0.67 mg/dL (ref 0.44–1.00)
GFR, Estimated: >60 mL/min (ref >60)
Glucose, Bld: 109 mg/dL — ABNORMAL HIGH (ref 70–99)
Potassium: 5 mmol/L (ref 3.5–5.1)
Sodium: 136 mmol/L (ref 135–145)
Total Bilirubin: 0.2 mg/dL (ref 0.0–1.2)
Total Protein: 6.8 g/dL (ref 6.5–8.1)

## 2024-01-20 LAB — MAGNESIUM: Magnesium: 2.3 mg/dL (ref 1.7–2.4)

## 2024-01-20 LAB — CBC
HCT: 40.5 % (ref 36.0–46.0)
Hemoglobin: 13.3 g/dL (ref 12.0–15.0)
MCH: 32.4 pg (ref 26.0–34.0)
MCHC: 32.8 g/dL (ref 30.0–36.0)
MCV: 98.5 fL (ref 80.0–100.0)
Platelets: 256 K/uL (ref 150–400)
RBC: 4.11 MIL/uL (ref 3.87–5.11)
RDW: 12.2 % (ref 11.5–15.5)
WBC: 5.2 K/uL (ref 4.0–10.5)
nRBC: 0 % (ref 0.0–0.2)

## 2024-01-20 LAB — TROPONIN T, HIGH SENSITIVITY: Troponin T High Sensitivity: <15 ng/L (ref 0–19)

## 2024-01-20 MED ORDER — ASPIRIN 81 MG PO CHEW
324.0000 mg | CHEWABLE_TABLET | Freq: Once | ORAL | Status: AC
  Administered 2024-01-20: 324 mg via ORAL
  Filled 2024-01-20: qty 4

## 2024-01-20 NOTE — Discharge Instructions (Addendum)
 The test today in the ED were reassuring.  I have placed a referral with a cardiologist for further evaluation as we discussed.  Return to the ED if you have recurrent episodes, chest pain, difficult breathing or other concerning symptoms.

## 2024-01-20 NOTE — ED Notes (Signed)
 Reviewed AVS/discharge instruction with patient. Time allotted for and all questions answered. Patient is agreeable for d/c and escorted to ed exit by staff.

## 2024-01-20 NOTE — ED Triage Notes (Signed)
 Arrives ambulatory to the ED with complaints of intermittent palpitations and dizziness x2 weeks.

## 2024-01-20 NOTE — ED Notes (Signed)
 Patient transported to X-ray

## 2024-01-20 NOTE — ED Provider Notes (Signed)
 [place] EMERGENCY DEPARTMENT AT [facility] Provider Note   CSN: [id number] Arrival date & time: 01/20/24  1645     Patient presents with: Palpitations and Dizziness   Lauren Holt is a 83 y.o. female.  {Add pertinent medical, surgical, social history, OB history to HPI:32947}  Palpitations Associated symptoms: dizziness   Dizziness Associated symptoms: palpitations      Patient has a history of anxiety palpitations.  There is a family history of heart disease.  Patient presents to the ER with complaints of palpitations and feeling lightheaded.  Patient states the symptoms started in the last week or 2.  She has noticed episodes of her heart beating forcefully.  Last night she noticed it more strongly.  She felt somewhat lightheaded and a sense of uneasiness.  Patient also has noticed her blood pressure running higher than normal.  She called her doctor who suggested she come to the ED for evaluation.  Patient denies having any fevers.  No coughing.  No pain in her chest.  Prior to Admission medications   Medication Sig Start Date End Date Taking? Authorizing Provider  ALPRAZolam  (XANAX ) 0.25 MG tablet Take 0.125 mg by mouth 2 (two) times daily as needed for anxiety.  08/03/17   [provider]  Ascorbic Acid (VITAMIN C) 500 MG CAPS Take 1 capsule by mouth 2 (two) times daily.    [provider]  Calcium Carb-Cholecalciferol (CALCIUM 600 + D PO) Take 1 tablet by mouth daily.    [provider]  Carboxymethylcellulose Sodium (DRY EYE RELIEF OP) Apply to eye at bedtime.    [provider]  Cholecalciferol (VITAMIN D-3) 5000 units TABS Take 1 tablet by mouth 2 (two) times daily.    [provider]  Diethylpropion HCl CR 75 MG TB24 Take 1 tablet by mouth daily.    [provider]  diphenhydrAMINE (BENADRYL) 25 MG tablet Take 25 mg by mouth daily as needed for allergies.    [provider]  estradiol  (ESTRACE )  0.1 MG/GM vaginal cream Place 0.5g nightly for two weeks then twice a week after 07/02/22   Marilynne Rosaline SAILOR, MD  Lutein 20 MG CAPS Take 20 mg by mouth daily.     [provider]  Multiple Vitamins-Minerals (ICAPS AREDS FORMULA PO) Take 1 capsule by mouth daily.    [provider]  NON FORMULARY Take 1 Scoop by mouth daily. Collagen powder    [provider]  Probiotic Product (PROBIOTIC DAILY PO) Take 1 capsule by mouth 3 (three) times a week.    [provider]  Red Yeast Rice 600 MG CAPS Take 600 mg by mouth 2 (two) times daily.    [provider]  tamoxifen  (NOLVADEX ) 20 MG tablet TAKE 1 TABLET EVERY DAY 08/27/23   Causey, Morna Pickle, NP  Vibegron  75 MG TABS Take 1 tablet (75 mg total) by mouth daily. 06/30/22   Marilynne Rosaline SAILOR, MD  vitamin E 180 MG (400 UNITS) capsule Take 400 Units by mouth daily.    [provider]  zinc gluconate 50 MG tablet Take 50 mg by mouth daily as needed (cold prevention).    [provider]    Allergies: Patient has no known allergies.    Review of Systems  Cardiovascular:  Positive for palpitations.  Neurological:  Positive for dizziness.    Updated Vital Signs BP (!) 161/67 (BP Location: Left Arm)   Pulse 79   Temp 98.2 F (36.8 C)  Resp 18   Ht 1.575 m (5' 2)   Wt 71.7 kg   SpO2 100%   BMI 28.90 kg/m   Physical Exam Vitals and nursing note reviewed.  Constitutional:      General: She is not in acute distress.    Appearance: She is well-developed.  HENT:     Head: Normocephalic and atraumatic.     Right Ear: External ear normal.     Left Ear: External ear normal.  Eyes:     General: No scleral icterus.       Right eye: No discharge.        Left eye: No discharge.     Conjunctiva/sclera: Conjunctivae normal.  Neck:     Trachea: No tracheal deviation.  Cardiovascular:     Rate and Rhythm: Normal rate and regular rhythm.  Pulmonary:     Effort: Pulmonary  effort is normal. No respiratory distress.     Breath sounds: Normal breath sounds. No stridor. No wheezing or rales.  Abdominal:     General: Bowel sounds are normal. There is no distension.     Palpations: Abdomen is soft.     Tenderness: There is no abdominal tenderness. There is no guarding or rebound.  Musculoskeletal:        General: No tenderness or deformity.     Cervical back: Neck supple.  Skin:    General: Skin is warm and dry.     Findings: No rash.  Neurological:     General: No focal deficit present.     Mental Status: She is alert.     Cranial Nerves: No cranial nerve deficit, dysarthria or facial asymmetry.     Sensory: No sensory deficit.     Motor: No abnormal muscle tone or seizure activity.     Coordination: Coordination normal.  Psychiatric:        Mood and Affect: Mood normal.     (all labs ordered are listed, but only abnormal results are displayed) Labs Reviewed  CBC  COMPREHENSIVE METABOLIC PANEL WITH GFR  MAGNESIUM  CBG MONITORING, ED  TROPONIN T, HIGH SENSITIVITY    EKG: EKG Interpretation Date/Time:  Thursday January 20 2024 16:53:18 EDT Ventricular Rate:  82 PR Interval:  152 QRS Duration:  86 QT Interval:  370 QTC Calculation: 432 R Axis:   33  Text Interpretation: Normal sinus rhythm When compared with ECG of 27-Nov-2021 02:04, No significant change since last tracing Confirmed by Randol Simmonds [phone]) on 01/20/2024 4:56:39 PM  Radiology: No results found.  {Document cardiac monitor, telemetry assessment procedure when appropriate:32947} Procedures   Medications Ordered in the ED  aspirin chewable tablet 324 mg (has no administration in time range)      {Click here for ABCD2, HEART and other calculators REFRESH Note before signing:1}                              Medical Decision Making Amount and/or Complexity of Data Reviewed Labs: ordered. Radiology: ordered.  Risk OTC drugs.   ***  {Document critical care time when  appropriate  Document review of labs and clinical decision tools ie CHADS2VASC2, etc  Document your independent review of radiology images and any outside records  Document your discussion with family members, caretakers and with consultants  Document social determinants of health affecting pt's care  Document your decision making why or why not admission, treatments were needed:32947:::1}   Final diagnoses:  None  ED Discharge Orders     None

## 2024-02-10 ENCOUNTER — Telehealth (HOSPITAL_BASED_OUTPATIENT_CLINIC_OR_DEPARTMENT_OTHER): Payer: Self-pay | Admitting: Cardiology

## 2024-02-10 NOTE — Telephone Encounter (Signed)
 Spoke with the pt.  She was reaching out to get an ER follow-up appt for palpitations, scheduled with our office.   Pt went to the ER on 10/9, and was advised to follow-up outpatient cardiology for palpitations.   Pt states she is still experiencing palpitations.  Scheduled the pt to come into the office to see Reche Finder, NP for tomorrow 10/31 at 2:45 pm.  Pt aware to arrive 15 mins prior to this visit.   Pt verbalized understanding and agrees with this plan.

## 2024-02-10 NOTE — Telephone Encounter (Signed)
 Pt stopped by to schedule an appt, was recently in the ed and would like to be seen sooner than later. Please call pt and advise. Instructions on AVS were to call if not contacted with 72 hrs

## 2024-02-11 ENCOUNTER — Encounter (HOSPITAL_BASED_OUTPATIENT_CLINIC_OR_DEPARTMENT_OTHER): Payer: Self-pay | Admitting: Family

## 2024-02-11 ENCOUNTER — Ambulatory Visit (HOSPITAL_BASED_OUTPATIENT_CLINIC_OR_DEPARTMENT_OTHER): Admitting: Family

## 2024-02-11 VITALS — BP 134/76 | HR 75 | Ht 62.0 in | Wt 162.5 lb

## 2024-02-11 DIAGNOSIS — R002 Palpitations: Secondary | ICD-10-CM | POA: Diagnosis not present

## 2024-02-11 DIAGNOSIS — R0602 Shortness of breath: Secondary | ICD-10-CM

## 2024-02-11 NOTE — Progress Notes (Signed)
 Cardiology Office Note   Date:  02/11/2024  ID:  Lauren Holt, DOB Aug 15, 1940, MRN [id number] PCP: Vernon Velna SAUNDERS, MD  [facility] Providers Cardiologist:  Shelda Bruckner, MD     History of Present Illness Lauren Holt is a 83 y.o. female with history of anxiety, right breast cancer, palpitations, arctic atherosclerosis  Established 12/2021 for evaluation of PVC.  Monitor 01/2022 predominantly normal sinus rhythm, average heart rate 73 bpm, 2 runs of SVT longest 4 beats at 98 bpm and fastest 4 beats 118 bpm.  21 triggered events predominately associate with sinus rhythm or sinus with ectopic beat which occurred less than 1% of the time.  Coronary calcium score 01/2019 23 calcium score of 0 the aortic atherosclerosis noted.  She was last seen 01/2022.  Her LP(a) was unremarkable at less than 8.4.  She was recommended for primary prevention of cardiovascular disease and to follow-up as needed.  ED visit 01/20/2024 with palpitations and feeling lightheaded.  Reported sensation of heart beating forcefully.  She reported elevated blood pressure.  EKG NSR.  No significant arrhythmias noted in the ED.  She was recommended follow-up with cardiology.  Presents today for follow up. Reports continued intermittent palpitations. Around the time of ED visit, noted stress over events presented on the news, which she has avoided watching since. She has no increased caffeine intake, is adequately hydrated. She endorses DOE, when walking up stairs, which is new over the last few weeks. Endorses fatigue which is new. She has had intermittent chest pressure when seated that lasts seconds.   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 Had previously been walking 3x weekly with a friend, not as of late due to friend's injury. Swims during the summer.   She has a family history of heart disease in both parents, brother, and son. Sister  passed of stroke.   ROS: Please see the history of present illness.    All other systems reviewed and are negative.   Studies Reviewed EKG Interpretation Date/Time:  Friday February 11 2024 14:54:25 EDT Ventricular Rate:  75 PR Interval:  154 QRS Duration:  84 QT Interval:  392 QTC Calculation: 437 R Axis:   -2  Text Interpretation: Normal sinus rhythm Poor R wave progression Confirmed by Vannie Mora ([id number]) on 02/11/2024 3:13:01 PM    Cardiac Studies & Procedures   ______________________________________________________________________________________________        SHERRILEE  LONG TERM MONITOR (3-14 DAYS) 01/22/2022  Narrative Patch Wear Time:  13 days and 23 hours  Patient had a min HR of 50 bpm, max HR of 127 bpm, and avg HR of 73 bpm. Predominant underlying rhythm was Sinus Rhythm. 2 Supraventricular Tachycardia runs occurred, the run with the fastest interval lasting 4 beats with a max rate of 118 bpm, the longest lasting 4 beats with an avg rate of 98 bpm. No VT, atrial fibrillation, high degree block, or pauses noted. Isolated atrial and ventricular ectopy was rare (<1%). There were 29 triggered events, predominantly sinus or sinus with ectopic beat.   CT SCANS  CT CARDIAC SCORING (SELF PAY ONLY) 01/29/2022  Addendum 01/30/2022  8:40 AM ADDENDUM REPORT: 01/30/2022 08:38  ADDENDUM: The following report is an over-read performed by radiologist Dr. Reyes Holder of [facility] on January 30, 2022. This over-read does not include interpretation of cardiac or coronary anatomy or pathology. The coronary calcium score interpretation by the cardiologist is attached.  COMPARISON:  None.  FINDINGS: Vascular: No acute non-cardiac vascular finding.  Mediastinum/Nodes: Within the visualized portions of the chest there are no pathologically enlarged mediastinal, or hilar lymph nodes, noting limited sensitivity for the detection of hilar adenopathy  on this noncontrast study. Distal esophagus is grossly unremarkable.  Lungs/Pleura: Tiny clustered and tree-in-bud nodules in the dependent right lower lobe for instance on image 61/2.  Upper Abdomen: Visualized portions of the upper abdomen are unremarkable.  Musculoskeletal: Right breast prosthesis. Multilevel degenerative changes spine.  IMPRESSION: Tiny clustered and tree-in-bud nodules in the dependent right lower lobe, likely reflecting an infectious/inflammatory process including aspiration.   Electronically Signed By: Reyes Holder M.D. On: 01/30/2022 08:38  Narrative CLINICAL DATA:  Cardiovascular Disease Risk stratification  EXAM: Coronary Calcium Score  TECHNIQUE: A gated, non-contrast computed tomography scan of the heart was performed using 3 mm slice thickness. Axial images were analyzed on a dedicated workstation. Calcium scoring of the coronary arteries was performed using the Agatston method.  FINDINGS: Coronary arteries: Normal origins.  Coronary Calcium Score:  Left main: 0  Left anterior descending artery: 0  Left circumflex artery: 0  Right coronary artery: 0  Total: 0  Percentile: 0  Pericardium: Normal.  Aorta: Normal caliber.  Non-cardiac: See separate report from [facility].  IMPRESSION: 1. Coronary calcium score of 0. 2. Aortic atherosclerosis.  RECOMMENDATIONS: Coronary artery calcium (CAC) score is a strong predictor of incident coronary heart disease (CHD) and provides predictive information beyond traditional risk factors. CAC scoring is reasonable to use in the decision to withhold, postpone, or initiate statin therapy in intermediate-risk or selected borderline-risk asymptomatic adults (age 51-75 years and LDL-C >=70 to <190 mg/dL) who do not have diabetes or established atherosclerotic cardiovascular disease (ASCVD).* In intermediate-risk (10-year ASCVD risk >=7.5% to <20%) adults or selected  borderline-risk (10-year ASCVD risk >=5% to <7.5%) adults in whom a CAC score is measured for the purpose of making a treatment decision the following recommendations have been made:  If CAC=0, it is reasonable to withhold statin therapy and reassess in 5 to 10 years, as long as higher risk conditions are absent (diabetes mellitus, family history of premature CHD in first degree relatives (males <55 years; females <65 years), cigarette smoking, or LDL >=190 mg/dL).  If CAC is 1 to 99, it is reasonable to initiate statin therapy for patients >=65 years of age.  If CAC is >=100 or >=75th percentile, it is reasonable to initiate statin therapy at any age.  Cardiology referral should be considered for patients with CAC scores >=400 or >=75th percentile.  *2018 AHA/ACC/AACVPR/AAPA/ABC/ACPM/ADA/AGS/APhA/ASPC/NLA/PCNA Guideline on the Management of Blood Cholesterol: A Report of the American College of Cardiology/American Heart Association Task Force on Clinical Practice Guidelines. J Am Coll Cardiol. 2019;73(24):3168-3209.  Vinie Maxcy, MD  Electronically Signed: By: Vinie JAYSON Maxcy M.D. On: 01/29/2022 20:44     ______________________________________________________________________________________________      Risk Assessment/Calculations           Physical Exam VS:  BP 134/76   Pulse 75   Ht 5' 2 (1.575 m)   Wt 73.7 kg   SpO2 97%   BMI 29.72 kg/m        Wt Readings from Last 3 Encounters:  02/11/24 73.7 kg  01/20/24 71.7 kg  09/30/23 72 kg    GEN: Well nourished, well developed in no acute distress NECK: No JVD; No carotid bruits CARDIAC: RRR, no murmurs, rubs, gallops; radial and PT pulses 2+ bilaterally RESPIRATORY:  Clear to auscultation  without rales, wheezing or rhonchi  ABDOMEN: Soft, non-tender, non-distended EXTREMITIES:  No edema; No deformity   ASSESSMENT AND PLAN  Palpitations - intermittent and persistent, occurring daily without clear  etiology; labs 01/20/24 unremarkable with normal electrolytes, magnesium, Hgb; labs 12/10/23: TSH 1.71; EKG today: SR @ 75bpm; ZIO monitor in 2023 with rare ectopy and avg HR 73bpm. Symptom description suggestive of PVC precipitated by stress. Will order 2 week ZIO to rule out arrhthymias  Will order echocardiogram to rule out structural, valvular abnormalities as cause; no murmur on exam  DOE / fatigue - new symptoms over last several weeks, not lifestyle limiting; strong family history of CAD in both parents and son; 2023 calcium score 0; labs 01/20/24: Hgb 13.3 and 12/10/23: TSH 1.71 Will order echocardiogram - rule out LV dysfunction, valvular abnormalities, wall motion abnormalities as etiology  If WMA present, consider ischemic eval with coronary CT angiogram  Aortic atherosclerosis - noted on 2023 calcium score; LDL 85 (12/10/23), Lpa negative, on RYR. No changes to therapy.        Dispo: 6-8 weeks  Signed, Reche GORMAN Finder, NP

## 2024-02-11 NOTE — Patient Instructions (Addendum)
 Medication Instructions:   NO CHANGES  *If you need a refill on your cardiac medications before your next appointment, please call your pharmacy*  Lab Work:  If you have labs (blood work) drawn today and your tests are completely normal, you will receive your results only by: MyChart Message (if you have MyChart) OR A paper copy in the mail If you have any lab test that is abnormal or we need to change your treatment, we will call you to review the results.  Testing/Procedures: Nuvia Hileman NP has requested that you have an echocardiogram. Echocardiography is a painless test that uses sound waves to create images of your heart. It provides your doctor with information about the size and shape of your heart and how well your heart's chambers and valves are working. This procedure takes approximately one hour. There are no restrictions for this procedure. Please do NOT wear cologne, perfume, aftershave, or lotions (deodorant is allowed). Please arrive 15 minutes prior to your appointment time.  Please note: We ask at that you not bring children with you during ultrasound (echo/ vascular) testing. Due to room size and safety concerns, children are not allowed in the ultrasound rooms during exams. Our front office staff cannot provide observation of children in our lobby area while testing is being conducted. An adult accompanying a patient to their appointment will only be allowed in the ultrasound room at the discretion of the ultrasound technician under special circumstances. We apologize for any inconvenience.  Nasra Counce NP has recommended that you wear a Zio monitor.   This monitor is a medical device that records the heart's electrical activity. Doctors most often use these monitors to diagnose arrhythmias. Arrhythmias are problems with the speed or rhythm of the heartbeat. The monitor is a small device applied to your chest. You can wear one while you do your normal daily activities. While wearing  this monitor if you have any symptoms to push the button and record what you felt. Once you have worn this monitor for the period of time provider prescribed (Usually 14 days), you will return the monitor device in the postage paid box. Once it is returned they will download the data collected and provide us  with a report which the provider will then review and we will call you with those results. Important tips:  Avoid showering during the first 24 hours of wearing the monitor. Avoid excessive sweating to help maximize wear time. Do not submerge the device, no hot tubs, and no swimming pools. Keep any lotions or oils away from the patch. After 24 hours you may shower with the patch on. Take brief showers with your back facing the shower head.  Do not remove patch once it has been placed because that will interrupt data and decrease adhesive wear time. Push the button when you have any symptoms and write down what you were feeling. Once you have completed wearing your monitor, remove and place into box which has postage paid and place in your outgoing mailbox.  If for some reason you have misplaced your box then call our office and we can provide another box and/or mail it off for you.   Follow-Up: At [facility], you and your health needs are our priority.  As part of our continuing mission to provide you with exceptional heart care, our providers are all part of one team.  This team includes your primary Cardiologist (physician) and Advanced Practice Providers or APPs (Physician Assistants and Nurse Practitioners) who all  work together to provide you with the care you need, when you need it.  Your next appointment:      We recommend signing up for the patient portal called MyChart.  Sign up information is provided on this After Visit Summary.  MyChart is used to connect with patients for Virtual Visits (Telemedicine).  Patients are able to view lab/test results, encounter notes,  upcoming appointments, etc.  Non-urgent messages can be sent to your provider as well.   To learn more about what you can do with MyChart, go to [email or web address].   Other Instructions

## 2024-03-13 ENCOUNTER — Ambulatory Visit: Attending: Family

## 2024-03-13 ENCOUNTER — Other Ambulatory Visit (HOSPITAL_BASED_OUTPATIENT_CLINIC_OR_DEPARTMENT_OTHER)

## 2024-03-13 ENCOUNTER — Telehealth: Payer: Self-pay | Admitting: Family

## 2024-03-13 DIAGNOSIS — R0602 Shortness of breath: Secondary | ICD-10-CM

## 2024-03-13 DIAGNOSIS — R002 Palpitations: Secondary | ICD-10-CM

## 2024-03-13 NOTE — Telephone Encounter (Signed)
 Monitor order was entered to be done at [facility].  It did not fall to the [employer] monitor work que to be processed.   Order with be processed to day and patients follow up appointment will be rescheduled for some time after 04/06/24.

## 2024-03-13 NOTE — Progress Notes (Unsigned)
 Monitor order was entered to be done at [facility] and never dropped to the [employer] work queue to be processed. Monitor order processed and patients follow up appointment reschedule to a date when the monitor will be completed.  Enrolled for [employer] to mail a ZIO XT long term holter monitor to the patients address on file.

## 2024-03-13 NOTE — Telephone Encounter (Signed)
 Pt called in stating she has not received heart monitor yet. She asked if someone can provide her an update on it.She still has appt 12/16, she asked if she needs to r/s this? Please advise.

## 2024-03-14 ENCOUNTER — Other Ambulatory Visit (HOSPITAL_BASED_OUTPATIENT_CLINIC_OR_DEPARTMENT_OTHER)

## 2024-03-28 ENCOUNTER — Ambulatory Visit (HOSPITAL_BASED_OUTPATIENT_CLINIC_OR_DEPARTMENT_OTHER): Admitting: Family

## 2024-04-18 ENCOUNTER — Ambulatory Visit (INDEPENDENT_AMBULATORY_CARE_PROVIDER_SITE_OTHER)

## 2024-04-18 ENCOUNTER — Ambulatory Visit (HOSPITAL_BASED_OUTPATIENT_CLINIC_OR_DEPARTMENT_OTHER): Payer: Self-pay | Admitting: Family

## 2024-04-18 DIAGNOSIS — R0602 Shortness of breath: Secondary | ICD-10-CM | POA: Diagnosis not present

## 2024-04-18 DIAGNOSIS — R002 Palpitations: Secondary | ICD-10-CM

## 2024-04-18 LAB — ECHOCARDIOGRAM COMPLETE
Area-P 1/2: 3.65 cm2
S' Lateral: 2.17 cm

## 2024-04-25 ENCOUNTER — Encounter (HOSPITAL_BASED_OUTPATIENT_CLINIC_OR_DEPARTMENT_OTHER): Payer: Self-pay | Admitting: Family

## 2024-04-25 ENCOUNTER — Ambulatory Visit (HOSPITAL_BASED_OUTPATIENT_CLINIC_OR_DEPARTMENT_OTHER): Admitting: Family

## 2024-04-25 VITALS — BP 126/72 | HR 70 | Ht 62.0 in | Wt 160.9 lb

## 2024-04-25 DIAGNOSIS — R002 Palpitations: Secondary | ICD-10-CM | POA: Diagnosis not present

## 2024-04-25 DIAGNOSIS — I493 Ventricular premature depolarization: Secondary | ICD-10-CM | POA: Diagnosis not present

## 2024-04-25 DIAGNOSIS — I700 Atherosclerosis of aorta: Secondary | ICD-10-CM | POA: Diagnosis not present

## 2024-04-25 MED ORDER — METOPROLOL TARTRATE 25 MG PO TABS: ORAL_TABLET | ORAL | 0 refills | Status: AC

## 2024-04-25 NOTE — Patient Instructions (Addendum)
 Medication Instructions:   START Metoprolol  Take one (1) tablet ( 25 mg) up to twice daily for palpitations.  *If you need a refill on your cardiac medications before your next appointment, please call your pharmacy*  Lab Work:  None ordered.  If you have labs (blood work) drawn today and your tests are completely normal, you will receive your results only by: MyChart Message (if you have MyChart) OR A paper copy in the mail If you have any lab test that is abnormal or we need to change your treatment, we will call you to review the results.  Testing/Procedures:  None ordered.  Follow-Up: At [facility], you and your health needs are our priority.  As part of our continuing mission to provide you with exceptional heart care, our providers are all part of one team.  This team includes your primary Cardiologist (physician) and Advanced Practice Providers or APPs (Physician Assistants and Nurse Practitioners) who all work together to provide you with the care you need, when you need it.  Your next appointment:   1 year(s)  Provider:   Shelda Bruckner, MD, Rosaline Bane, NP, or Reche Finder, NP    We recommend signing up for the patient portal called MyChart.  Sign up information is provided on this After Visit Summary.  MyChart is used to connect with patients for Virtual Visits (Telemedicine).  Patients are able to view lab/test results, encounter notes, upcoming appointments, etc.  Non-urgent messages can be sent to your provider as well.   To learn more about what you can do with MyChart, go to [email or web address].   Other Instructions  Your physician wants you to follow-up in: 1 year.  You will receive a reminder letter in the mail two months in advance. If you don't receive a letter, please call our office to schedule the follow-up appointment.            You may try Melatonin 5-10 mg thirty minutes before sleep.   To prevent palpitations: Make  sure you are adequately hydrated.  Avoid and/or limit caffeine containing beverages like soda or tea. Exercise regularly.  Manage stress well. Some over the counter medications can cause palpitations such as Benadryl, AdvilPM, TylenolPM. Regular Advil  or Tylenol  do not cause palpitations.

## 2024-04-25 NOTE — Progress Notes (Unsigned)
 " Cardiology Office Note   Date:  04/25/2024  ID:  Lauren Holt, DOB Nov 16, 1940, MRN [id number] PCP: Lauren Velna SAUNDERS, MD  [facility] Providers Cardiologist:  Shelda Bruckner, MD     History of Present Illness Lauren Holt is a 84 y.o. female with history of anxiety, right breast cancer, palpitations, arctic atherosclerosis  Established 12/2021 for evaluation of PVC.  Monitor 01/2022 predominantly normal sinus rhythm, average heart rate 73 bpm, 2 runs of SVT longest 4 beats at 98 bpm and fastest 4 beats 118 bpm.  21 triggered events predominately associate with sinus rhythm or sinus with ectopic beat which occurred less than 1% of the time.  Coronary calcium score 01/2019 23 calcium score of 0 the aortic atherosclerosis noted.  She was last seen 01/2022.  Her LP(a) was unremarkable at less than 8.4.  She was recommended for primary prevention of cardiovascular disease and to follow-up as needed.  ED visit 01/20/2024 with palpitations and feeling lightheaded.  Reported sensation of heart beating forcefully.  She reported elevated blood pressure.  EKG NSR.  No significant arrhythmias noted in the ED.  She was recommended follow-up with cardiology.  Presents today for follow up. Reports continued intermittent palpitations. Around the time of ED visit, noted stress over events presented on the news, which she has avoided watching since. She has no increased caffeine intake, is adequately hydrated. She endorses DOE, when walking up stairs, which is new over the last few weeks. Endorses fatigue which is new. She has had intermittent chest pressure when seated that lasts seconds.   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 Had previously been walking 3x weekly with a friend, not as of late due to friend's injury. Swims during the summer.   She has a family history of heart disease in both parents, brother, and son. Sister  passed of stroke.   ***  Intermittent palpitations more often with stress  Uses half tablet of Xanax  PRN for lseep. More recently difficulty with sleep  Exertional dyspnea improved from ****  Energy level improved when she is sleeping well.   ROS: Please see the history of present illness.    All other systems reviewed and are negative.   Studies Reviewed         Risk Assessment/Calculations           Physical Exam VS:  BP 126/72 (BP Location: Right Arm, Patient Position: Sitting, Cuff Size: Normal)   Pulse 70   Ht 5' 2 (1.575 m)   Wt 160 lb 14.4 oz (73 kg)   SpO2 96%   BMI 29.43 kg/m        Wt Readings from Last 3 Encounters:  04/25/24 160 lb 14.4 oz (73 kg)  02/11/24 162 lb 8 oz (73.7 kg)  01/20/24 158 lb (71.7 kg)    GEN: Well nourished, well developed in no acute distress NECK: No JVD; No carotid bruits CARDIAC: RRR, no murmurs, rubs, gallops; radial and PT pulses 2+ bilaterally RESPIRATORY:  Clear to auscultation without rales, wheezing or rhonchi  ABDOMEN: Soft, non-tender, non-distended EXTREMITIES:  No edema; No deformity   ASSESSMENT AND PLAN  Palpitations - intermittent often associated with stress. ZIO monitor12/2025 pVC <1%tr associated with triggered episode. *** Will order 2 week ZIO to rule out arrhthymias  Will order echocardiogram to rule out structural, valvular abnormalities as cause; no murmur on exam  DOE / fatigue -  2023 calcium score 0;  labs 01/20/24: Hgb 13.3 and 12/10/23: TSH 1.71. echo *** normal LVEF. DOE and fatigue improved, no indication for further workup Aortic atherosclerosis - noted on 2023 calcium score; LDL 85 (12/10/23), Lpa negative, on RYR. No changes to therapy.        Dispo: 6-8 weeks  Signed, Reche GORMAN Finder, NP  "

## 2024-04-26 ENCOUNTER — Encounter (HOSPITAL_BASED_OUTPATIENT_CLINIC_OR_DEPARTMENT_OTHER): Payer: Self-pay | Admitting: Family

## 2024-05-01 DIAGNOSIS — R002 Palpitations: Secondary | ICD-10-CM

## 2024-05-01 DIAGNOSIS — R0602 Shortness of breath: Secondary | ICD-10-CM | POA: Diagnosis not present

## 2024-09-29 ENCOUNTER — Inpatient Hospital Stay: Admitting: Hematology and Oncology
